# Patient Record
Sex: Female | Born: 1950
Health system: Southern US, Community
[De-identification: ages and names within clinical notes are randomized; demographics above are authoritative.]

## PROBLEM LIST (undated history)

## (undated) DIAGNOSIS — T8140XA Infection following a procedure, unspecified, initial encounter: Secondary | ICD-10-CM

## (undated) DIAGNOSIS — K6389 Other specified diseases of intestine: Secondary | ICD-10-CM

## (undated) DIAGNOSIS — I10 Essential (primary) hypertension: Secondary | ICD-10-CM

## (undated) DIAGNOSIS — M1990 Unspecified osteoarthritis, unspecified site: Secondary | ICD-10-CM

## (undated) DIAGNOSIS — IMO0002 Reserved for concepts with insufficient information to code with codable children: Secondary | ICD-10-CM

## (undated) DIAGNOSIS — E785 Hyperlipidemia, unspecified: Secondary | ICD-10-CM

## (undated) DIAGNOSIS — R791 Abnormal coagulation profile: Secondary | ICD-10-CM

## (undated) DIAGNOSIS — R1900 Intra-abdominal and pelvic swelling, mass and lump, unspecified site: Secondary | ICD-10-CM

## (undated) DIAGNOSIS — E1165 Type 2 diabetes mellitus with hyperglycemia: Secondary | ICD-10-CM

## (undated) DIAGNOSIS — I2510 Atherosclerotic heart disease of native coronary artery without angina pectoris: Secondary | ICD-10-CM

## (undated) DIAGNOSIS — Z95828 Presence of other vascular implants and grafts: Secondary | ICD-10-CM

## (undated) DIAGNOSIS — K219 Gastro-esophageal reflux disease without esophagitis: Secondary | ICD-10-CM

## (undated) HISTORY — DX: Atherosclerotic heart disease of native coronary artery without angina pectoris: I25.10

## (undated) HISTORY — DX: Infection following a procedure, unspecified, initial encounter: T81.40XA

## (undated) HISTORY — DX: Type 2 diabetes mellitus with hyperglycemia: E11.65

## (undated) HISTORY — DX: Intra-abdominal and pelvic swelling, mass and lump, unspecified site: R19.00

## (undated) HISTORY — DX: Unspecified osteoarthritis, unspecified site: M19.90

## (undated) HISTORY — DX: Essential (primary) hypertension: I10

## (undated) HISTORY — DX: Reserved for concepts with insufficient information to code with codable children: IMO0002

## (undated) HISTORY — DX: Other specified diseases of intestine: K63.89

## (undated) HISTORY — DX: Abnormal coagulation profile: R79.1

## (undated) HISTORY — DX: Hyperlipidemia, unspecified: E78.5

## (undated) HISTORY — DX: Presence of other vascular implants and grafts: Z95.828

## (undated) HISTORY — DX: Gastro-esophageal reflux disease without esophagitis: K21.9

---

## 1991-09-29 HISTORY — PX: TOTAL ABDOMINAL HYSTERECTOMY: SHX209

## 2004-08-26 ENCOUNTER — Ambulatory Visit (HOSPITAL_COMMUNITY): Admission: RE | Admit: 2004-08-26 | Discharge: 2004-08-26 | Payer: Self-pay | Admitting: Pulmonary Disease

## 2004-10-24 ENCOUNTER — Encounter: Admission: RE | Admit: 2004-10-24 | Discharge: 2004-10-24 | Payer: Self-pay | Admitting: Pulmonary Disease

## 2005-01-21 ENCOUNTER — Emergency Department (HOSPITAL_COMMUNITY): Admission: EM | Admit: 2005-01-21 | Discharge: 2005-01-21 | Payer: Self-pay | Admitting: Emergency Medicine

## 2005-07-22 ENCOUNTER — Ambulatory Visit (HOSPITAL_COMMUNITY): Admission: RE | Admit: 2005-07-22 | Discharge: 2005-07-22 | Payer: Self-pay | Admitting: Pulmonary Disease

## 2005-07-24 ENCOUNTER — Ambulatory Visit (HOSPITAL_COMMUNITY): Admission: RE | Admit: 2005-07-24 | Discharge: 2005-07-24 | Payer: Self-pay | Admitting: Pulmonary Disease

## 2006-11-25 ENCOUNTER — Encounter: Admission: RE | Admit: 2006-11-25 | Discharge: 2006-11-25 | Payer: Self-pay | Admitting: Pulmonary Disease

## 2006-11-29 ENCOUNTER — Encounter (INDEPENDENT_AMBULATORY_CARE_PROVIDER_SITE_OTHER): Payer: Self-pay | Admitting: Specialist

## 2006-11-29 ENCOUNTER — Ambulatory Visit (HOSPITAL_COMMUNITY): Admission: RE | Admit: 2006-11-29 | Discharge: 2006-11-29 | Payer: Self-pay | Admitting: *Deleted

## 2007-02-11 ENCOUNTER — Emergency Department (HOSPITAL_COMMUNITY): Admission: EM | Admit: 2007-02-11 | Discharge: 2007-02-11 | Payer: Self-pay | Admitting: Emergency Medicine

## 2007-04-08 ENCOUNTER — Ambulatory Visit (HOSPITAL_COMMUNITY): Admission: RE | Admit: 2007-04-08 | Discharge: 2007-04-08 | Payer: Self-pay | Admitting: *Deleted

## 2007-10-31 ENCOUNTER — Ambulatory Visit: Payer: Self-pay

## 2007-11-02 ENCOUNTER — Encounter: Payer: Self-pay | Admitting: Cardiology

## 2007-11-27 ENCOUNTER — Encounter: Payer: Self-pay | Admitting: Cardiology

## 2007-12-07 ENCOUNTER — Ambulatory Visit: Payer: Self-pay | Admitting: Internal Medicine

## 2007-12-07 DIAGNOSIS — E785 Hyperlipidemia, unspecified: Secondary | ICD-10-CM

## 2007-12-07 DIAGNOSIS — I10 Essential (primary) hypertension: Secondary | ICD-10-CM

## 2007-12-07 DIAGNOSIS — K219 Gastro-esophageal reflux disease without esophagitis: Secondary | ICD-10-CM

## 2007-12-07 DIAGNOSIS — E1169 Type 2 diabetes mellitus with other specified complication: Secondary | ICD-10-CM | POA: Insufficient documentation

## 2007-12-07 DIAGNOSIS — E1165 Type 2 diabetes mellitus with hyperglycemia: Secondary | ICD-10-CM

## 2007-12-07 DIAGNOSIS — I2510 Atherosclerotic heart disease of native coronary artery without angina pectoris: Secondary | ICD-10-CM

## 2007-12-07 DIAGNOSIS — I25729 Atherosclerosis of autologous artery coronary artery bypass graft(s) with unspecified angina pectoris: Secondary | ICD-10-CM | POA: Insufficient documentation

## 2007-12-07 DIAGNOSIS — E1159 Type 2 diabetes mellitus with other circulatory complications: Secondary | ICD-10-CM | POA: Insufficient documentation

## 2007-12-14 ENCOUNTER — Encounter: Payer: Self-pay | Admitting: Internal Medicine

## 2007-12-23 ENCOUNTER — Encounter: Payer: Self-pay | Admitting: Internal Medicine

## 2007-12-28 ENCOUNTER — Encounter: Payer: Self-pay | Admitting: Cardiology

## 2007-12-29 ENCOUNTER — Encounter: Payer: Self-pay | Admitting: Internal Medicine

## 2008-01-02 ENCOUNTER — Encounter: Payer: Self-pay | Admitting: Internal Medicine

## 2008-01-17 ENCOUNTER — Ambulatory Visit: Payer: Self-pay | Admitting: Internal Medicine

## 2008-01-17 LAB — CONVERTED CEMR LAB
ALT: 27 units/L (ref 0–35)
AST: 22 units/L (ref 0–37)
Albumin: 3.9 g/dL (ref 3.5–5.2)
BUN: 9 mg/dL (ref 6–23)
Basophils Absolute: 0 10*3/uL (ref 0.0–0.1)
Basophils Relative: 0 % (ref 0.0–1.0)
Calcium: 9.4 mg/dL (ref 8.4–10.5)
Cholesterol: 108 mg/dL (ref 0–200)
Creatinine, Ser: 0.7 mg/dL (ref 0.4–1.2)
Creatinine,U: 128.1 mg/dL
Eosinophils Absolute: 0.1 10*3/uL (ref 0.0–0.7)
Eosinophils Relative: 2.3 % (ref 0.0–5.0)
GFR calc non Af Amer: 91 mL/min
HCT: 44.3 % (ref 36.0–46.0)
Hemoglobin: 14.9 g/dL (ref 12.0–15.0)
Hgb A1c MFr Bld: 7.2 % — ABNORMAL HIGH (ref 4.6–6.0)
MCHC: 33.5 g/dL (ref 30.0–36.0)
MCV: 87.9 fL (ref 78.0–100.0)
Microalb, Ur: 3.6 mg/dL — ABNORMAL HIGH (ref 0.0–1.9)
Neutro Abs: 2.8 10*3/uL (ref 1.4–7.7)
RBC: 5.04 M/uL (ref 3.87–5.11)
TSH: 1.67 microintl units/mL (ref 0.35–5.50)
Total Bilirubin: 0.8 mg/dL (ref 0.3–1.2)
VLDL: 18 mg/dL (ref 0–40)
WBC: 4.6 10*3/uL (ref 4.5–10.5)

## 2008-01-26 ENCOUNTER — Ambulatory Visit: Payer: Self-pay | Admitting: Internal Medicine

## 2008-01-27 ENCOUNTER — Encounter: Payer: Self-pay | Admitting: Cardiology

## 2008-02-22 ENCOUNTER — Telehealth: Payer: Self-pay | Admitting: Internal Medicine

## 2008-02-28 ENCOUNTER — Encounter: Payer: Self-pay | Admitting: Internal Medicine

## 2008-06-05 ENCOUNTER — Ambulatory Visit: Payer: Self-pay | Admitting: Internal Medicine

## 2008-06-05 LAB — CONVERTED CEMR LAB
BUN: 11 mg/dL (ref 6–23)
CO2: 30 meq/L (ref 19–32)
Calcium: 9 mg/dL (ref 8.4–10.5)
Chloride: 108 meq/L (ref 96–112)
Creatinine, Ser: 0.7 mg/dL (ref 0.4–1.2)
Creatinine,U: 224 mg/dL
Hgb A1c MFr Bld: 6.6 % — ABNORMAL HIGH (ref 4.6–6.0)

## 2008-06-11 ENCOUNTER — Ambulatory Visit: Payer: Self-pay | Admitting: Internal Medicine

## 2008-06-11 DIAGNOSIS — R51 Headache: Secondary | ICD-10-CM

## 2008-06-11 DIAGNOSIS — R519 Headache, unspecified: Secondary | ICD-10-CM | POA: Insufficient documentation

## 2008-06-11 LAB — HM DIABETES FOOT EXAM

## 2008-07-06 ENCOUNTER — Encounter: Payer: Self-pay | Admitting: Internal Medicine

## 2008-07-19 ENCOUNTER — Ambulatory Visit: Payer: Self-pay | Admitting: Internal Medicine

## 2008-07-29 DIAGNOSIS — K6389 Other specified diseases of intestine: Secondary | ICD-10-CM

## 2008-07-29 HISTORY — PX: ABDOMINAL MASS RESECTION: SHX1110

## 2008-07-29 HISTORY — DX: Other specified diseases of intestine: K63.89

## 2008-08-12 ENCOUNTER — Encounter: Payer: Self-pay | Admitting: Internal Medicine

## 2008-08-16 ENCOUNTER — Ambulatory Visit: Payer: Self-pay | Admitting: Internal Medicine

## 2008-08-16 ENCOUNTER — Inpatient Hospital Stay (HOSPITAL_COMMUNITY): Admission: AD | Admit: 2008-08-16 | Discharge: 2008-09-13 | Payer: Self-pay | Admitting: Internal Medicine

## 2008-08-16 ENCOUNTER — Ambulatory Visit: Payer: Self-pay | Admitting: Cardiology

## 2008-08-16 DIAGNOSIS — R109 Unspecified abdominal pain: Secondary | ICD-10-CM | POA: Insufficient documentation

## 2008-08-22 ENCOUNTER — Encounter: Payer: Self-pay | Admitting: Internal Medicine

## 2008-08-22 ENCOUNTER — Encounter (INDEPENDENT_AMBULATORY_CARE_PROVIDER_SITE_OTHER): Payer: Self-pay | Admitting: General Surgery

## 2008-09-05 ENCOUNTER — Encounter: Payer: Self-pay | Admitting: Internal Medicine

## 2008-09-08 ENCOUNTER — Ambulatory Visit: Payer: Self-pay | Admitting: Internal Medicine

## 2008-09-10 ENCOUNTER — Encounter: Payer: Self-pay | Admitting: Internal Medicine

## 2008-09-11 ENCOUNTER — Telehealth: Payer: Self-pay | Admitting: Internal Medicine

## 2008-09-19 ENCOUNTER — Encounter: Payer: Self-pay | Admitting: Internal Medicine

## 2008-09-28 ENCOUNTER — Encounter: Payer: Self-pay | Admitting: Internal Medicine

## 2008-10-16 ENCOUNTER — Ambulatory Visit: Payer: Self-pay | Admitting: Internal Medicine

## 2008-10-16 DIAGNOSIS — T8140XA Infection following a procedure, unspecified, initial encounter: Secondary | ICD-10-CM

## 2008-10-16 DIAGNOSIS — R1900 Intra-abdominal and pelvic swelling, mass and lump, unspecified site: Secondary | ICD-10-CM

## 2008-10-16 LAB — CONVERTED CEMR LAB
BUN: 17 mg/dL (ref 6–23)
Basophils Relative: 0.9 % (ref 0.0–3.0)
Calcium: 8.7 mg/dL (ref 8.4–10.5)
Creatinine, Ser: 0.5 mg/dL (ref 0.4–1.2)
Eosinophils Absolute: 0.1 10*3/uL (ref 0.0–0.7)
Eosinophils Relative: 2 % (ref 0.0–5.0)
GFR calc Af Amer: 162 mL/min
GFR calc non Af Amer: 134 mL/min
Glucose, Bld: 120 mg/dL — ABNORMAL HIGH (ref 70–99)
HCT: 34 % — ABNORMAL LOW (ref 36.0–46.0)
Hemoglobin: 11.3 g/dL — ABNORMAL LOW (ref 12.0–15.0)
MCV: 89.9 fL (ref 78.0–100.0)
Monocytes Absolute: 0.4 10*3/uL (ref 0.1–1.0)
Monocytes Relative: 7.6 % (ref 3.0–12.0)
Neutro Abs: 3 10*3/uL (ref 1.4–7.7)
WBC: 4.8 10*3/uL (ref 4.5–10.5)

## 2008-10-17 ENCOUNTER — Encounter: Payer: Self-pay | Admitting: Internal Medicine

## 2008-10-17 ENCOUNTER — Telehealth: Payer: Self-pay | Admitting: Internal Medicine

## 2008-10-29 DIAGNOSIS — Z95828 Presence of other vascular implants and grafts: Secondary | ICD-10-CM

## 2008-10-29 HISTORY — DX: Presence of other vascular implants and grafts: Z95.828

## 2008-11-05 ENCOUNTER — Encounter: Payer: Self-pay | Admitting: Internal Medicine

## 2008-11-20 ENCOUNTER — Ambulatory Visit: Payer: Self-pay | Admitting: Internal Medicine

## 2008-11-20 DIAGNOSIS — R509 Fever, unspecified: Secondary | ICD-10-CM

## 2008-11-30 ENCOUNTER — Telehealth: Payer: Self-pay | Admitting: Internal Medicine

## 2008-12-10 ENCOUNTER — Encounter: Payer: Self-pay | Admitting: Internal Medicine

## 2008-12-17 ENCOUNTER — Ambulatory Visit: Payer: Self-pay | Admitting: Internal Medicine

## 2008-12-17 DIAGNOSIS — R112 Nausea with vomiting, unspecified: Secondary | ICD-10-CM | POA: Insufficient documentation

## 2008-12-17 DIAGNOSIS — R233 Spontaneous ecchymoses: Secondary | ICD-10-CM | POA: Insufficient documentation

## 2008-12-17 LAB — CONVERTED CEMR LAB
BUN: 8 mg/dL (ref 6–23)
Calcium: 9.5 mg/dL (ref 8.4–10.5)
Eosinophils Absolute: 0.2 10*3/uL (ref 0.0–0.7)
Eosinophils Relative: 3.7 % (ref 0.0–5.0)
GFR calc non Af Amer: 131.44 mL/min (ref >60)
Glucose, Bld: 103 mg/dL — ABNORMAL HIGH (ref 70–99)
HCT: 39.9 % (ref 36.0–46.0)
Lymphs Abs: 1.5 10*3/uL (ref 0.7–4.0)
MCHC: 33.4 g/dL (ref 30.0–36.0)
MCV: 85.9 fL (ref 78.0–100.0)
Monocytes Absolute: 0.3 10*3/uL (ref 0.1–1.0)
Platelets: 208 10*3/uL (ref 150.0–400.0)
Prothrombin Time: 10.3 s — ABNORMAL LOW (ref 10.9–13.3)
RDW: 13.3 % (ref 11.5–14.6)
Sodium: 143 meq/L (ref 135–145)
WBC: 4.3 10*3/uL — ABNORMAL LOW (ref 4.5–10.5)
aPTT: 34.3 s — ABNORMAL HIGH (ref 21.7–28.8)

## 2008-12-18 ENCOUNTER — Telehealth: Payer: Self-pay | Admitting: Internal Medicine

## 2008-12-18 DIAGNOSIS — R791 Abnormal coagulation profile: Secondary | ICD-10-CM | POA: Insufficient documentation

## 2008-12-19 ENCOUNTER — Encounter: Payer: Self-pay | Admitting: Internal Medicine

## 2008-12-20 ENCOUNTER — Ambulatory Visit: Payer: Self-pay | Admitting: Internal Medicine

## 2008-12-25 ENCOUNTER — Telehealth: Payer: Self-pay | Admitting: Internal Medicine

## 2009-01-04 ENCOUNTER — Encounter: Payer: Self-pay | Admitting: Internal Medicine

## 2009-01-07 ENCOUNTER — Ambulatory Visit: Payer: Self-pay | Admitting: Internal Medicine

## 2009-01-16 ENCOUNTER — Telehealth: Payer: Self-pay | Admitting: Internal Medicine

## 2009-04-11 ENCOUNTER — Telehealth: Payer: Self-pay | Admitting: Internal Medicine

## 2009-11-24 IMAGING — CT CT ABDOMEN W/ CM
2 of 6 series · 16 of 46 positions shown, 18 images · IV contrast (water/omni  & 80 ml omni 300)
Comparison: 08/16/2008.

CT ABDOMEN

CLINICAL DATA: Fever.  Abdominal pain.  Possible sepsis.
Obstruction.  History of surgery 08/22/2008.

CT ABDOMEN AND PELVIS WITH CONTRAST
TECHNIQUE: Multidetector CT imaging of the abdomen and pelvis was
performed using the standard protocol following bolus
administration of intravenous contrast.
Contrast: 80 ml [id number]-I44.  Oral contrast was administered via
nasogastric tube.

[Series 4: recon 3: routine abdomen · axial · 0.68mm/px · z∈[-413,-23]mm · 13 of 344 slices shown, 15 images]
[im 16/344  soft-tissue]
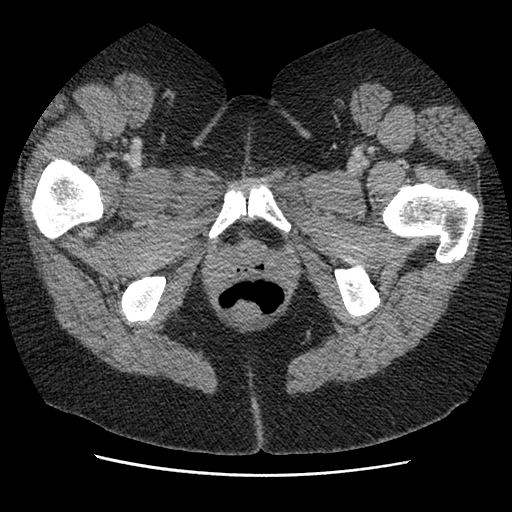
[im 16/344  bone]
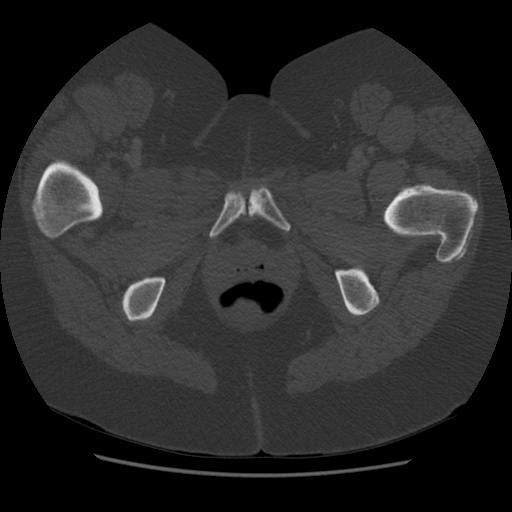
[im 47/344  soft-tissue]
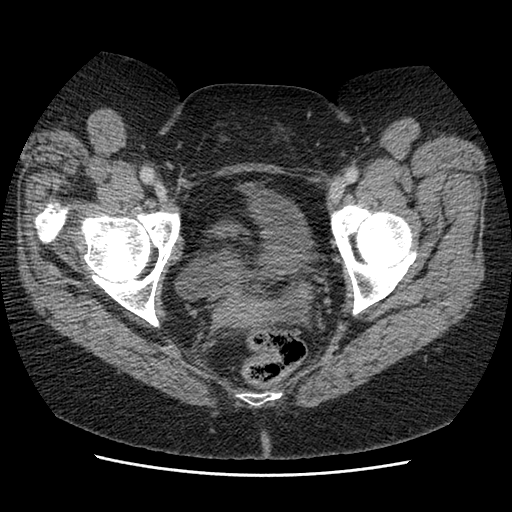
[im 78/344  soft-tissue]
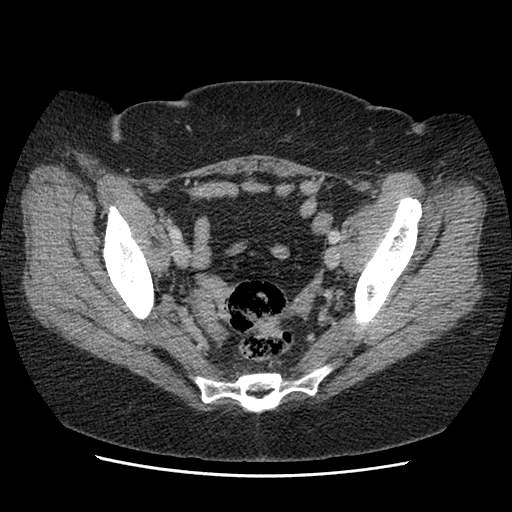
[im 94/344  soft-tissue]
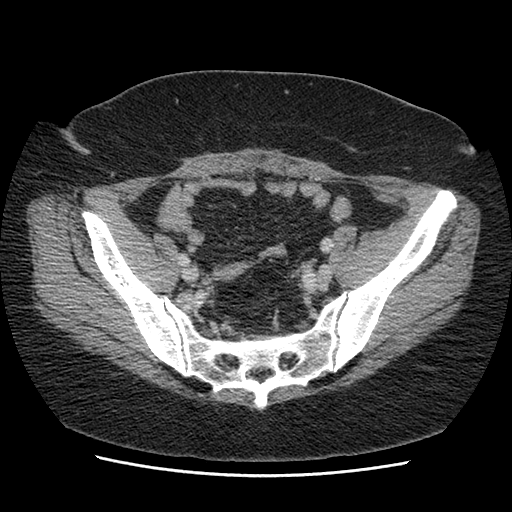
[im 125/344  soft-tissue]
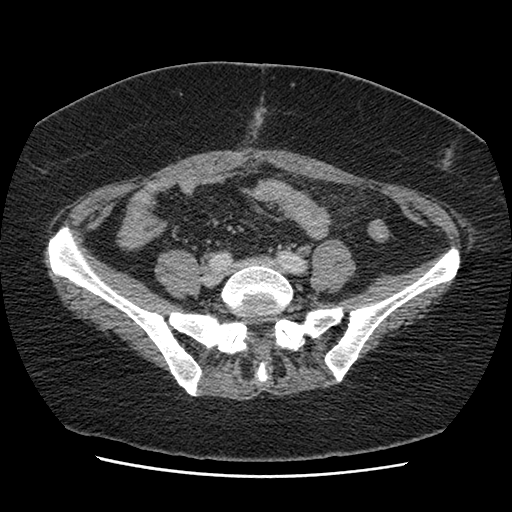
[im 141/344  soft-tissue]
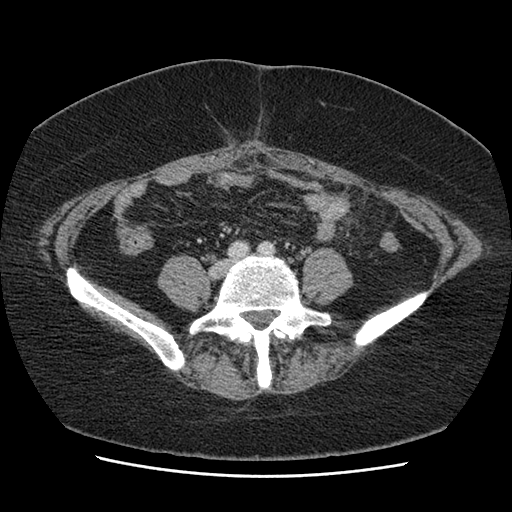
[im 172/344  soft-tissue]
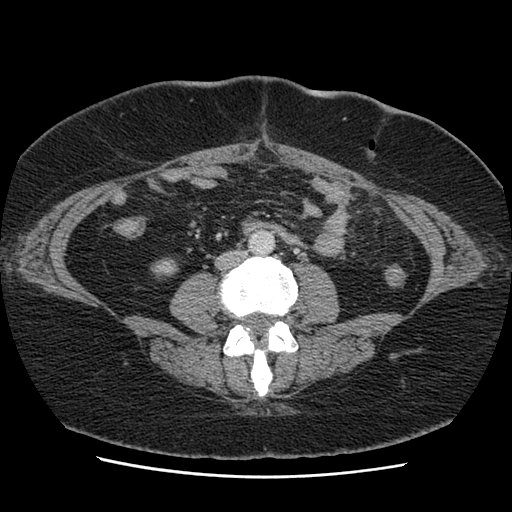
[im 203/344  soft-tissue]
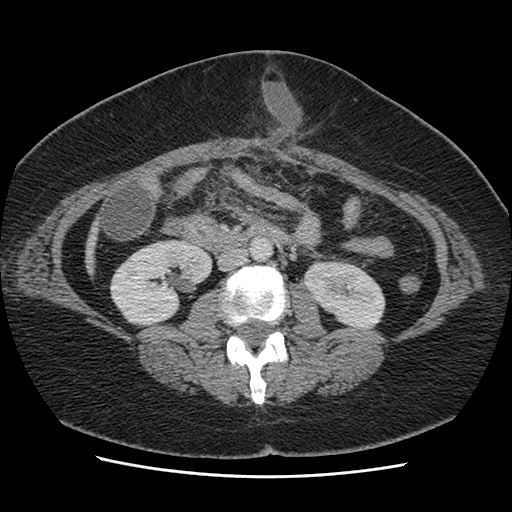
[im 219/344  soft-tissue]
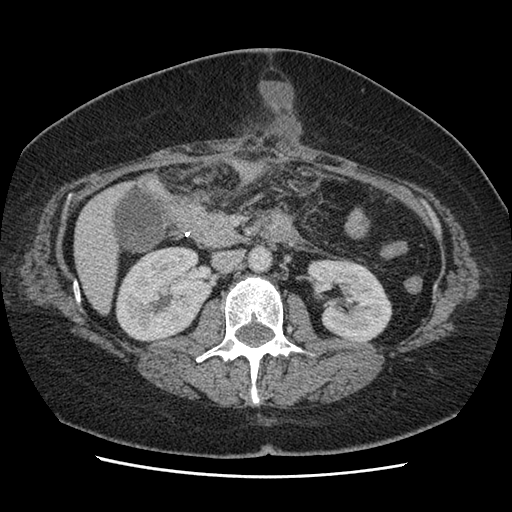
[im 219/344  bone]
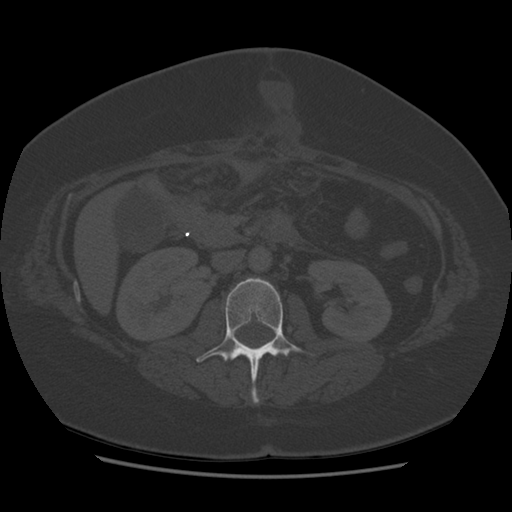
[im 250/344  soft-tissue]
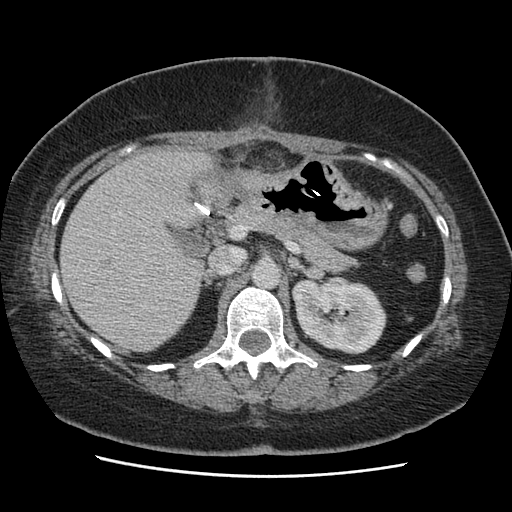
[im 266/344  soft-tissue]
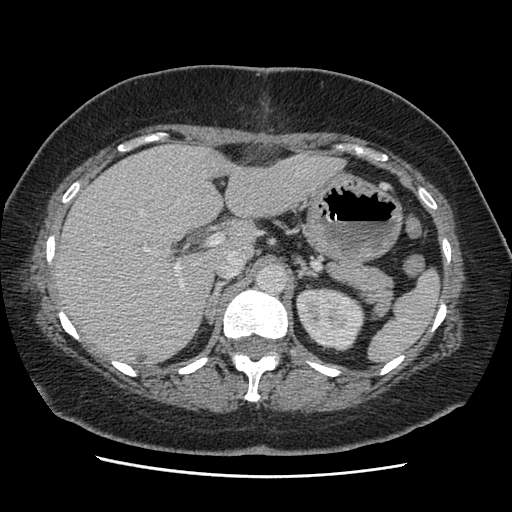
[im 297/344  soft-tissue]
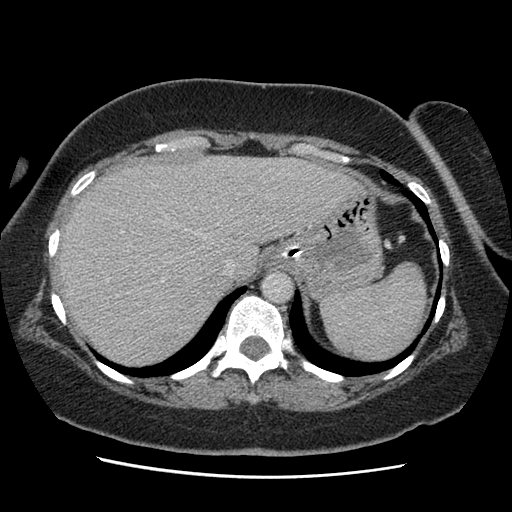
[im 328/344  soft-tissue]
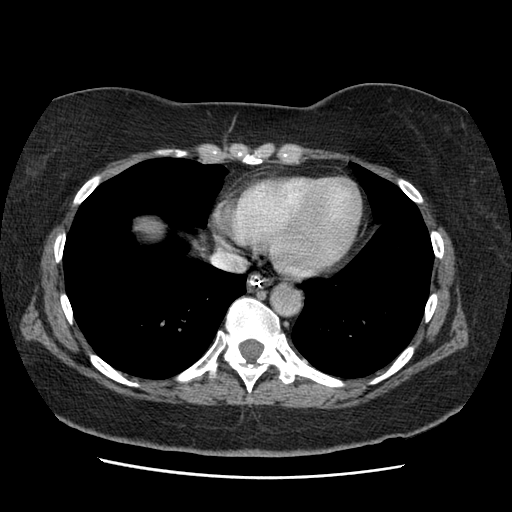

[Series 401: cor · coronal · 0.90mm/px · 3 of 93 slices shown]
[im 31/93  soft-tissue]
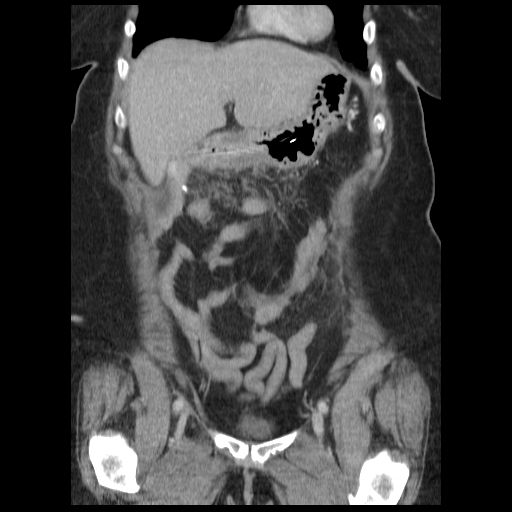
[im 41/93  soft-tissue]
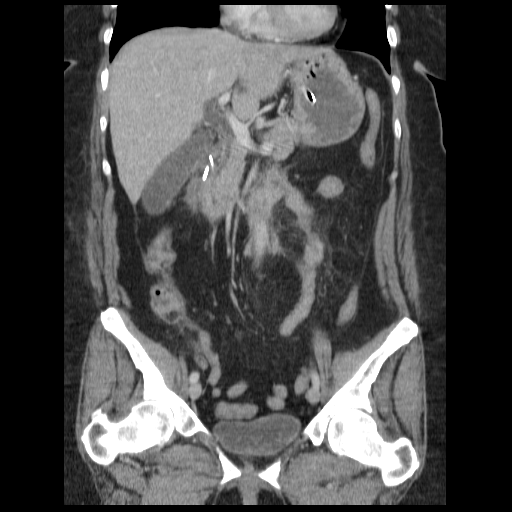
[im 52/93  soft-tissue]
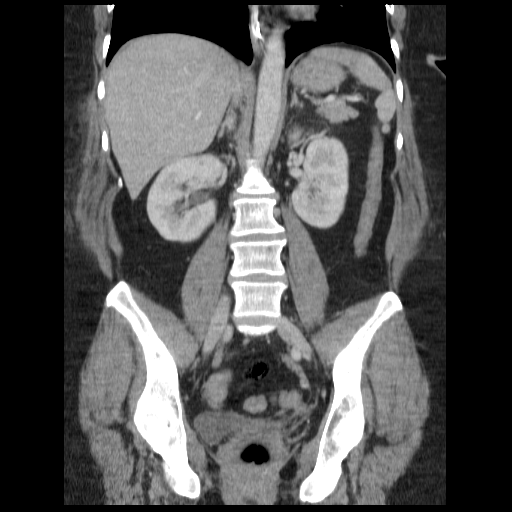

[16 of 46 positions shown; findings below may reference images not displayed]

FINDINGS: Lung bases demonstrate minimal subsegmental atelectasis
in the right lower lobe.  The liver appears within normal limits.
Nasogastric tube is present within the antrum of the stomach.
There appears to be anastomosis between the antrum of the stomach
and small bowel.  Surgical clips are associated with the second
part of the duodenum.

1.2 x 1.3 cm low density lesion is present in the posterior right
hepatic lobe on image number 22, slightly more prominent than on
preoperative exam likely due to technique.

Kidneys show normal enhancement and excretion.  The inflammatory
changes are present in the surgical bed and anterior abdominal fat.
Minim mild inflammatory stranding in the omentum.  No focal fluid
collection or abscess intra-abdominally.

5 cm AP by 2.5 cm fluid collection in the anterior subcutaneous fat
consistent with postoperative seroma.

There is been resection of the bowel mass seen on preoperative the
CT.  Stable small left adrenal nodule.  No free air is present in
the abdomen.
IMPRESSION: 1.  Postsurgical changes involving the distal stomach most
consistent with Billroth procedure or Roux en Y gastrojejunostomy.
2.  Small anterior abdominal wall subcutaneous fluid collection
likely represents seroma.
3.  Resection of small bowel mass seen on preoperative CT.
4.  No evidence of intra-abdominal abscess or focal fluid
collection.
5.  Stable left adrenal nodule.
6.  Slightly more conspicuous posterior right hepatic lobe low
density lesion.  The increased conspicuity is likely due to the
changes in technique.

CT PELVIS
FINDINGS: Moderate amount of free fluid present in the anatomic
pelvis.  Heterogeneous appearance of the ovaries bilaterally.
Hysterectomy.  Colon and pelvic small bowel appears within normal
limits.  Appendix not identified.  Bones appear within normal
limits.
IMPRESSION: 1.  Small amount of pelvic ascites.
2.  Hysterectomy.
3.  No acute pelvic abnormality.

## 2010-01-21 ENCOUNTER — Ambulatory Visit: Payer: Self-pay | Admitting: Internal Medicine

## 2010-01-21 LAB — CONVERTED CEMR LAB
ALT: 23 units/L (ref 0–35)
Albumin: 4 g/dL (ref 3.5–5.2)
BUN: 9 mg/dL (ref 6–23)
Basophils Relative: 0.5 % (ref 0.0–3.0)
Bilirubin Urine: NEGATIVE
CO2: 31 meq/L (ref 19–32)
Chloride: 106 meq/L (ref 96–112)
Cholesterol: 92 mg/dL (ref 0–200)
Creatinine, Ser: 0.7 mg/dL (ref 0.4–1.2)
Eosinophils Relative: 2.3 % (ref 0.0–5.0)
HCT: 40.5 % (ref 36.0–46.0)
Ketones, ur: NEGATIVE mg/dL
Lymphs Abs: 1.4 10*3/uL (ref 0.7–4.0)
MCV: 89.3 fL (ref 78.0–100.0)
Monocytes Absolute: 0.3 10*3/uL (ref 0.1–1.0)
Nitrite: NEGATIVE
RBC: 4.53 M/uL (ref 3.87–5.11)
Total Protein, Urine: NEGATIVE mg/dL
Total Protein: 6.7 g/dL (ref 6.0–8.3)
Triglycerides: 92 mg/dL (ref 0.0–149.0)
WBC: 5 10*3/uL (ref 4.5–10.5)
pH: 7.5 (ref 5.0–8.0)

## 2010-01-24 ENCOUNTER — Ambulatory Visit: Payer: Self-pay | Admitting: Internal Medicine

## 2010-09-05 ENCOUNTER — Encounter: Payer: Self-pay | Admitting: Internal Medicine

## 2010-10-19 ENCOUNTER — Encounter: Payer: Self-pay | Admitting: Internal Medicine

## 2010-10-28 NOTE — Miscellaneous (Signed)
Summary: Future orders--Labs  Clinical Lists Changes  Orders: Added new Test order of T-Basic Metabolic Panel [phone]) - Signed Added new Test order of T- Hemoglobin A1C ([id number]) - Signed

## 2010-10-28 NOTE — Assessment & Plan Note (Signed)
Summary: cpx   /  medication refills/hea   Vital Signs:  Patient profile:   60 year old female Height:      67 inches Weight:      216.50 pounds BMI:     34.03 O2 Sat:      100 % on Room air Temp:     98.2 degrees F oral Pulse rate:   76 / minute BP sitting:   118 / 78  (right arm) Cuff size:   large  Vitals Entered By: Lucious Groves (January 24, 2010 2:32 PM)  O2 Flow:  Room air CC: CPX and med refill./kb Is Patient Diabetic? Yes Did you bring your meter with you today? No Pain Assessment Patient in pain? no      Comments Patient states that all meds need 90 day supply/refills sent to [facility]./kb   Primary Care Provider:  Dondra Spry DO  CC:  CPX and med refill./kb.  History of Present Illness: 60 y/o AA female with hx of htn, borderline DM II,  messenteric mass, s/p resection and hyperlipidemia for routine cpx.   overall pt has been doing well no recurrent nausea or vomiting  htn - stable hyperlipidemia - stable  Current Medications (verified): 1)  Lipitor 80 Mg  Tabs (Atorvastatin Calcium) .... Take 1 Tablet By Mouth Once A Day At Bedtime 2)  Glucophage Xr 750 Mg  Tb24 (Metformin Hcl) .... One By Mouth Two Times A Day 3)  Protonix 40 Mg  Tbec (Pantoprazole Sodium) .... One By Mouth Qd 4)  Metoprolol Tartrate 25 Mg  Tabs (Metoprolol Tartrate) .... Take 1 Tablet By Mouth Two Times A Day (On Hold 10/16/08) 5)  Benazepril Hcl 20 Mg  Tabs (Benazepril Hcl) .... One By Mouth Once Daily 6)  Plavix 75 Mg  Tabs (Clopidogrel Bisulfate) .... Take 1 Tablet By Mouth Once A Day (Hold) 7)  L-Lysine 500 Mg  Tabs (Lysine) .... Take 1 Tablet By Mouth Once A Day 8)  Eql Aspirin Ec 325 Mg Tbec (Aspirin) .... One By Mouth Qd 9)  Amlodipine Besylate 5 Mg Tabs (Amlodipine Besylate) .... Take 1 Tablet By Mouth Once A Day 10)  Cyclobenzaprine Hcl 5 Mg Tabs (Cyclobenzaprine Hcl) .... One By Mouth Qhs 11)  Oxycodone-Acetaminophen 5-325 Mg Tabs (Oxycodone-Acetaminophen) .... One Tablet Every 6  Hours As Needed 12)  Zofran Odt 4 Mg Tbdp (Ondansetron) .... One Tablet By Mouth Every 6 Hours As Needed Nausea 13)  Promethazine Hcl 25 Mg Tabs (Promethazine Hcl) .Marland Kitchen.. 1 Tablet By Mouth Every 8 Hours As Needed 14)  Onetouch Ultra Test  Strp (Glucose Blood) .... 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    Line sepsis-right PICC line removed O2/2010  Past Surgical History: Hysterectomy 1993    Mesenteric mass resection 07/2008     Family History: Mother deceased age 69 - CAD, Htn Father deceased age 71 - brain tumor Sister and 2 brothers have diabetes         Social History: Occupation:  retired from [employer] Married - two grown children Never Smoked Alcohol  use-no          Physical Exam  General:  alert and overweight-appearing.   Neck:  supple, no masses, and no neck tenderness.  no carotid bruits.   Lungs:  normal respiratory effort and normal breath sounds.   Heart:  normal rate, regular rhythm, no murmur, and no gallop.   Abdomen:  soft, non-tender, normal bowel sounds, and no masses.   Extremities:  No lower extremity edema  Neurologic:  cranial nerves II-XII intact and gait normal.   Psych:  normally interactive, good eye contact, not anxious appearing, and not depressed appearing.     Impression & Recommendations:  Problem # 1:  HEALTH MAINTENANCE EXAM (ICD-V70.0) Reviewed  adult health maintenance protocols.   Colonoscopy: Done (10/11/2006) Flu Vax: Fluvax Non-MCR (07/19/2008)   Pneumovax: Pneumovax (07/19/2008) Chol: 92 (01/21/2010)   HDL: 33.20 (01/21/2010)   LDL: 40 (01/21/2010)   TG: 92.0 (01/21/2010) TSH: 2.51 (01/21/2010)   HgbA1C: 6.0 (10/16/2008)     Problem # 2:  DIABETES MELLITUS, TYPE II (ICD-250.00) monitor A1c.  Pt counseled on diet and exercise.  Her updated medication list for this problem includes:    Glucophage Xr 750 Mg Tb24 (Metformin hcl) ..... One by mouth two times a day    Benazepril Hcl 20 Mg Tabs (Benazepril hcl) ..... One by mouth once daily    Eql Aspirin Ec 325 Mg Tbec (Aspirin) ..... One by mouth qd  Labs Reviewed: Creat: 0.7 (01/21/2010)     Last Eye Exam: normal (12/19/2008) Reviewed HgBA1c results: 6.0 (10/16/2008)  6.6 (06/05/2008)  Problem # 3:  HYPERTENSION (ICD-401.9) well controlled.  Maintain current medication regimen.  The following medications were removed from the medication list:    Metoprolol Tartrate 25 Mg Tabs (Metoprolol tartrate) .Marland Kitchen... Take 1 tablet by mouth two times a day (on hold 10/16/08) Her updated medication list for this problem includes:    Benazepril Hcl 20 Mg Tabs (Benazepril hcl) ..... One by mouth once daily    Amlodipine Besylate 5 Mg Tabs (Amlodipine besylate) .Marland Kitchen... Take 1 tablet by mouth once a day  BP today: 118/78 Prior BP: 120/70 (01/07/2009)  Labs Reviewed: K+: 4.7 (01/21/2010) Creat: : 0.7 (01/21/2010)   Chol: 92 (01/21/2010)   HDL: 33.20 (01/21/2010)   LDL: 40 (01/21/2010)   TG: 92.0 (01/21/2010)  Complete Medication List: 1)  Lipitor 80 Mg Tabs (Atorvastatin calcium) .... Take 1 tablet by mouth once a day at bedtime 2)  Glucophage Xr 750 Mg Tb24 (Metformin hcl) .... One by mouth two times a day 3)  Protonix 40 Mg Tbec (Pantoprazole sodium) .... One by mouth qd 4)  Benazepril Hcl 20 Mg Tabs (Benazepril hcl) .... One by mouth once daily 5)  L-lysine 500 Mg Tabs (Lysine) ....  Take 1 tablet by mouth once a day 6)  Eql Aspirin Ec 325 Mg Tbec (Aspirin) .... One by mouth qd 7)  Amlodipine Besylate 5 Mg Tabs (Amlodipine besylate) .... Take 1 tablet by mouth once a day 8)  Ondansetron 4 Mg Tbdp (Ondansetron) .... One by mouth two times a day as needed nausea 9)  Onetouch Ultra Test Strp (Glucose blood) .... Test blood sugar tid  Patient Instructions: 1)  Please schedule a follow-up appointment in 6 months. 2)  BMP prior to visit, ICD-9: 401.9 3)  HbgA1C prior to visit, ICD-9: 250.00 4)  Please return for lab work one (1) week before your next appointment.  Prescriptions: ONETOUCH ULTRA TEST  STRP (GLUCOSE BLOOD) test blood sugar  tid  #300 x 3   Entered and Authorized by:   D. Thomos Lemons DO   Signed by:   D. Thomos Lemons DO on 01/24/2010   Method used:   Electronically to        [facility]* (mail-order)             ,          Ph: [phone]       Fax: [phone]   RxID:   [id number] AMLODIPINE BESYLATE 5 MG TABS (AMLODIPINE BESYLATE) Take 1 tablet by mouth once a day  #90 x 3   Entered and Authorized by:   D. Thomos Lemons DO   Signed by:   D. Thomos Lemons DO on 01/24/2010   Method used:   Electronically to        [facility]* (mail-order)             ,          Ph: [phone]       Fax: [phone]   RxID:   [id number] BENAZEPRIL HCL 20 MG  TABS (BENAZEPRIL HCL) one by mouth once daily  #90 x 3   Entered and Authorized by:   D. Thomos Lemons DO   Signed by:   D. Thomos Lemons DO on 01/24/2010   Method used:   Electronically to        [facility]* (mail-order)             ,          Ph: [phone]       Fax: [phone]   RxID:   [id number] PROTONIX 40 MG  TBEC (PANTOPRAZOLE SODIUM) one by mouth qd  #90 x 3   Entered and Authorized by:   D. Thomos Lemons DO   Signed by:   D. Thomos Lemons DO on 01/24/2010   Method used:   Electronically to        [facility]* (mail-order)             ,          Ph: [phone]       Fax:  [phone]   RxID:   [id number] GLUCOPHAGE XR 750 MG  TB24 (METFORMIN HCL) one by mouth two times a day  #180 x 3   Entered and Authorized by:   D. Thomos Lemons DO   Signed by:   D. Thomos Lemons DO on 01/24/2010   Method used:   Electronically to        [facility]* (mail-order)             ,          Ph: [phone]       Fax: [phone]   RxID:   [id number] LIPITOR 80 MG  TABS (ATORVASTATIN CALCIUM) Take 1 tablet by mouth once a day at bedtime  #90 x 3   Entered and Authorized by:   D. Thomos Lemons DO   Signed by:   D. Thomos Lemons DO on 01/24/2010   Method used:   Electronically to        [facility]* (mail-order)             ,          Ph: [phone]       Fax: [phone]   RxID:   [id number] ONDANSETRON 4 MG TBDP (ONDANSETRON) one by mouth two times a day as needed nausea  #60  x 2   Entered and Authorized by:   D. Thomos Lemons DO   Signed by:   D. Thomos Lemons DO on 01/24/2010   Method used:   Electronically to        [facility] (retail)       [address]       [place], [place]  [id number]       Ph: [phone]       Fax: [phone]   RxID:   [id number]

## 2010-10-30 NOTE — Letter (Signed)
Summary: [facility]  [facility]   Imported By: Lanelle Bal 09/17/2010 13:12:36  _____________________________________________________________________  External Attachment:    Type:   Image     Comment:   External Document

## 2010-12-31 ENCOUNTER — Other Ambulatory Visit: Payer: Self-pay | Admitting: Internal Medicine

## 2010-12-31 DIAGNOSIS — I10 Essential (primary) hypertension: Secondary | ICD-10-CM

## 2010-12-31 NOTE — Telephone Encounter (Signed)
rx refill for Amlodipine denied, patient is due for appointment with blood work

## 2011-01-19 ENCOUNTER — Other Ambulatory Visit: Payer: Self-pay | Admitting: *Deleted

## 2011-01-19 DIAGNOSIS — E785 Hyperlipidemia, unspecified: Secondary | ICD-10-CM

## 2011-01-19 DIAGNOSIS — I10 Essential (primary) hypertension: Secondary | ICD-10-CM

## 2011-01-19 NOTE — Telephone Encounter (Signed)
Patient called and left voice message requesting a refill on Amlodipine and Lipitor. Last Office visit was 01/24/2010. There are no future appointments on file and patient missed scheduled 6 month follow  appt.

## 2011-01-19 NOTE — Telephone Encounter (Signed)
Okay to refill times one Please inform patient - she needs office visit within one year for additional refills

## 2011-01-20 ENCOUNTER — Other Ambulatory Visit (INDEPENDENT_AMBULATORY_CARE_PROVIDER_SITE_OTHER): Payer: BC Managed Care – PPO

## 2011-01-20 DIAGNOSIS — Z0000 Encounter for general adult medical examination without abnormal findings: Secondary | ICD-10-CM

## 2011-01-20 LAB — CBC WITH DIFFERENTIAL/PLATELET
Basophils Relative: 0.9 % (ref 0.0–3.0)
Eosinophils Relative: 9.3 % — ABNORMAL HIGH (ref 0.0–5.0)
Hemoglobin: 13.9 g/dL (ref 12.0–15.0)
Lymphocytes Relative: 27.7 % (ref 12.0–46.0)
MCV: 90 fl (ref 78.0–100.0)
Neutrophils Relative %: 55.8 % (ref 43.0–77.0)
RBC: 4.61 Mil/uL (ref 3.87–5.11)
WBC: 5.3 10*3/uL (ref 4.5–10.5)

## 2011-01-20 LAB — BASIC METABOLIC PANEL
Calcium: 9.2 mg/dL (ref 8.4–10.5)
Chloride: 104 mEq/L (ref 96–112)
Creatinine, Ser: 0.7 mg/dL (ref 0.4–1.2)
Sodium: 142 mEq/L (ref 135–145)

## 2011-01-20 LAB — URINALYSIS
Ketones, ur: NEGATIVE
Specific Gravity, Urine: 1.015 (ref 1.000–1.030)
Total Protein, Urine: NEGATIVE
Urine Glucose: NEGATIVE
Urobilinogen, UA: 0.2 (ref 0.0–1.0)
pH: 6 (ref 5.0–8.0)

## 2011-01-20 LAB — LIPID PANEL
LDL Cholesterol: 44 mg/dL (ref 0–99)
Total CHOL/HDL Ratio: 2
Triglycerides: 65 mg/dL (ref 0.0–149.0)

## 2011-01-20 LAB — HEPATIC FUNCTION PANEL
ALT: 31 U/L (ref 0–35)
Alkaline Phosphatase: 110 U/L (ref 39–117)
Bilirubin, Direct: 0.1 mg/dL (ref 0.0–0.3)
Total Bilirubin: 0.7 mg/dL (ref 0.3–1.2)

## 2011-01-20 MED ORDER — AMLODIPINE BESYLATE 5 MG PO TABS: 5.0000 mg | ORAL_TABLET | Freq: Every day | ORAL | Status: DC

## 2011-01-20 MED ORDER — ATORVASTATIN CALCIUM 80 MG PO TABS: 80.0000 mg | ORAL_TABLET | Freq: Every day | ORAL | Status: DC

## 2011-01-20 NOTE — Telephone Encounter (Signed)
Call placed to patient at [phone], no answer. A detailed voice message was left informing patient per Dr Artist Pais instructiions. Message was left informing patient 30 day supply went to local pharmacy

## 2011-01-25 ENCOUNTER — Encounter: Payer: Self-pay | Admitting: Internal Medicine

## 2011-01-26 ENCOUNTER — Other Ambulatory Visit: Payer: Self-pay | Admitting: Internal Medicine

## 2011-01-26 DIAGNOSIS — E119 Type 2 diabetes mellitus without complications: Secondary | ICD-10-CM

## 2011-01-26 NOTE — Telephone Encounter (Signed)
Rx refill sent to pharmacy. 

## 2011-01-27 ENCOUNTER — Ambulatory Visit (INDEPENDENT_AMBULATORY_CARE_PROVIDER_SITE_OTHER): Payer: BC Managed Care – PPO | Admitting: Internal Medicine

## 2011-01-27 ENCOUNTER — Encounter: Payer: Self-pay | Admitting: Internal Medicine

## 2011-01-27 ENCOUNTER — Other Ambulatory Visit: Payer: Self-pay | Admitting: Internal Medicine

## 2011-01-27 DIAGNOSIS — I10 Essential (primary) hypertension: Secondary | ICD-10-CM

## 2011-01-27 DIAGNOSIS — E119 Type 2 diabetes mellitus without complications: Secondary | ICD-10-CM

## 2011-01-27 DIAGNOSIS — E785 Hyperlipidemia, unspecified: Secondary | ICD-10-CM

## 2011-01-27 DIAGNOSIS — R51 Headache: Secondary | ICD-10-CM

## 2011-01-27 DIAGNOSIS — Z0000 Encounter for general adult medical examination without abnormal findings: Secondary | ICD-10-CM

## 2011-01-27 LAB — HM MAMMOGRAPHY

## 2011-01-27 MED ORDER — AMLODIPINE BESYLATE 5 MG PO TABS: 5.0000 mg | ORAL_TABLET | Freq: Every day | ORAL | Status: DC

## 2011-01-27 MED ORDER — ATORVASTATIN CALCIUM 80 MG PO TABS: 80.0000 mg | ORAL_TABLET | Freq: Every day | ORAL | Status: DC

## 2011-01-27 NOTE — Progress Notes (Signed)
Subjective:    Patient ID: Terri [name], female    DOB: 11-17-1950, 60 y.o.   MRN: [id number]  Diabetes She presents for her follow-up diabetic visit. She has type 2 diabetes mellitus. Her disease course has been stable. There are no hypoglycemic associated symptoms. Pertinent negatives for diabetes include no blurred vision, no polydipsia, no polyphagia and no visual change. Symptoms are stable. There are no diabetic complications. Risk factors for coronary artery disease include diabetes mellitus, dyslipidemia and hypertension. Current diabetic treatment includes oral agent (monotherapy). She is compliant with treatment most of the time. Her weight is stable. She is following a generally healthy diet. An ACE inhibitor/angiotensin II receptor blocker is being taken.   60 y/o AA female to hs of DM II for follow up.  Int hx:  Had f/u stress test at [facility].  Stress test reported normal. Cardiologist - Dr. Dorrene German  Left sided headached 3 x per week.  Symptoms can be 8 to 9 out of 10.   Headache has caused sleep disturbance. Pt has been using excedrin.  occ taking aleve.  No nausea.  No photophobia.  Review of Systems  Eyes: Negative for blurred vision.  Hematological: Negative for polydipsia and polyphagia.       Past Medical History  Diagnosis Date  . Abnormal coagulation profile   . Spontaneous ecchymoses   . Fever, unspecified   . Routine general medical examination at a health care facility   . Other postoperative infection   . Abdominal mass   . Abdominal pain   . Headache   . Hypertension   . Hyperlipidemia   . GERD (gastroesophageal reflux disease)   . CAD (coronary artery disease)   . Diabetes mellitus type II, uncontrolled   . Mesenteric mass 07/2008    complicated by nausea & vomiting  . S/P PICC central line placement 10/2008    line sepsis-right PICC line removed     History   Social History  . Marital Status: Married    Spouse Name: N/A    Number  of Children: N/A  . Years of Education: N/A   Occupational History  . Not on file.   Social History Main Topics  . Smoking status: Never Smoker   . Smokeless tobacco: Not on file  . Alcohol Use: Not on file  . Drug Use: Not on file  . Sexually Active: Not on file   Other Topics Concern  . 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 Total abdominal hysterectomy 1993  . Abdominal mass resection 07/2008    Mesentertic Mass resection    Family History  Problem Relation Age of Onset  . Coronary artery disease Mother   . Hypertension Mother   . Brain cancer Father 19    deceased  . Diabetes Sister   . Diabetes Brother      X 2    No Known Allergies  Current Outpatient Prescriptions on File Prior to Visit  Medication Sig Dispense Refill  . amLODipine (NORVASC) 5 MG tablet Take 1 tablet (5 mg total) by mouth daily.  30 tablet  0  . aspirin 325 MG tablet Take 325 mg by mouth daily.        Marland Kitchen atorvastatin (LIPITOR)  80 MG tablet Take 1 tablet (80 mg total) by mouth daily.  30 tablet  0  . benazepril (LOTENSIN) 20 MG tablet Take 20 mg by mouth daily.        Marland Kitchen glucose blood (ONE TOUCH TEST STRIPS) test strip 1 each by Other route. Use as instructed to check blood sugar three times daily       . Lysine HCl 500 MG TABS Take 500 mg by mouth daily.        . metFORMIN (GLUCOPHAGE-XR) 750 MG 24 hr tablet TAKE 1 TABLET TWICE A DAY  180 tablet  0  . ondansetron (ZOFRAN) 4 MG tablet Take 4 mg by mouth 2 (two) times daily as needed. For nausea       . pantoprazole (PROTONIX) 40 MG tablet Take 40 mg by mouth daily.          BP 110/80  Pulse 69  Temp(Src) 98.1 F (36.7 C) (Oral)  Resp 18  Ht 5\' 7"  (1.702 m)  Wt 215 lb (97.523 kg)  BMI 33.67 kg/m2  SpO2 99%    Objective:   Physical Exam     Constitutional: Appears well-developed  and well-nourished. No distress.  Neck: Normal range of motion. Neck supple. No thyromegaly present. No carotid bruit Cardiovascular: Normal rate, regular rhythm and normal heart sounds.  Exam reveals no gallop and no friction rub.   No murmur heard. Pulmonary/Chest: Effort normal and breath sounds normal.  No wheezes. No rales.  Abdominal: Soft. Bowel sounds are normal. No mass. There is no tenderness.  Neurological: Alert. No cranial nerve deficit.  Skin: Skin is warm and dry.  Psychiatric: Normal mood and affect. Behavior is normal.   Assessment & Plan:

## 2011-01-27 NOTE — Telephone Encounter (Signed)
Refill was provided for pt at appt today.

## 2011-01-28 LAB — HEMOGLOBIN A1C
Hgb A1c MFr Bld: 6.4 % — ABNORMAL HIGH (ref <5.7)
Mean Plasma Glucose: 137 mg/dL — ABNORMAL HIGH (ref <117)

## 2011-02-06 ENCOUNTER — Encounter: Payer: BC Managed Care – PPO | Admitting: Family

## 2011-02-10 NOTE — Discharge Summary (Signed)
NAME:  Terri [name], Terri [name]               ACCOUNT NO.:  [id number]   MEDICAL RECORD NO.:  [id number]           PATIENT TYPE:   LOCATION:                                 FACILITY:   PHYSICIAN:  Valerie A. Felicity Coyer, MDDATE OF BIRTH:  [birth year]   DATE OF ADMISSION:  DATE OF DISCHARGE:                               DISCHARGE SUMMARY   DIAGNOSES AT THE TIME OF DISCHARGE:  1. Status post mesenteric mass resection, pathology consistent with      desmoid fibromatosis.  The patient with persistent postoperative      ileus, felt initially secondary to pyloric edema per endoscopy,      September 03, 2008, now without significant passage of barium into      the small bowel per upper gastrointestinal series, performed      September 10, 2008.  2. Hypertension.  3. Acute blood loss anemia secondary to mesenteric mass resection.  4. History of coronary artery disease.  5. Dyslipidemia.  6. Gastroesophageal reflux disease.  7. Debilitation secondary to prolonged illness.   HISTORY OF PRESENT ILLNESS:  Terri [name] is a 60 year old African  American female who was admitted on August 16, 2008, with chief  complaint of fever and abdominal pain.  She has a history of coronary  artery disease and diabetes type 2 who underwent evaluation at [facility] on August 12, 2008, for chest tightness and  fever.  She was evaluated and diagnosed with influenza, at that time was  given Tamiflu.  Her workup included blood cultures, 1 of the 2 blood  cultures came back positive for Gram-negative rods at that time.  Her  initial urinalysis was positive for leukocytes.  Her culture eventually  showed Klebsiella pneumoniae.  Over the last 24-48 hours prior to  admission, the patient reported headache, severe stomach pain, nausea,  and intermittent chills.  Her abdominal pain was localized in the mid  abdomen.  She was admitted for further evaluation and treatment.   PAST MEDICAL HISTORY:  1. Coronary  artery disease.  2. Diabetes, type 2.  3. GERD.  4. Hyperlipidemia.  5. Hypertension.   COURSE OF HOSPITALIZATION:  1. Mesenteric mass:  The patient was admitted.  The initial imaging      studies included CT of the abdomen and pelvis, which showed now      regular soft tissue mass in the left aspect of the mesentery which      resulted in a partial proximal small bowel obstruction.  There was      no evidence of intra-abdominal metastasis or abscess.  Surgical      consult was requested, and the patient was seen initially in      consultation by Dr. Violeta Gelinas for this abdominal mass.  He      recommended a Cardiology evaluation for preop clearance and that      Plavix be held for care of Cardiology.  She subsequently underwent      resection of the distal duodenum and proximal jejunum with a      retrocolic jejunoduodenostomy and oversew of  the duodenum stump.      This was performed by Dr. Jimmye Norman.  Postoperatively, she had      issues with persistent ileus and has been intolerant of NG tube      clamping.  She was also seen in consultation during this admission      by Dr. Sabino Gasser of Gastroenterology.  He performed an upper      endoscopy on September 03, 2008, which showed distal gastric swelling      without stricture.  The gastrojejunostomy was noted to be fairly      open, and he felt at that time that if should improve with      improving edema of the gastric wall.  The patient persisted with      clinical signs of ileus and inability to tolerate NG tube clamping.      She underwent upper GI series which was performed on September 10, 2008, and noted that the barium preferentially flows through the      pyloroplasty and what appears to be a blind loop of the descending      duodenum and only a small amount of barium entered the small bowel      loop.  These findings raised the question of need for surgical      revision.  Upon discussion with the family, their  request at this      time is for transfer to [facility].  Initial      contact has been made by Dr. Jimmye Norman.  We are awaiting further      word from their center and hope for transfer.   As noted, the patient had positive blood cultures at [facility] on previous admission.  Blood cultures which were  performed here were negative x3 with 1 bottle growing coag-negative  staph which was felt to be contamination.  She was treated  with  antibiotics and these were discontinued postoperatively.   We did ask [facility] to follow back up with the patient in  regards to her cardiac status for surgery.  They recommended continuing  on IV Lopressor 5 mg q.6 h. Perioperatively.  Of note, Plavix remains on  hold since prior to her surgery and as the patient improves hopefully  after followup revision, will need consideration for resuming this  medication at a later date.   PHYSICAL EXAMINATION:  VITAL SIGNS:  BP 120/77, heart rate 104,  respiratory rate 18, temperature 97.8, O2 sat 100% on room air.  GENERAL:  The patient is a pleasant 60 year old female who appears  somewhat depressed but in no acute distress.  HEENT:  ENT, she has an NG tube to intermittent low wall suction,  draining a yellow bilious drainage.  Head, normocephalic, atraumatic.  CARDIOVASCULAR:  S1 and S2.  Regular rate and rhythm is noted.  No  significant lower extremity edema is noted.  RESPIRATORY:  Breath sounds are clear to auscultation bilaterally  without wheezes, rales, or rhonchi.  ABDOMEN:  Mildly distended.  No bowel sounds are noted at this time.  She is nontender.  PSYCHIATRIC:  She has somewhat flat affect but is pleasant and calm.  She is alert and oriented x3.  NEUROLOGIC:  She is moving all extremities.  Speech is clear.   PERTINENT LABORATORIES:  At the time of discharge, hemoglobin 11.9,  hematocrit 36.6.  BUN 27, creatinine 0.87, sodium 131.  DISPOSITION:  Anticipate transfer to [facility],  pending their acceptance of this patient.   MEDICATIONS AT THE TIME OF DISCHARGE:  1. Lantus insulin 10 units subcu b.i.d.  2. NovoLog sliding scale q.4 h.  3. Protonix 40 mg IV q.12 h.  4. Lopressor 5 mg p.o. IV q.6 h.  Of note, this was held for several      days as the patient felt that this was contributing to her      weakness.  By Cardiology's recommendations, we will resume this      medication with close monitoring and parameters for holding based      on blood pressure.  5. Lovenox 40 mg subcu daily.  6. TNA to be dosed per pharmacy while the patient is n.p.o.  7. D5/half NS at 50 mL/hour.  8. SCDs for DVT prophylaxis.  9. Cepacol lozenges q.2 h. p.r.n.  10.Sodium chloride nasal spray q.1 h. p.r.n. soreness and dryness of      the nasal mucosa.  11.Benadryl 12.5-25 mg IV q.6 h. p.r.n.  12.Ambien 5 mg p.o. nightly p.r.n.  13.Zofran 4 mg IV q.4 h. p.r.n.  14.Valium 1-2 mg IM q.6 h. p.r.n.  15.Dilaudid 0.5-1 mg IV q.3 h. p.r.n.   DIET:  At the time of discharge, the patient was n.p.o., currently on  TNA.   ACTIVITY:  Out of bed with assist.   FOLLOWUP:  Upon discharge and as needed, she will need to follow up with  her primary care Terri [name] at [facility], Terri [name].      Sandford Craze, NP      Raenette Rover. Felicity Coyer, MD  Electronically Signed    MO/MEDQ  D:  09/12/2008  T:  09/12/2008  Job:  [id number]   cc:   Barbette Hair. Artist Pais, DO

## 2011-02-10 NOTE — Op Note (Signed)
NAMELAKEYIA, SURBER               ACCOUNT NO.:  [id number]   MEDICAL RECORD NO.:  [id number]          PATIENT TYPE:  INP   LOCATION:  5148                         FACILITY:  [facility]   PHYSICIAN:  Cherylynn Ridges, M.D.    DATE OF BIRTH:  12-Oct-1950   DATE OF PROCEDURE:  08/22/2008  DATE OF DISCHARGE:                               OPERATIVE REPORT   PREOPERATIVE DIAGNOSIS:  Obstructing proximal mesenteric small bowel  mass.   POSTOPERATIVE DIAGNOSIS:  Obstructing proximal mesenteric small bowel  mass.   PROCEDURE:  Resection of the distal duodenum and proximal jejunum with a  retrocolic jejunoduodenostomy and oversew of duodenum stump.   SURGEON:  Cherylynn Ridges, MD   ASSISTANT:  Ollen Gross. Vernell Morgans, MD   ANESTHESIA:  General endotracheal.   ESTIMATED BLOOD LOSS:  300 mL.   COMPLICATIONS:  None.   CONDITION:  Fair.   INDICATIONS FOR OPERATION:  The patient is a 60 year old diabetic who  came in with proximal bowel obstruction and abdominal pain with a CT  scan showing growth at the base of the mesentery of the proximal small  bowel.  There was obstructing loops of small bowel proximally, and she  came in to Surgery after decompression and evaluation.   FINDINGS:  The patient's mesenteric mass involved the and proximal 2  feet of jejunum and distal portion of the duodenum at the ligament of  Treitz.  It was growing  into the ligament of Treitz and the small bowel  bed area which had to be resected.  We did appear to get completely  around it.  There was no evidence of any hepatic, splenic, or other  bowels involvement.   OPERATION:  The patient was taken to the operating room and placed on  table in supine position.  After an adequate general endotracheal  anesthetic was administered, she was prepped and draped in the usual  sterile manner exposing the midline of the abdomen.   A midline incision was made using a #10 blade, taken down from the above  to below the umbilicus  approximately 20 cm long.  It was taken down  through the midline fascia.  We entered the peritoneal cavity carefully  with electrocautery and opening the incision fully at the fascial level.   With the Balfour retractor in place, we were able to palpate this  proximal small bowel mass.  There were several adhesions to this area.  However, upon mobilizing, we could see that it involves.  We isolated  the proximal and distal extended it and used GIA 55 staples to come  across the small bowel, then took the mesentery down to the base of the  tumor, which grossly involved, as I mentioned previously the ligament of  Treitz.  Then, we saw that growth going into the ligament of Treitz.  We  resected that part leaving the distal portion of the duodenum open and  draining of a small amount of bilious effluent.   We closed off this area using a 2-layered oversew closure with a running  mucosal stitch of 3-0 Vicryl and  then oversewed it with 2-0 silk serosal  stitches.  Once this was done, we irrigated with copious amounts of  saline and brought the bowel up through the retrocolic area near the  right side.  We performed a Kocher maneuver in order to mobilize the  second and third portion of the duodenum, then at the pylorus we made a  gastrotomy and duodenotomy using electrocautery and did a side-to-side  anastomosis of the jejunum, hence I am using 3-0 slk Lembert stitches of  running 3-0 Vicryl stitch with a canal stitch anteriorly and then  anterior Lembert stitch.  The anastomosis was completed.  In the  retrocolic mesentery, we tacked the bowel to the mesentery itself to  prevent motion and obstruction left it loose on the part above the left  colon.   Once the anastomosis was completed, there was some bleeding around the  second portion of the duodenum, which was controlled with a Hemoclip  suture ligature of 3-0 silk and also Surgicel.  We irrigated with  saline.  Bleeding was  controlled.  We placed a 19-mm Blake drain at the  area of the distal duodenum stump which we did decompress with the  gastrojejunostomy and the jejunoduodenostomy.  We brought the [facility]  drain out the left lower quadrant and secured it in place with 3-0  Vicryl.  All counts were correct.  We closed using looped #1 PDS and  skin with staples.  All counts were correct.      Cherylynn Ridges, M.D.  Electronically Signed     JOW/MEDQ  D:  08/22/2008  T:  08/23/2008  Job:  [id number]   cc:   Barbette Hair. Artist Pais, DO

## 2011-02-10 NOTE — Op Note (Signed)
Terri [name], LEHENBAUER NO.:  [id number]   MEDICAL RECORD NO.:  [id number]          PATIENT TYPE:  INP   LOCATION:  5148                         FACILITY:  [facility]   PHYSICIAN:  Georgiana Spinner, M.D.    DATE OF BIRTH:  [birth year]   DATE OF PROCEDURE:  DATE OF DISCHARGE:                               OPERATIVE REPORT   PROCEDURE:  Upper endoscopy.   INDICATIONS:  Gastric outlet obstruction.   ANESTHESIA:  Fentanyl 75 mcg, Versed 8.5 mg.   PROCEDURE:  With the patient mildly sedated in the left lateral  decubitus position, the Pentax videoscopic pediatric endoscope was  inserted in to the mouth, passed under direct vision through the  esophagus, which was normal, fundus and body appeared normal, and we  advanced distally.  This area was somewhat edematous and collapsed on  the NG tube, but we followed the NG tube and it looked as if we were  entering into the small intestine through the pylorus.  We followed it a  short way to verify that, and in fact it was in the pylorus, and I  pulled back and found a second lumen superior into the right of this,  which appeared patent.  There was some area of erythema and localized  inflammation at the junction with the stomach, but it appeared to be  fairly open.  I was easily able to get into it a number of times with  the pediatric scope.  I did not try an adult scope.  Once I had felt  fairly confident that there were in fact 2 lumens and I pulled the  endoscope out, did not perform a retroflex view because I did not want  to lose the NG tube, and we pushed on the NG tube to advance it as we  withdrew the endoscope.  The patient's vital signs and pulse oximetry  remained stable.  The patient tolerated the procedure well without  apparent complications.   FINDINGS:  Distal gastric swelling, fairly marked actually, but no  stricture and the gastrojejunostomy was fairly open.  I expect it should  improve just with improvement  in the edema of the gastric wall.           ______________________________  Georgiana Spinner, M.D.     GMO/MEDQ  D:  09/03/2008  T:  09/04/2008  Job:  [id number]   cc:   Cherylynn Ridges, M.D.

## 2011-02-10 NOTE — Consult Note (Signed)
NAME:  Terri [name], Terri [name]               ACCOUNT NO.:  [id number]   MEDICAL RECORD NO.:  [id number]          PATIENT TYPE:  INP   LOCATION:  2926                         FACILITY:  [facility]   PHYSICIAN:  Jesse Sans. Wall, MD, FACCDATE OF BIRTH:  Dec 29, 1950   DATE OF CONSULTATION:  08/17/2008  DATE OF DISCHARGE:                                 CONSULTATION   PRIMARY CARE PHYSICIAN:  Barbette Hair. Artist Pais, DO   REASON FOR CONSULTATION:  Preop clearance.   HISTORY OF PRESENT ILLNESS:  The patient is a 60 year old woman admitted  with mesenteric mass.  She presented 1 day ago with few weeks history of  abdominal pain, nausea, and vomiting.  Her CT scan of abdomen and pelvis  revealed a mesenteric mass for which she is undergoing an operation.  She has a history of coronary artery disease diagnosed in January 2009  in [place].  At that time, she had had an 80% LAD lesion with normal left  main, 10% ostial lesion at the D2, 10% proximal left circumflex lesion,  and normal ICA with ejection fraction of 55%.  She did not undergo any  intervention and medical treatment had been recommended.  She is  symptomatically doing fine.  There is a recent admission to [facility] with  chest discomfort about 5 days ago, at which time she was ruled out for  acute coronary syndrome and was treated for influenza.  She does not  have any other episodes of chest pain between the time of her diagnosis  of coronary artery disease and now.  She has good functional status with  no chest pain, shortness of breath, peripheral edema, and she is okay  with normal activities of daily living.   ALLERGIES:  She does not have any known allergies.   PAST MEDICAL HISTORY:  1. Coronary artery disease with catheterization in January 2009 with      results as mentioned above.  2. Hypertension.  3. Hyperlipidemia.  4. Diabetes mellitus.  5. Arthritis.  6. GERD.   CURRENT MEDICATIONS:  1. Flagyl 500 mg t.i.d.  2. Fortaz 1 g q.12.  3. Lotensin  20 mg daily, on hold.  4. Lopressor 25 b.i.d.  5. Norvasc 5 mg daily.  6. Plavix 75 mg once daily.  7. Lovenox for DVT prophylaxis.  8. Metformin 750 twice a day, on hold.  9. Normal saline at 150 per hour.  10.She is also on p.r.n. Zofran, Phenergan.  11.She is also on potassium supplements.   SOCIAL HISTORY:  She lives with her husband.  She does not have any  history of tobacco abuse, alcohol abuse, or drug abuse.   FAMILY HISTORY:  There is a history of coronary artery disease in her  mother who passed away at the age of 21 and her father passed away at 7  of brain tumor.   REVIEW OF SYSTEMS:  CONSTITUTIONAL:  Positive for fevers.  HEAD, EYES,  ENT:  Negative for headache or any visual or hearing problem.  SKIN:  Negative.  CARDIOPULMONARY:  Negative except for HPI.  GENITOURINARY:  Negative.  NEUROPSYCHIATRIC:  Negative.  MUSCULOSKELETAL:  Negative.  GASTROINTESTINAL:  Positive for nausea and vomiting and abdominal pain.  ENDOCRINE:  Negative.   PHYSICAL EXAMINATION:  VITAL SIGNS:  Temperature 100.2, pulse 93 with  ranges being 86-104, respiratory rate 20, blood pressure 101/60,  saturation 96% on room air.  GENERAL:  NAD.  HEAD, EYES, ENT:  Normocephalic, atraumatic.  PERRLA, EOMI.  Moist  mucous membranes.  Oropharynx without erythema or exudates.  NECK:  Supple without lymphadenopathy or JVD.  CARDIOVASCULAR:  Regular rate and rhythm with normal heart sounds  without murmurs, rubs, or gallops.  LUNGS:  Clear to auscultation without wheezes, rhonchi, or rales.  SKIN:  No rash.  ABDOMEN:  Soft but marked tenderness throughout with no rebound or  guarding and exaggerated bowel sounds.  EXTREMITIES:  No cyanosis, clubbing, or edema.  NEURO:  Alert and oriented x3.  Cranial nerves II through XII intact.   LABORATORY DATA:  1. Chest x-ray, no active cardiopulmonary disease.  2. Electrocardiogram, rate 102, rhythm sinus, left axis deviation with      poor progression  without any evidence of acute ischemic changes.  3. Hemoglobin 14.3, hematocrit 42, WBCs 9.2, platelet 215.  Sodium 39,      potassium 3.1, chloride 100, bicarbonate 27, BUN 8, creatinine 0.7.      Blood glucose 162.  Total bilirubin 1.2, alkaline phosphatase 150,      AST 53, ALT 85, total protein 7.4, albumin 4.2.  Cardiac enzymes      negative x3.  PT 13.1, INR 1.0.   ASSESSMENT AND PLAN:  This is a 60 year old African American woman  admitted with mesenteric mass scheduled for elective operation.  Her  risks factors for adverse cardiovascular outcome includes history of  ischemic heart disease and diabetes mellitus and she is undergoing an  intermediate risk procedure, i.e., intraperitoneal surgery.  Her risk of  having an adverse cardiovascular outcome ranges from 2.2-6.6%.  However,  the patient is symptomatically doing fine without any active complaints,  hence at this point, we do not recommend any new tests to investigate  her left ventricular function.  She should benefit from perioperative  beta-blocker, so we are going to increase her Lopressor to 37.5 mg twice  a day to target resting heart rate of 60-70 beats per minute.  Once she  is  placed n.p.o., she can be given Lopressor 5 mg IV q.4 h.  We recommend  holding her Norvasc for the time being and also to discontinue her  Plavix, as she is going to have an operation soon.  We also recommend  repleting her potassium per the primary team.      Zara Council, MD  Electronically Signed      Jesse Sans. Daleen Squibb, MD, [facility]  Electronically Signed    AS/MEDQ  D:  08/17/2008  T:  08/17/2008  Job:  [id number]

## 2011-02-10 NOTE — Consult Note (Signed)
[name], [name]               ACCOUNT NO.:  [id number]   MEDICAL RECORD NO.:  [id number]          PATIENT TYPE:  INP   LOCATION:  2926                         FACILITY:  [facility]   PHYSICIAN:  Terri Dare. Janee [name], M.D.DATE OF BIRTH:  [birth year]   DATE OF CONSULTATION:  DATE OF DISCHARGE:                                 CONSULTATION   CHIEF COMPLAINT:  Abdominal mass.   HISTORY OF PRESENT ILLNESS:  I was asked to see this pleasant 60-year-  old [place] American female for surgical consultation in regards to an  abdominal mass by Dr. Kizzie Bane.  She complains of some vague abdominal  pain, nausea, and vomiting as well as intermittent fever and chills for  the past week or so.  She was seen on the 15th of this month at [facility] and  diagnosed with influenza.  She was sent home on Tamiflu.  Her symptoms  have progressively worsened, and she was evaluated at [facility] and  referred for CT scan of the abdomen and pelvis today.  This demonstrates  a 6-cm left mesenteric mass within the small bowel mesentery.  This is  causing partial small bowel obstruction.  She has had 1 episode of  vomiting but that is better now that she has received antinausea  medication.  She continues to have some mild abdominal pain.   PAST MEDICAL HISTORY:  1. Coronary artery disease with myocardial infarction earlier this      year.  2. Type 2 diabetes.  3. GERD.  4. Hyperlipidemia.  5. Hypertension.   PAST SURGICAL HISTORY:  Hysterectomy.   FAMILY HISTORY:  Mother passed away at age 29.  She had coronary artery  disease and hypertension.  Father passed away at age 15 of a brain  tumor.  She has 3 siblings with diabetes mellitus.   SOCIAL HISTORY:  She is married.  She works in Engineering geologist.  She does not  smoke or drink alcohol.   CURRENT MEDICATIONS:  1. Lipitor 80 mg daily.  2. Glucophage XR 750 mg b.i.d.  3. Protonix 40 mg daily.  4. Metoprolol 25 mg b.i.d.  5. Benazepril HCl 20 mg daily.  6. Plavix 75 mg  daily.  7. Lysine supplementation.  8. Aspirin 81 mg daily.  9. Amlodipine 5 mg daily.  10.Cyclobenzaprine 5 mg daily.   ALLERGIES:  No known drug allergies.   REVIEW OF SYSTEMS:  CONSTITUTIONAL:  Significant for fever and chills.  GASTROINTESTINAL:  Please refer to the history of present illness.  CARDIOVASCULAR:  No current chest pain, but she does have the history of  myocardial infarction and coronary artery disease.  GENITOURINARY:  Negative.  PULMONARY:  Negative.  Remainder of the review of systems was  unremarkable.   PHYSICAL EXAMINATION:  VITAL SIGNS:  Temperature is 99.9, blood pressure  127/70, heart rate 85, respirations 16, and saturation is 95% on 2 L  nasal cannula.  GENERAL:  She is awake and alert.  HEENT:  Pupils are equal.  Sclerae are clear.  Face is symmetric.  NECK:  Supple with no tenderness.  Neck exam reveals no supraclavicular,  cervical, bilateral axillary, periumbilical, or inguinal  lymphadenopathy.  PULMONARY:  Lungs are clear with no wheezing.  Respiratory excursion is  good.  CARDIOVASCULAR:  Heart is regular.  No murmurs heard.  Impulse is  palpable on the left chest.  Distal pulses are 1-2 plus without  significant peripheral edema.  ABDOMEN:  Soft.  She has a fullness with a vague mass with tenderness in  the left midabdomen.  Bowel sounds are present.  There are no peritoneal  signs.  There is no generalized tenderness.  EXTREMITIES:  Warm without deformity noted.   LABORATORY STUDIES:  Lipase of 14 and troponin of 0.03.   IMPRESSION AND PLAN:  Small bowel mesenteric mass causing partial small  bowel obstruction:  In light of the patient's constitutional symptoms,  lymphoma is a possibility.  I would recommend the following:  1. Cardiology evaluation for clearance for possible surgery and the      need for holding Plavix and aspirin.  This patient had a myocardial      infarction earlier this year, treated at [facility]  2. Hold  aspirin and Plavix if okay with Cardiology as described above.  3. We will follow her for further plans in light of the above      evaluation regarding possible small bowel resection versus the      biopsy of this mesenteric mass.   Diagnosis and plan was discussed in detail with the patient, her  husband, and her daughter at length.  Questions were answered.      Terri [name], M.D.  Electronically Signed     BET/MEDQ  D:  08/16/2008  T:  08/17/2008  Job:  [id number]   cc:   Dr. Kizzie Bane

## 2011-02-13 NOTE — Op Note (Signed)
Terri [name], Terri [name]               ACCOUNT NO.:  [id number]   MEDICAL RECORD NO.:  [id number]          PATIENT TYPE:  AMB   LOCATION:  ENDO                         FACILITY:  [facility]   PHYSICIAN:  Georgiana Spinner, M.D.    DATE OF BIRTH:  Apr 18, 1951   DATE OF PROCEDURE:  11/29/2006  DATE OF DISCHARGE:                               OPERATIVE REPORT   PROCEDURE:  Upper endoscopy.   INDICATIONS:  GERD, hemoccult positivity.   ANESTHESIA:  Demerol 50, Versed 5 mg.   PROCEDURE:  With patient mildly sedated in the left lateral decubitus  position, the Pentax videoscopic endoscope was inserted in the mouth,  passed under direct vision through the esophagus, which appeared normal  until it reached the distal esophagus.  There appeared to be a small  ulcer at the distal esophagus, which was photographed and biopsies were  taken.  We then entered into the stomach.  The fundus and body appeared  normal.  The antrum showed linear erythema that was photographed and  biopsied.  Duodenal bulb, second portion of duodenum appeared normal.  From this point, the endoscope was then slowly withdrawn taking  circumferential views of the duodenal mucosa until the endoscope had  been pulled back into the stomach, placed in retroflexion and viewed the  stomach from below.  The endoscope was then straightened and withdrawn,  taking circumferential views of the remaining gastric and esophageal  mucosa.  The patient's vital signs and pulse oximeter remained stable.  The patient tolerated the procedure well without apparent complications.   FINDINGS:  Esophageal ulcer with gastric antral erythema.  Await biopsy  reports.  The patient will call me for results and follow up with me as  an outpatient.  Proceed to colonoscopy as planned.           ______________________________  Georgiana Spinner, M.D.     GMO/MEDQ  D:  11/29/2006  T:  11/29/2006  Job:  [id number]   cc:   Mina Marble, M.D.

## 2011-02-13 NOTE — Op Note (Signed)
Terri [name], Terri [name] NO.:  [id number]   MEDICAL RECORD NO.:  [id number]          PATIENT TYPE:  AMB   LOCATION:  ENDO                         FACILITY:  [facility]   PHYSICIAN:  Georgiana Spinner, M.D.    DATE OF BIRTH:  [birth year]   DATE OF PROCEDURE:  11/29/2006  DATE OF DISCHARGE:                               OPERATIVE REPORT   SURGEON:  Georgiana Spinner, M.D.   PROCEDURE:  Colonoscopy.   INDICATIONS:  Rectal bleeding, colon cancer screening.   ANESTHESIA:  Demerol 70 mg, Versed 2.5 mg.   DESCRIPTION OF PROCEDURE:  With the patient mildly sedated in the left  lateral decubitus position, a rectal examination was performed.  Subsequently, the Pentax videoscopic colonoscope was inserted into the  rectum and passed under direct vision to the cecum, identified by the  ileocecal valve and appendiceal orifice, both of which were  photographed.  From this point, the colonoscope was slowly withdrawn,  taking circumferential views of colonic mucosa, stopping only in the  rectum, which appeared normal on direct, and showed hemorrhoids on  retroflex view.  The endoscope was then straightened and withdrawn.  The  patient's vital signs and pulse oximetry remained stable.  The patient  tolerated the procedure well and without apparent complications.   FINDINGS:  Internal hemorrhoids, otherwise, an unremarkable examination.   PLAN:  See endoscopy note for further details.           ______________________________  Georgiana Spinner, M.D.     GMO/MEDQ  D:  11/29/2006  T:  11/29/2006  Job:  [id number]   cc:   Mina Marble, M.D.

## 2011-02-20 ENCOUNTER — Telehealth: Payer: Self-pay | Admitting: Internal Medicine

## 2011-02-20 DIAGNOSIS — E785 Hyperlipidemia, unspecified: Secondary | ICD-10-CM

## 2011-02-20 DIAGNOSIS — K219 Gastro-esophageal reflux disease without esophagitis: Secondary | ICD-10-CM

## 2011-02-20 DIAGNOSIS — E119 Type 2 diabetes mellitus without complications: Secondary | ICD-10-CM

## 2011-02-20 DIAGNOSIS — I10 Essential (primary) hypertension: Secondary | ICD-10-CM

## 2011-02-20 MED ORDER — BENAZEPRIL HCL 20 MG PO TABS: 20.0000 mg | ORAL_TABLET | Freq: Every day | ORAL | Status: DC

## 2011-02-20 MED ORDER — AMLODIPINE BESYLATE 5 MG PO TABS: 5.0000 mg | ORAL_TABLET | Freq: Every day | ORAL | Status: DC

## 2011-02-20 MED ORDER — PANTOPRAZOLE SODIUM 40 MG PO TBEC: 40.0000 mg | DELAYED_RELEASE_TABLET | Freq: Every day | ORAL | Status: DC

## 2011-02-20 MED ORDER — METFORMIN HCL ER 750 MG PO TB24: 750.0000 mg | ORAL_TABLET | Freq: Two times a day (BID) | ORAL | Status: DC

## 2011-02-20 MED ORDER — GLUCOSE BLOOD VI STRP: ORAL_STRIP | Status: DC

## 2011-02-20 MED ORDER — ATORVASTATIN CALCIUM 80 MG PO TABS: 80.0000 mg | ORAL_TABLET | Freq: Every day | ORAL | Status: DC

## 2011-02-20 NOTE — Telephone Encounter (Signed)
Rx refills sent to [facility] per patient request

## 2011-02-20 NOTE — Telephone Encounter (Signed)
Pt states that she was given instructions to call us when she was almost out of meds. She states she has about two weeks left. Pt would like refill sent to [facility] on all her meds. Pt states that if we need to call her, that it is okay to speak to her husband james.

## 2011-04-06 NOTE — Assessment & Plan Note (Signed)
BP at goal.  Continue current medication regimen.  BP: 110/80 mmHg  Lab Results  Component Value Date   CREATININE 0.7 01/20/2011

## 2011-04-06 NOTE — Assessment & Plan Note (Signed)
Monitor A1c Continue metformin and risk factor mgt

## 2011-04-06 NOTE — Assessment & Plan Note (Addendum)
Pt with chronic headaches.  Refer to Dr. Vela Prose for follow up.  Question tension migraine.

## 2011-06-30 LAB — GLUCOSE, CAPILLARY
Glucose-Capillary: 102 mg/dL — ABNORMAL HIGH (ref 70–99)
Glucose-Capillary: 108 mg/dL — ABNORMAL HIGH (ref 70–99)
Glucose-Capillary: 114 mg/dL — ABNORMAL HIGH (ref 70–99)
Glucose-Capillary: 115 mg/dL — ABNORMAL HIGH (ref 70–99)
Glucose-Capillary: 115 mg/dL — ABNORMAL HIGH (ref 70–99)
Glucose-Capillary: 118 mg/dL — ABNORMAL HIGH (ref 70–99)
Glucose-Capillary: 120 mg/dL — ABNORMAL HIGH (ref 70–99)
Glucose-Capillary: 121 mg/dL — ABNORMAL HIGH (ref 70–99)
Glucose-Capillary: 122 mg/dL — ABNORMAL HIGH (ref 70–99)
Glucose-Capillary: 124 mg/dL — ABNORMAL HIGH (ref 70–99)
Glucose-Capillary: 124 mg/dL — ABNORMAL HIGH (ref 70–99)
Glucose-Capillary: 127 mg/dL — ABNORMAL HIGH (ref 70–99)
Glucose-Capillary: 129 mg/dL — ABNORMAL HIGH (ref 70–99)
Glucose-Capillary: 131 mg/dL — ABNORMAL HIGH (ref 70–99)
Glucose-Capillary: 132 mg/dL — ABNORMAL HIGH (ref 70–99)
Glucose-Capillary: 139 mg/dL — ABNORMAL HIGH (ref 70–99)
Glucose-Capillary: 140 mg/dL — ABNORMAL HIGH (ref 70–99)
Glucose-Capillary: 140 mg/dL — ABNORMAL HIGH (ref 70–99)
Glucose-Capillary: 143 mg/dL — ABNORMAL HIGH (ref 70–99)
Glucose-Capillary: 144 mg/dL — ABNORMAL HIGH (ref 70–99)
Glucose-Capillary: 149 mg/dL — ABNORMAL HIGH (ref 70–99)
Glucose-Capillary: 151 mg/dL — ABNORMAL HIGH (ref 70–99)
Glucose-Capillary: 155 mg/dL — ABNORMAL HIGH (ref 70–99)
Glucose-Capillary: 168 mg/dL — ABNORMAL HIGH (ref 70–99)
Glucose-Capillary: 184 mg/dL — ABNORMAL HIGH (ref 70–99)
Glucose-Capillary: 85 mg/dL (ref 70–99)
Glucose-Capillary: 87 mg/dL (ref 70–99)
Glucose-Capillary: 97 mg/dL (ref 70–99)

## 2011-06-30 LAB — CBC
HCT: 29.2 % — ABNORMAL LOW (ref 36.0–46.0)
HCT: 36.9 % (ref 36.0–46.0)
HCT: 39.8 % (ref 36.0–46.0)
Hemoglobin: 12.1 g/dL (ref 12.0–15.0)
MCHC: 32.8 g/dL (ref 30.0–36.0)
MCHC: 33.4 g/dL (ref 30.0–36.0)
MCV: 90 fL (ref 78.0–100.0)
MCV: 90.3 fL (ref 78.0–100.0)
Platelets: 479 10*3/uL — ABNORMAL HIGH (ref 150–400)
Platelets: 545 10*3/uL — ABNORMAL HIGH (ref 150–400)
Platelets: 640 10*3/uL — ABNORMAL HIGH (ref 150–400)
RBC: 3.6 MIL/uL — ABNORMAL LOW (ref 3.87–5.11)
RBC: 4.42 MIL/uL (ref 3.87–5.11)
RDW: 13.8 % (ref 11.5–15.5)
WBC: 11.1 10*3/uL — ABNORMAL HIGH (ref 4.0–10.5)
WBC: 7.9 10*3/uL (ref 4.0–10.5)
WBC: 9.2 10*3/uL (ref 4.0–10.5)

## 2011-06-30 LAB — BASIC METABOLIC PANEL
BUN: 3 mg/dL — ABNORMAL LOW (ref 6–23)
BUN: 5 mg/dL — ABNORMAL LOW (ref 6–23)
BUN: 6 mg/dL (ref 6–23)
CO2: 29 mEq/L (ref 19–32)
CO2: 30 mEq/L (ref 19–32)
CO2: 31 mEq/L (ref 19–32)
Calcium: 8.1 mg/dL — ABNORMAL LOW (ref 8.4–10.5)
Calcium: 8.4 mg/dL (ref 8.4–10.5)
Chloride: 103 mEq/L (ref 96–112)
Chloride: 105 mEq/L (ref 96–112)
Creatinine, Ser: 0.8 mg/dL (ref 0.4–1.2)
Creatinine, Ser: 1 mg/dL (ref 0.4–1.2)
GFR calc Af Amer: >60 mL/min (ref >60)
GFR calc Af Amer: >60 mL/min (ref >60)
GFR calc non Af Amer: 40 mL/min — ABNORMAL LOW (ref >60)
GFR calc non Af Amer: 57 mL/min — ABNORMAL LOW (ref >60)
Glucose, Bld: 144 mg/dL — ABNORMAL HIGH (ref 70–99)
Glucose, Bld: 156 mg/dL — ABNORMAL HIGH (ref 70–99)
Glucose, Bld: 157 mg/dL — ABNORMAL HIGH (ref 70–99)
Potassium: 3.4 mEq/L — ABNORMAL LOW (ref 3.5–5.1)
Sodium: 139 mEq/L (ref 135–145)
Sodium: 141 mEq/L (ref 135–145)

## 2011-06-30 LAB — COMPREHENSIVE METABOLIC PANEL
AST: 29 U/L (ref 0–37)
Alkaline Phosphatase: 110 U/L (ref 39–117)
BUN: 7 mg/dL (ref 6–23)
CO2: 26 mEq/L (ref 19–32)
Chloride: 108 mEq/L (ref 96–112)
Creatinine, Ser: 0.63 mg/dL (ref 0.4–1.2)
GFR calc non Af Amer: >60 mL/min (ref >60)
Total Bilirubin: 1.3 mg/dL — ABNORMAL HIGH (ref 0.3–1.2)

## 2011-06-30 LAB — CULTURE, BLOOD (ROUTINE X 2): Culture: NO GROWTH

## 2011-06-30 LAB — CARDIAC PANEL(CRET KIN+CKTOT+MB+TROPI)
CK, MB: 0.6 ng/mL (ref 0.3–4.0)
CK, MB: 0.7 ng/mL (ref 0.3–4.0)
Relative Index: INVALID (ref 0.0–2.5)
Total CK: 39 U/L (ref 7–177)
Total CK: 39 U/L (ref 7–177)
Troponin I: 0.01 ng/mL (ref 0.00–0.06)
Troponin I: 0.03 ng/mL (ref 0.00–0.06)

## 2011-06-30 LAB — TYPE AND SCREEN: ABO/RH(D): O POS

## 2011-07-01 LAB — DIFFERENTIAL
Basophils Absolute: 0
Lymphocytes Relative: 7 — ABNORMAL LOW
Monocytes Absolute: 0.7
Neutro Abs: 7.9 — ABNORMAL HIGH

## 2011-07-01 LAB — HEPATIC FUNCTION PANEL
ALT: 85 — ABNORMAL HIGH
Alkaline Phosphatase: 143 — ABNORMAL HIGH
Indirect Bilirubin: 1.2 — ABNORMAL HIGH
Total Bilirubin: 1.2

## 2011-07-01 LAB — CULTURE, BLOOD (ROUTINE X 2): Culture: NO GROWTH

## 2011-07-01 LAB — BASIC METABOLIC PANEL
Calcium: 9.5
GFR calc Af Amer: >60
GFR calc non Af Amer: >60
Glucose, Bld: 162 — ABNORMAL HIGH
Sodium: 139

## 2011-07-01 LAB — CBC
Hemoglobin: 14.3
RBC: 4.86
RDW: 12.7

## 2011-07-01 LAB — LIPASE, BLOOD: Lipase: 49

## 2011-07-01 LAB — POCT CARDIAC MARKERS
CKMB, poc: <1 — ABNORMAL LOW
Troponin i, poc: <0.05

## 2011-07-01 LAB — APTT: aPTT: 31

## 2011-07-03 LAB — BASIC METABOLIC PANEL
BUN: 1 mg/dL — ABNORMAL LOW (ref 6–23)
BUN: 24 mg/dL — ABNORMAL HIGH (ref 6–23)
BUN: 26 mg/dL — ABNORMAL HIGH (ref 6–23)
BUN: <1 mg/dL — ABNORMAL LOW (ref 6–23)
CO2: 26 mEq/L (ref 19–32)
CO2: 29 mEq/L (ref 19–32)
CO2: 30 mEq/L (ref 19–32)
Calcium: 9.1 mg/dL (ref 8.4–10.5)
Calcium: 9.1 mg/dL (ref 8.4–10.5)
Calcium: 9.2 mg/dL (ref 8.4–10.5)
Chloride: 101 mEq/L (ref 96–112)
Chloride: 102 mEq/L (ref 96–112)
Chloride: 103 mEq/L (ref 96–112)
Creatinine, Ser: 0.84 mg/dL (ref 0.4–1.2)
Creatinine, Ser: 0.85 mg/dL (ref 0.4–1.2)
Creatinine, Ser: 0.85 mg/dL (ref 0.4–1.2)
GFR calc Af Amer: >60 mL/min (ref >60)
GFR calc Af Amer: >60 mL/min (ref >60)
GFR calc Af Amer: >60 mL/min (ref >60)
GFR calc non Af Amer: 60 mL/min — ABNORMAL LOW (ref >60)
GFR calc non Af Amer: >60 mL/min (ref >60)
GFR calc non Af Amer: >60 mL/min (ref >60)
GFR calc non Af Amer: >60 mL/min (ref >60)
Glucose, Bld: 101 mg/dL — ABNORMAL HIGH (ref 70–99)
Glucose, Bld: 105 mg/dL — ABNORMAL HIGH (ref 70–99)
Glucose, Bld: 114 mg/dL — ABNORMAL HIGH (ref 70–99)
Glucose, Bld: 133 mg/dL — ABNORMAL HIGH (ref 70–99)
Potassium: 3.2 mEq/L — ABNORMAL LOW (ref 3.5–5.1)
Potassium: 4.3 mEq/L (ref 3.5–5.1)
Potassium: 4.8 mEq/L (ref 3.5–5.1)
Sodium: 131 mEq/L — ABNORMAL LOW (ref 135–145)
Sodium: 133 mEq/L — ABNORMAL LOW (ref 135–145)
Sodium: 135 mEq/L (ref 135–145)
Sodium: 139 mEq/L (ref 135–145)

## 2011-07-03 LAB — GLUCOSE, CAPILLARY
Glucose-Capillary: 102 mg/dL — ABNORMAL HIGH (ref 70–99)
Glucose-Capillary: 106 mg/dL — ABNORMAL HIGH (ref 70–99)
Glucose-Capillary: 106 mg/dL — ABNORMAL HIGH (ref 70–99)
Glucose-Capillary: 108 mg/dL — ABNORMAL HIGH (ref 70–99)
Glucose-Capillary: 108 mg/dL — ABNORMAL HIGH (ref 70–99)
Glucose-Capillary: 109 mg/dL — ABNORMAL HIGH (ref 70–99)
Glucose-Capillary: 114 mg/dL — ABNORMAL HIGH (ref 70–99)
Glucose-Capillary: 118 mg/dL — ABNORMAL HIGH (ref 70–99)
Glucose-Capillary: 122 mg/dL — ABNORMAL HIGH (ref 70–99)
Glucose-Capillary: 130 mg/dL — ABNORMAL HIGH (ref 70–99)
Glucose-Capillary: 131 mg/dL — ABNORMAL HIGH (ref 70–99)
Glucose-Capillary: 134 mg/dL — ABNORMAL HIGH (ref 70–99)
Glucose-Capillary: 134 mg/dL — ABNORMAL HIGH (ref 70–99)
Glucose-Capillary: 134 mg/dL — ABNORMAL HIGH (ref 70–99)
Glucose-Capillary: 136 mg/dL — ABNORMAL HIGH (ref 70–99)
Glucose-Capillary: 136 mg/dL — ABNORMAL HIGH (ref 70–99)
Glucose-Capillary: 137 mg/dL — ABNORMAL HIGH (ref 70–99)
Glucose-Capillary: 137 mg/dL — ABNORMAL HIGH (ref 70–99)
Glucose-Capillary: 137 mg/dL — ABNORMAL HIGH (ref 70–99)
Glucose-Capillary: 139 mg/dL — ABNORMAL HIGH (ref 70–99)
Glucose-Capillary: 139 mg/dL — ABNORMAL HIGH (ref 70–99)
Glucose-Capillary: 141 mg/dL — ABNORMAL HIGH (ref 70–99)
Glucose-Capillary: 142 mg/dL — ABNORMAL HIGH (ref 70–99)
Glucose-Capillary: 142 mg/dL — ABNORMAL HIGH (ref 70–99)
Glucose-Capillary: 142 mg/dL — ABNORMAL HIGH (ref 70–99)
Glucose-Capillary: 143 mg/dL — ABNORMAL HIGH (ref 70–99)
Glucose-Capillary: 144 mg/dL — ABNORMAL HIGH (ref 70–99)
Glucose-Capillary: 144 mg/dL — ABNORMAL HIGH (ref 70–99)
Glucose-Capillary: 145 mg/dL — ABNORMAL HIGH (ref 70–99)
Glucose-Capillary: 145 mg/dL — ABNORMAL HIGH (ref 70–99)
Glucose-Capillary: 146 mg/dL — ABNORMAL HIGH (ref 70–99)
Glucose-Capillary: 148 mg/dL — ABNORMAL HIGH (ref 70–99)
Glucose-Capillary: 149 mg/dL — ABNORMAL HIGH (ref 70–99)
Glucose-Capillary: 149 mg/dL — ABNORMAL HIGH (ref 70–99)
Glucose-Capillary: 149 mg/dL — ABNORMAL HIGH (ref 70–99)
Glucose-Capillary: 150 mg/dL — ABNORMAL HIGH (ref 70–99)
Glucose-Capillary: 152 mg/dL — ABNORMAL HIGH (ref 70–99)
Glucose-Capillary: 154 mg/dL — ABNORMAL HIGH (ref 70–99)
Glucose-Capillary: 154 mg/dL — ABNORMAL HIGH (ref 70–99)
Glucose-Capillary: 157 mg/dL — ABNORMAL HIGH (ref 70–99)
Glucose-Capillary: 158 mg/dL — ABNORMAL HIGH (ref 70–99)
Glucose-Capillary: 159 mg/dL — ABNORMAL HIGH (ref 70–99)
Glucose-Capillary: 162 mg/dL — ABNORMAL HIGH (ref 70–99)
Glucose-Capillary: 168 mg/dL — ABNORMAL HIGH (ref 70–99)
Glucose-Capillary: 169 mg/dL — ABNORMAL HIGH (ref 70–99)
Glucose-Capillary: 170 mg/dL — ABNORMAL HIGH (ref 70–99)
Glucose-Capillary: 183 mg/dL — ABNORMAL HIGH (ref 70–99)
Glucose-Capillary: 195 mg/dL — ABNORMAL HIGH (ref 70–99)
Glucose-Capillary: 197 mg/dL — ABNORMAL HIGH (ref 70–99)
Glucose-Capillary: 202 mg/dL — ABNORMAL HIGH (ref 70–99)
Glucose-Capillary: 90 mg/dL (ref 70–99)
Glucose-Capillary: 92 mg/dL (ref 70–99)
Glucose-Capillary: 95 mg/dL (ref 70–99)
Glucose-Capillary: 97 mg/dL (ref 70–99)

## 2011-07-03 LAB — CBC
HCT: 28.3 % — ABNORMAL LOW (ref 36.0–46.0)
HCT: 36.6 % (ref 36.0–46.0)
HCT: 41.5 % (ref 36.0–46.0)
Hemoglobin: 10.6 g/dL — ABNORMAL LOW (ref 12.0–15.0)
Hemoglobin: 11.9 g/dL — ABNORMAL LOW (ref 12.0–15.0)
Hemoglobin: 12.2 g/dL (ref 12.0–15.0)
Hemoglobin: 12.2 g/dL (ref 12.0–15.0)
Hemoglobin: 12.6 g/dL (ref 12.0–15.0)
Hemoglobin: 13.4 g/dL (ref 12.0–15.0)
MCHC: 32.8 g/dL (ref 30.0–36.0)
MCHC: 33 g/dL (ref 30.0–36.0)
MCHC: 33.2 g/dL (ref 30.0–36.0)
MCHC: 33.4 g/dL (ref 30.0–36.0)
MCHC: 33.8 g/dL (ref 30.0–36.0)
MCHC: 34.6 g/dL (ref 30.0–36.0)
MCV: 87.7 fL (ref 78.0–100.0)
MCV: 88.3 fL (ref 78.0–100.0)
MCV: 88.5 fL (ref 78.0–100.0)
Platelets: 423 10*3/uL — ABNORMAL HIGH (ref 150–400)
Platelets: 489 10*3/uL — ABNORMAL HIGH (ref 150–400)
Platelets: 549 10*3/uL — ABNORMAL HIGH (ref 150–400)
Platelets: 636 10*3/uL — ABNORMAL HIGH (ref 150–400)
Platelets: 637 10*3/uL — ABNORMAL HIGH (ref 150–400)
RBC: 3.66 MIL/uL — ABNORMAL LOW (ref 3.87–5.11)
RBC: 4.19 MIL/uL (ref 3.87–5.11)
RBC: 4.21 MIL/uL (ref 3.87–5.11)
RBC: 4.39 MIL/uL (ref 3.87–5.11)
RBC: 4.69 MIL/uL (ref 3.87–5.11)
RDW: 13.1 % (ref 11.5–15.5)
RDW: 13.1 % (ref 11.5–15.5)
RDW: 13.1 % (ref 11.5–15.5)
RDW: 13.4 % (ref 11.5–15.5)
WBC: 10.6 10*3/uL — ABNORMAL HIGH (ref 4.0–10.5)
WBC: 9 10*3/uL (ref 4.0–10.5)
WBC: 9.6 10*3/uL (ref 4.0–10.5)
WBC: 9.6 10*3/uL (ref 4.0–10.5)

## 2011-07-03 LAB — DIFFERENTIAL
Eosinophils Absolute: 0.1 10*3/uL (ref 0.0–0.7)
Eosinophils Absolute: 0.2 10*3/uL (ref 0.0–0.7)
Eosinophils Relative: 1 % (ref 0–5)
Eosinophils Relative: 3 % (ref 0–5)
Lymphocytes Relative: 20 % (ref 12–46)
Lymphs Abs: 1.3 10*3/uL (ref 0.7–4.0)
Lymphs Abs: 1.7 10*3/uL (ref 0.7–4.0)
Lymphs Abs: 1.9 10*3/uL (ref 0.7–4.0)
Lymphs Abs: 1.9 10*3/uL (ref 0.7–4.0)
Monocytes Absolute: 1 10*3/uL (ref 0.1–1.0)
Monocytes Relative: 10 % (ref 3–12)
Monocytes Relative: 11 % (ref 3–12)
Neutro Abs: 5.4 10*3/uL (ref 1.7–7.7)
Neutro Abs: 5.9 10*3/uL (ref 1.7–7.7)
Neutrophils Relative %: 64 % (ref 43–77)
Neutrophils Relative %: 65 % (ref 43–77)

## 2011-07-03 LAB — LIPID PANEL
Cholesterol: 107 mg/dL (ref 0–200)
LDL Cholesterol: 63 mg/dL (ref 0–99)
Triglycerides: 76 mg/dL (ref <150)

## 2011-07-03 LAB — PHOSPHORUS
Phosphorus: 3.7 mg/dL (ref 2.3–4.6)
Phosphorus: 4.5 mg/dL (ref 2.3–4.6)

## 2011-07-03 LAB — COMPREHENSIVE METABOLIC PANEL
ALT: 217 U/L — ABNORMAL HIGH (ref 0–35)
ALT: 24 U/L (ref 0–35)
ALT: 25 U/L (ref 0–35)
AST: 113 U/L — ABNORMAL HIGH (ref 0–37)
AST: 19 U/L (ref 0–37)
AST: 23 U/L (ref 0–37)
AST: 74 U/L — ABNORMAL HIGH (ref 0–37)
Albumin: 2.7 g/dL — ABNORMAL LOW (ref 3.5–5.2)
Albumin: 2.9 g/dL — ABNORMAL LOW (ref 3.5–5.2)
Alkaline Phosphatase: 178 U/L — ABNORMAL HIGH (ref 39–117)
Alkaline Phosphatase: 195 U/L — ABNORMAL HIGH (ref 39–117)
Alkaline Phosphatase: 350 U/L — ABNORMAL HIGH (ref 39–117)
BUN: 23 mg/dL (ref 6–23)
BUN: 27 mg/dL — ABNORMAL HIGH (ref 6–23)
CO2: 21 mEq/L (ref 19–32)
CO2: 21 mEq/L (ref 19–32)
CO2: 27 mEq/L (ref 19–32)
CO2: 30 mEq/L (ref 19–32)
Calcium: 8.9 mg/dL (ref 8.4–10.5)
Calcium: 9 mg/dL (ref 8.4–10.5)
Calcium: 9.1 mg/dL (ref 8.4–10.5)
Calcium: 9.2 mg/dL (ref 8.4–10.5)
Calcium: 9.3 mg/dL (ref 8.4–10.5)
Calcium: 9.4 mg/dL (ref 8.4–10.5)
Chloride: 104 mEq/L (ref 96–112)
Creatinine, Ser: 0.88 mg/dL (ref 0.4–1.2)
Creatinine, Ser: 0.9 mg/dL (ref 0.4–1.2)
GFR calc Af Amer: >60 mL/min (ref >60)
GFR calc Af Amer: >60 mL/min (ref >60)
GFR calc Af Amer: >60 mL/min (ref >60)
GFR calc Af Amer: >60 mL/min (ref >60)
GFR calc Af Amer: >60 mL/min (ref >60)
GFR calc non Af Amer: >60 mL/min (ref >60)
GFR calc non Af Amer: >60 mL/min (ref >60)
GFR calc non Af Amer: >60 mL/min (ref >60)
Glucose, Bld: 112 mg/dL — ABNORMAL HIGH (ref 70–99)
Glucose, Bld: 134 mg/dL — ABNORMAL HIGH (ref 70–99)
Glucose, Bld: 167 mg/dL — ABNORMAL HIGH (ref 70–99)
Potassium: 3.4 mEq/L — ABNORMAL LOW (ref 3.5–5.1)
Potassium: 3.5 mEq/L (ref 3.5–5.1)
Potassium: 4.5 mEq/L (ref 3.5–5.1)
Potassium: 4.7 mEq/L (ref 3.5–5.1)
Sodium: 132 mEq/L — ABNORMAL LOW (ref 135–145)
Sodium: 136 mEq/L (ref 135–145)
Sodium: 140 mEq/L (ref 135–145)
Sodium: 142 mEq/L (ref 135–145)
Total Bilirubin: 0.8 mg/dL (ref 0.3–1.2)
Total Bilirubin: 0.9 mg/dL (ref 0.3–1.2)
Total Protein: 5.9 g/dL — ABNORMAL LOW (ref 6.0–8.3)
Total Protein: 6.2 g/dL (ref 6.0–8.3)
Total Protein: 6.7 g/dL (ref 6.0–8.3)

## 2011-07-03 LAB — TRIGLYCERIDES
Triglycerides: 137 mg/dL (ref <150)
Triglycerides: 155 mg/dL — ABNORMAL HIGH (ref <150)
Triglycerides: 183 mg/dL — ABNORMAL HIGH (ref <150)
Triglycerides: 90 mg/dL (ref <150)

## 2011-07-03 LAB — CHOLESTEROL, TOTAL
Cholesterol: 101 mg/dL (ref 0–200)
Cholesterol: 107 mg/dL (ref 0–200)
Cholesterol: 112 mg/dL (ref 0–200)

## 2011-07-03 LAB — MAGNESIUM
Magnesium: 2.3 mg/dL (ref 1.5–2.5)
Magnesium: 2.4 mg/dL (ref 1.5–2.5)

## 2011-07-03 LAB — TYPE AND SCREEN: Antibody Screen: NEGATIVE

## 2011-07-03 LAB — PREALBUMIN
Prealbumin: 15.9 mg/dL — ABNORMAL LOW (ref 18.0–45.0)
Prealbumin: 25.9 mg/dL (ref 18.0–45.0)

## 2011-08-03 ENCOUNTER — Ambulatory Visit: Payer: BC Managed Care – PPO | Admitting: Internal Medicine

## 2011-09-02 ENCOUNTER — Ambulatory Visit (INDEPENDENT_AMBULATORY_CARE_PROVIDER_SITE_OTHER): Payer: BC Managed Care – PPO | Admitting: Internal Medicine

## 2011-09-02 DIAGNOSIS — K219 Gastro-esophageal reflux disease without esophagitis: Secondary | ICD-10-CM

## 2011-09-02 DIAGNOSIS — I10 Essential (primary) hypertension: Secondary | ICD-10-CM

## 2011-09-02 DIAGNOSIS — E119 Type 2 diabetes mellitus without complications: Secondary | ICD-10-CM

## 2011-09-02 DIAGNOSIS — E785 Hyperlipidemia, unspecified: Secondary | ICD-10-CM

## 2011-09-02 DIAGNOSIS — T753XXA Motion sickness, initial encounter: Secondary | ICD-10-CM

## 2011-09-02 DIAGNOSIS — J069 Acute upper respiratory infection, unspecified: Secondary | ICD-10-CM

## 2011-09-02 MED ORDER — ONDANSETRON HCL 4 MG PO TABS: 4.0000 mg | ORAL_TABLET | Freq: Two times a day (BID) | ORAL | Status: DC | PRN

## 2011-09-02 MED ORDER — PANTOPRAZOLE SODIUM 40 MG PO TBEC: 40.0000 mg | DELAYED_RELEASE_TABLET | Freq: Every day | ORAL | Status: DC

## 2011-09-02 MED ORDER — BENAZEPRIL HCL 20 MG PO TABS: 20.0000 mg | ORAL_TABLET | Freq: Every day | ORAL | Status: DC

## 2011-09-02 MED ORDER — SCOPOLAMINE 1 MG/3DAYS TD PT72: 1.0000 | MEDICATED_PATCH | TRANSDERMAL | Status: DC

## 2011-09-02 MED ORDER — ATORVASTATIN CALCIUM 80 MG PO TABS: 80.0000 mg | ORAL_TABLET | Freq: Every day | ORAL | Status: DC

## 2011-09-02 MED ORDER — AMLODIPINE BESYLATE 5 MG PO TABS: 5.0000 mg | ORAL_TABLET | Freq: Every day | ORAL | Status: DC

## 2011-09-02 MED ORDER — METFORMIN HCL ER 750 MG PO TB24: 750.0000 mg | ORAL_TABLET | Freq: Two times a day (BID) | ORAL | Status: DC

## 2011-09-02 NOTE — Assessment & Plan Note (Signed)
Patient's symptoms improving. I suspect viral URI.  Continue symptomatic treatment.  Patient advised to call office if symptoms persist or worsen.

## 2011-09-02 NOTE — Progress Notes (Signed)
Subjective:    Patient ID: Terri [name], female    DOB: 1951-02-05, 60 y.o.   MRN: [id number]  URI  This is a new problem. The current episode started in the past 7 days. The problem has been gradually improving. There has been no fever. Associated symptoms include congestion, coughing and sinus pain. Pertinent negatives include no chest pain or ear pain. She has tried decongestant for the symptoms. The treatment provided moderate relief.  Diabetes She presents for her follow-up diabetic visit. She has type 2 diabetes mellitus. Her disease course has been stable. Pertinent negatives for diabetes include no blurred vision, no chest pain, no foot ulcerations and no visual change. There are no hypoglycemic complications. Symptoms are stable. Risk factors for coronary artery disease include diabetes mellitus and hypertension. Current diabetic treatment includes oral agent (monotherapy). She is compliant with treatment most of the time. Her weight is stable. She is following a generally healthy diet. An ACE inhibitor/angiotensin II receptor blocker is being taken.   Patient requests rx for nausea. She is planning to go on a cruise for 5 days.  Htn - stable   Review of Systems  HENT: Positive for congestion. Negative for ear pain.   Eyes: Negative for blurred vision.  Respiratory: Positive for cough.   Cardiovascular: Negative for chest pain.   Past Medical History  Diagnosis Date  . Abnormal coagulation profile   . Spontaneous ecchymoses   . Fever, unspecified   . Routine general medical examination at a health care facility   . Other postoperative infection   . Abdominal mass   . Abdominal pain   . Headache   . Hypertension   . Hyperlipidemia   . GERD (gastroesophageal reflux disease)   . CAD (coronary artery disease)   . Diabetes mellitus type II, uncontrolled   . Mesenteric mass 07/2008    complicated by nausea & vomiting  . S/P PICC central line placement 10/2008    line  sepsis-right PICC line removed     History   Social History  . Marital Status: Married    Spouse Name: N/A    Number of Children: N/A  . Years of Education: N/A   Occupational History  . Not on file.   Social History Main Topics  . Smoking status: Never Smoker   . Smokeless tobacco: Not on file  . Alcohol Use: Not on file  . Drug Use: Not on file  . Sexually Active: Not on file   Other Topics Concern  . 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 Total abdominal hysterectomy 1993  . Abdominal mass resection 07/2008    Mesentertic Mass resection    Family History  Problem Relation Age of Onset  . Coronary artery disease Mother   . Hypertension Mother   . Brain cancer Father 81    deceased  . Diabetes Sister   . Diabetes Brother      X 2    No Known Allergies  Current Outpatient Prescriptions on File Prior to Visit  Medication Sig Dispense Refill  . aspirin 325 MG tablet Take 325 mg by mouth daily.        Marland Kitchen glucose blood (ONE TOUCH TEST STRIPS) test strip Use as instructed to check  blood sugar three times daily  100 each  1  . Lysine HCl 500 MG TABS Take 500 mg by mouth daily.          BP 132/88  Pulse 68  Temp(Src) 97.8 F (36.6 C) (Oral)  Ht 5\' 7"  (1.702 m)  Wt 216 lb (97.977 kg)  BMI 33.83 kg/m2       Objective:   Physical Exam  Constitutional: She is oriented to person, place, and time. She appears well-developed and well-nourished.  HENT:  Head: Normocephalic and atraumatic.  Right Ear: External ear normal.  Left Ear: External ear normal.  Mouth/Throat: Oropharynx is clear and moist.  Eyes: Conjunctivae are normal. Pupils are equal, round, and reactive to light.  Neck: Neck supple.  Cardiovascular: Normal rate, regular rhythm and normal heart sounds.   No murmur heard. Pulmonary/Chest: Effort  normal and breath sounds normal. No respiratory distress. She has no wheezes. She has no rales.  Lymphadenopathy:    She has no cervical adenopathy.  Neurological: She is alert and oriented to person, place, and time.  Skin: Skin is warm and dry.  Psychiatric: She has a normal mood and affect. Her behavior is normal.          Assessment & Plan:

## 2011-09-02 NOTE — Assessment & Plan Note (Signed)
Well controlled.  Continue current medication regimen. BP: 132/88 mmHg  Lab Results  Component Value Date   CREATININE 0.7 01/20/2011

## 2011-09-02 NOTE — Patient Instructions (Addendum)
Please call our office if your symptoms do not improve or gets worse. Please complete the following lab tests before your next follow up appointment: BMET - 401.9 A1c - 250.00 Lipid panel, LFTs - 272.4

## 2011-09-02 NOTE — Assessment & Plan Note (Signed)
Stable.   Monitor A1c.  Continue metformin.  Lab Results  Component Value Date   HGBA1C 6.4* 01/27/2011

## 2011-09-02 NOTE — Assessment & Plan Note (Signed)
Patient planning to go on Cruise.  Rx for scopolamine patch provided. Common side effects reviewed.

## 2011-09-04 ENCOUNTER — Other Ambulatory Visit: Payer: Self-pay | Admitting: Internal Medicine

## 2012-01-14 ENCOUNTER — Telehealth: Payer: Self-pay | Admitting: Internal Medicine

## 2012-01-14 DIAGNOSIS — K219 Gastro-esophageal reflux disease without esophagitis: Secondary | ICD-10-CM

## 2012-01-14 NOTE — Telephone Encounter (Signed)
Pt called req to get refill for #30 pantoprazole (PROTONIX) 40 MG tablet to [facility] on [address]. Pt is completely out of meds.

## 2012-01-15 MED ORDER — PANTOPRAZOLE SODIUM 40 MG PO TBEC: 40.0000 mg | DELAYED_RELEASE_TABLET | Freq: Every day | ORAL | Status: DC

## 2012-01-15 NOTE — Telephone Encounter (Signed)
rx sent in electronically 

## 2012-02-24 ENCOUNTER — Other Ambulatory Visit (INDEPENDENT_AMBULATORY_CARE_PROVIDER_SITE_OTHER): Payer: BC Managed Care – PPO

## 2012-02-24 DIAGNOSIS — E119 Type 2 diabetes mellitus without complications: Secondary | ICD-10-CM

## 2012-02-24 DIAGNOSIS — E785 Hyperlipidemia, unspecified: Secondary | ICD-10-CM

## 2012-02-24 DIAGNOSIS — I10 Essential (primary) hypertension: Secondary | ICD-10-CM

## 2012-02-24 LAB — LIPID PANEL
Cholesterol: 126 mg/dL (ref 0–200)
LDL Cholesterol: 46 mg/dL (ref 0–99)
VLDL: 22.2 mg/dL (ref 0.0–40.0)

## 2012-02-24 LAB — BASIC METABOLIC PANEL
BUN: 10 mg/dL (ref 6–23)
CO2: 30 mEq/L (ref 19–32)
Chloride: 107 mEq/L (ref 96–112)
Creatinine, Ser: 0.7 mg/dL (ref 0.4–1.2)
Glucose, Bld: 115 mg/dL — ABNORMAL HIGH (ref 70–99)

## 2012-03-02 ENCOUNTER — Ambulatory Visit (INDEPENDENT_AMBULATORY_CARE_PROVIDER_SITE_OTHER): Payer: BC Managed Care – PPO | Admitting: Internal Medicine

## 2012-03-02 ENCOUNTER — Encounter: Payer: Self-pay | Admitting: Internal Medicine

## 2012-03-02 ENCOUNTER — Other Ambulatory Visit: Payer: Self-pay | Admitting: Internal Medicine

## 2012-03-02 VITALS — BP 124/84 | HR 72 | Temp 98.0°F | Wt 229.0 lb

## 2012-03-02 DIAGNOSIS — E785 Hyperlipidemia, unspecified: Secondary | ICD-10-CM

## 2012-03-02 DIAGNOSIS — E119 Type 2 diabetes mellitus without complications: Secondary | ICD-10-CM

## 2012-03-02 DIAGNOSIS — I10 Essential (primary) hypertension: Secondary | ICD-10-CM

## 2012-03-02 DIAGNOSIS — K219 Gastro-esophageal reflux disease without esophagitis: Secondary | ICD-10-CM

## 2012-03-02 DIAGNOSIS — R51 Headache: Secondary | ICD-10-CM

## 2012-03-02 MED ORDER — PANTOPRAZOLE SODIUM 40 MG PO TBEC: 40.0000 mg | DELAYED_RELEASE_TABLET | Freq: Every day | ORAL | Status: DC

## 2012-03-02 MED ORDER — BENAZEPRIL HCL 20 MG PO TABS: 20.0000 mg | ORAL_TABLET | Freq: Every day | ORAL | Status: DC

## 2012-03-02 MED ORDER — LYSINE HCL 500 MG PO TABS: 500.0000 mg | ORAL_TABLET | Freq: Every day | ORAL | Status: DC

## 2012-03-02 MED ORDER — CYCLOBENZAPRINE HCL 5 MG PO TABS: 5.0000 mg | ORAL_TABLET | Freq: Every day | ORAL | Status: DC

## 2012-03-02 MED ORDER — AMLODIPINE BESYLATE 5 MG PO TABS: 5.0000 mg | ORAL_TABLET | Freq: Every day | ORAL | Status: DC

## 2012-03-02 MED ORDER — CYCLOBENZAPRINE HCL 5 MG PO TABS: 5.0000 mg | ORAL_TABLET | Freq: Every day | ORAL | Status: AC

## 2012-03-02 MED ORDER — ATORVASTATIN CALCIUM 80 MG PO TABS: 80.0000 mg | ORAL_TABLET | Freq: Every day | ORAL | Status: DC

## 2012-03-02 MED ORDER — METFORMIN HCL ER 750 MG PO TB24: 750.0000 mg | ORAL_TABLET | Freq: Two times a day (BID) | ORAL | Status: DC

## 2012-03-02 NOTE — Assessment & Plan Note (Signed)
Patient never went to headache specialist for further evaluation. She may have chronic rebound analgesic headache. Patient to stop all over-the-counter painkillers. Use cyclobenzaprine 5 mg at bedtime.  If no change in headache frequency or severity, we discussed using other agents such as amitriptyline.

## 2012-03-02 NOTE — Assessment & Plan Note (Signed)
LDL at goal.  Continue current dose of lipitor Lab Results  Component Value Date   CHOL 126 02/24/2012   HDL 58.00 02/24/2012   LDLCALC 46 02/24/2012   TRIG 111.0 02/24/2012   CHOLHDL 2 02/24/2012

## 2012-03-02 NOTE — Patient Instructions (Signed)
Stop all over the counter pain medication Make sure you limit you carbohydrate intake to 25 -30 grams per meal Start daily exercise program

## 2012-03-02 NOTE — Assessment & Plan Note (Signed)
Well controlled.  No change in medication BP: 124/84 mmHg  Lab Results  Component Value Date   CREATININE 0.7 02/24/2012

## 2012-03-02 NOTE — Assessment & Plan Note (Signed)
Slight worsening of her A1c to 6.7. Continue current dose of metformin. Patient advised to limit her carbohydrate intake to 25-30 g per meal. Regular exercise also encouraged. Continue ACE inhibitor and statin.  Continue daily aspirin.

## 2012-03-02 NOTE — Progress Notes (Signed)
Subjective:    Patient ID: Terri [name], female    DOB: 1950/12/17, 61 y.o.   MRN: [id number]  HPI  61 year old [place] American female with history of type 2 diabetes and hypertension for routine followup. Overall patient has been doing very well. Her diet is fairly healthy and she is compliant with her metformin.  Unfortunately there was a  slight increase in her A1c to 6.7.  She complains of chronic headache. This has been ongoing for several years. She reports previous CT of head which was normal in the past. Her symptoms have not changed in character or severity. She has several headaches per week. Symptoms related to tense muscles in her neck. Patient denies any nausea or vomiting. She denies any photophobia. She has been taking over-the-counter analgesics as needed for years.  OTC sinus medications also seem to help.  Review of Systems Negative for chest pain or shortness of breath  Past Medical History  Diagnosis Date  . Abnormal coagulation profile   . Spontaneous ecchymoses   . Fever, unspecified   . Routine general medical examination at a health care facility   . Other postoperative infection   . Abdominal mass   . Abdominal pain   . Headache   . Hypertension   . Hyperlipidemia   . GERD (gastroesophageal reflux disease)   . CAD (coronary artery disease)   . Diabetes mellitus type II, uncontrolled   . Mesenteric mass 07/2008    complicated by nausea & vomiting  . S/P PICC central line placement 10/2008    line sepsis-right PICC line removed     History   Social History  . Marital Status: Married    Spouse Name: N/A    Number of Children: N/A  . Years of Education: N/A   Occupational History  . Not on file.   Social History Main Topics  . Smoking status: Never Smoker   . Smokeless tobacco: Not on file  . Alcohol Use: Not on file  . Drug Use: Not on file  . Sexually Active: Not on file   Other Topics Concern  . 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 Total abdominal hysterectomy 1993  . Abdominal mass resection 07/2008    Mesentertic Mass resection    Family History  Problem Relation Age of Onset  . Coronary artery disease Mother   . Hypertension Mother   . Brain cancer Father 60    deceased  . Diabetes Sister   . Diabetes Brother      X 2    No Known Allergies  Current Outpatient Prescriptions on File Prior to Visit  Medication Sig Dispense Refill  . amLODipine (NORVASC) 5 MG tablet Take 1 tablet (5 mg total) by mouth daily.  90 tablet  1  . aspirin 325 MG tablet Take 325 mg by mouth daily.        Marland Kitchen atorvastatin (LIPITOR) 80 MG tablet Take 1 tablet (80 mg total) by mouth daily.  90 tablet  1  . benazepril (LOTENSIN) 20 MG tablet TAKE 1 TABLET DAILY  90 tablet  0  . glucose blood (ONE TOUCH TEST STRIPS) test strip Use as instructed to check blood sugar three times daily  100 each  1  .  Lysine HCl 500 MG TABS Take 500 mg by mouth daily.        . metFORMIN (GLUCOPHAGE-XR) 750 MG 24 hr tablet Take 1 tablet (750 mg total) by mouth 2 (two) times daily.  180 tablet  1  . ondansetron (ZOFRAN) 4 MG tablet Take 1 tablet (4 mg total) by mouth 2 (two) times daily as needed. For nausea  30 tablet  0  . pantoprazole (PROTONIX) 40 MG tablet Take 1 tablet (40 mg total) by mouth daily.  90 tablet  1    BP 124/84  Pulse 72  Temp(Src) 98 F (36.7 C) (Oral)  Wt 229 lb (103.874 kg)       Objective:   Physical Exam  Constitutional: She is oriented to person, place, and time. She appears well-developed and well-nourished.  HENT:  Head: Normocephalic and atraumatic.  Right Ear: External ear normal.  Left Ear: External ear normal.  Eyes: EOM are normal. Pupils are equal, round, and reactive to light.  Cardiovascular: Normal rate, regular rhythm and normal heart sounds.    Pulmonary/Chest: Effort normal and breath sounds normal. She has no wheezes. She has no rales.  Abdominal: Soft. Bowel sounds are normal.  Neurological: She is alert and oriented to person, place, and time. No cranial nerve deficit.       Assessment & Plan:

## 2012-03-07 ENCOUNTER — Other Ambulatory Visit: Payer: Self-pay | Admitting: Internal Medicine

## 2012-05-02 ENCOUNTER — Ambulatory Visit: Payer: BC Managed Care – PPO | Admitting: Internal Medicine

## 2012-05-08 ENCOUNTER — Other Ambulatory Visit: Payer: Self-pay | Admitting: Internal Medicine

## 2012-06-27 ENCOUNTER — Other Ambulatory Visit: Payer: Self-pay | Admitting: Internal Medicine

## 2012-08-11 ENCOUNTER — Other Ambulatory Visit: Payer: Self-pay | Admitting: Internal Medicine

## 2012-11-04 ENCOUNTER — Other Ambulatory Visit: Payer: Self-pay | Admitting: Internal Medicine

## 2012-11-22 ENCOUNTER — Other Ambulatory Visit (INDEPENDENT_AMBULATORY_CARE_PROVIDER_SITE_OTHER): Payer: BC Managed Care – PPO

## 2012-11-22 DIAGNOSIS — Z0000 Encounter for general adult medical examination without abnormal findings: Secondary | ICD-10-CM

## 2012-11-22 DIAGNOSIS — I10 Essential (primary) hypertension: Secondary | ICD-10-CM

## 2012-11-22 LAB — MICROALBUMIN / CREATININE URINE RATIO
Creatinine,U: 174.7 mg/dL
Microalb Creat Ratio: 0.9 mg/g (ref 0.0–30.0)
Microalb, Ur: 1.6 mg/dL (ref 0.0–1.9)

## 2012-11-22 LAB — POCT URINALYSIS DIPSTICK
Bilirubin, UA: NEGATIVE
Glucose, UA: NEGATIVE
Nitrite, UA: NEGATIVE
Urobilinogen, UA: 0.2

## 2012-11-22 LAB — CBC WITH DIFFERENTIAL/PLATELET
Basophils Relative: 1.1 % (ref 0.0–3.0)
HCT: 44.1 % (ref 36.0–46.0)
Hemoglobin: 14.6 g/dL (ref 12.0–15.0)
Lymphocytes Relative: 30.5 % (ref 12.0–46.0)
Lymphs Abs: 1.4 10*3/uL (ref 0.7–4.0)
MCHC: 33.2 g/dL (ref 30.0–36.0)
Monocytes Relative: 6.2 % (ref 3.0–12.0)
Neutro Abs: 2.7 10*3/uL (ref 1.4–7.7)
RBC: 5 Mil/uL (ref 3.87–5.11)
RDW: 13.7 % (ref 11.5–14.6)

## 2012-11-22 LAB — TSH: TSH: 1.75 u[IU]/mL (ref 0.35–5.50)

## 2012-11-22 LAB — LIPID PANEL
LDL Cholesterol: 113 mg/dL — ABNORMAL HIGH (ref 0–99)
Total CHOL/HDL Ratio: 3

## 2012-11-22 LAB — BASIC METABOLIC PANEL
BUN: 8 mg/dL (ref 6–23)
CO2: 28 mEq/L (ref 19–32)
Calcium: 9 mg/dL (ref 8.4–10.5)
Chloride: 105 mEq/L (ref 96–112)
Creatinine, Ser: 0.7 mg/dL (ref 0.4–1.2)

## 2012-11-22 LAB — HEPATIC FUNCTION PANEL
ALT: 14 U/L (ref 0–35)
Total Bilirubin: 0.5 mg/dL (ref 0.3–1.2)

## 2012-11-22 LAB — HEMOGLOBIN A1C: Hgb A1c MFr Bld: 6.8 % — ABNORMAL HIGH (ref 4.6–6.5)

## 2012-11-29 ENCOUNTER — Ambulatory Visit (INDEPENDENT_AMBULATORY_CARE_PROVIDER_SITE_OTHER): Payer: BC Managed Care – PPO | Admitting: Internal Medicine

## 2012-11-29 ENCOUNTER — Encounter: Payer: Self-pay | Admitting: Internal Medicine

## 2012-11-29 VITALS — BP 150/100 | HR 88 | Temp 97.5°F | Ht 66.5 in | Wt 233.0 lb

## 2012-11-29 DIAGNOSIS — I10 Essential (primary) hypertension: Secondary | ICD-10-CM

## 2012-11-29 MED ORDER — METFORMIN HCL ER 750 MG PO TB24: 750.0000 mg | ORAL_TABLET | Freq: Two times a day (BID) | ORAL | Status: DC

## 2012-11-29 MED ORDER — ONDANSETRON HCL 4 MG PO TABS: 4.0000 mg | ORAL_TABLET | Freq: Two times a day (BID) | ORAL | Status: DC | PRN

## 2012-11-29 MED ORDER — ATORVASTATIN CALCIUM 80 MG PO TABS: 80.0000 mg | ORAL_TABLET | Freq: Every day | ORAL | Status: DC

## 2012-11-29 MED ORDER — PANTOPRAZOLE SODIUM 40 MG PO TBEC: 40.0000 mg | DELAYED_RELEASE_TABLET | Freq: Every day | ORAL | Status: DC

## 2012-11-29 MED ORDER — LISINOPRIL 20 MG PO TABS: 20.0000 mg | ORAL_TABLET | Freq: Every day | ORAL | Status: DC

## 2012-11-29 MED ORDER — AMLODIPINE BESYLATE 5 MG PO TABS: 5.0000 mg | ORAL_TABLET | Freq: Every day | ORAL | Status: DC

## 2012-11-29 NOTE — Assessment & Plan Note (Signed)
The patient's blood pressure is suboptimally controlled. Discontinue benazepril. Change to lisinopril 20 mg once daily. Patient to monitor blood pressure at home. If she has any dizziness or low blood pressure, she understands to decrease lisinopril dose to 10 mg. Reassess in 2 months. BP: 150/100 mmHg  Lab Results  Component Value Date   CREATININE 0.7 11/22/2012

## 2012-11-29 NOTE — Patient Instructions (Signed)
Please complete the following lab tests before your next follow up appointment: BMET - 401.9 

## 2012-11-29 NOTE — Progress Notes (Signed)
Subjective:    Patient ID: Terri [name], female    DOB: 1951-06-16, 62 y.o.   MRN: [id number]  HPI  62 year old African American female with type 2 diabetes, hypertension and hyperlipidemia for routine followup. Patient has had mild weight gain since previous visit. She gained 4 pounds. Patient's A1c is stable.  She denies any visual changes. She is due for her yearly diabetic eye exam. She denies any numbness and tingling in her extremities.  Hyperlipidemia-her compliance with Lipitor sporadic. She is concerned about potential side effects.  Hypertension - she's been compliant with taking benazepril 20 mg amlodipine 5 mg.  Review of Systems Mild weight gain, negative for chest pain  Past Medical History  Diagnosis Date  . Abnormal coagulation profile   . Spontaneous ecchymoses   . Fever, unspecified   . Routine general medical examination at a health care facility   . Other postoperative infection   . Abdominal mass   . Abdominal pain   . Headache   . Hypertension   . Hyperlipidemia   . GERD (gastroesophageal reflux disease)   . CAD (coronary artery disease)   . Diabetes mellitus type II, uncontrolled   . Mesenteric mass 07/2008    complicated by nausea & vomiting  . S/P PICC central line placement 10/2008    line sepsis-right PICC line removed     History   Social History  . Marital Status: Married    Spouse Name: N/A    Number of Children: N/A  . Years of Education: N/A   Occupational History  . Not on file.   Social History Main Topics  . Smoking status: Never Smoker   . Smokeless tobacco: Not on file  . Alcohol Use: Not on file  . Drug Use: Not on file  . Sexually Active: Not on file   Other Topics Concern  . 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 Total abdominal  hysterectomy  1993  . Abdominal mass resection  07/2008    Mesentertic Mass resection    Family History  Problem Relation Age of Onset  . Coronary artery disease Mother   . Hypertension Mother   . Brain cancer Father 53    deceased  . Diabetes Sister   . Diabetes Brother      X 2    No Known Allergies  Current Outpatient Prescriptions on File Prior to Visit  Medication Sig Dispense Refill  . aspirin 325 MG tablet Take 325 mg by mouth daily.        Marland Kitchen glucose blood (ONE TOUCH TEST STRIPS) test strip Use as instructed to check blood sugar three times daily  100 each  1  . Lysine HCl 500 MG TABS Take 1 tablet (500 mg total) by mouth daily.  90 tablet  1   No current facility-administered medications on file prior to visit.    BP 150/100  Pulse 88  Temp(Src) 97.5 F (36.4 C) (Oral)  Ht 5' 6.5" (1.689 m)  Wt 233 lb (105.688 kg)  BMI 37.05 kg/m2       Objective:   Physical Exam  Constitutional: She is oriented to person, place, and  time. She appears well-developed and well-nourished.  HENT:  Head: Normocephalic and atraumatic.  Right Ear: External ear normal.  Left Ear: External ear normal.  Mouth/Throat: No oropharyngeal exudate.  Eyes: EOM are normal. Pupils are equal, round, and reactive to light.  Neck: Neck supple.  No carotid bruit  Cardiovascular: Normal rate, regular rhythm and normal heart sounds.   No murmur heard. Pulmonary/Chest: Effort normal and breath sounds normal. She has no wheezes.  Neurological: She is alert and oriented to person, place, and time. No cranial nerve deficit.  Psychiatric: She has a normal mood and affect. Her behavior is normal.          Assessment & Plan:

## 2012-11-29 NOTE — Assessment & Plan Note (Signed)
A1c is stable. She's had mild weight gain. I encouraged regular exercise. Continue same dose of metformin 750 mg twice daily. Lab Results  Component Value Date   HGBA1C 6.8* 11/22/2012

## 2012-11-29 NOTE — Assessment & Plan Note (Signed)
Continue Lipitor.   Lab Results  Component Value Date   CHOL 184 11/22/2012   HDL 56.10 11/22/2012   LDLCALC 113* 11/22/2012   TRIG 74.0 11/22/2012   CHOLHDL 3 11/22/2012   Lab Results  Component Value Date   ALT 14 11/22/2012   AST 15 11/22/2012   ALKPHOS 86 11/22/2012   BILITOT 0.5 11/22/2012

## 2012-12-12 ENCOUNTER — Encounter (INDEPENDENT_AMBULATORY_CARE_PROVIDER_SITE_OTHER): Payer: BC Managed Care – PPO | Admitting: Ophthalmology

## 2012-12-12 DIAGNOSIS — H43819 Vitreous degeneration, unspecified eye: Secondary | ICD-10-CM

## 2012-12-12 DIAGNOSIS — I10 Essential (primary) hypertension: Secondary | ICD-10-CM

## 2012-12-12 DIAGNOSIS — E11319 Type 2 diabetes mellitus with unspecified diabetic retinopathy without macular edema: Secondary | ICD-10-CM

## 2012-12-12 DIAGNOSIS — H35039 Hypertensive retinopathy, unspecified eye: Secondary | ICD-10-CM

## 2012-12-12 DIAGNOSIS — E1139 Type 2 diabetes mellitus with other diabetic ophthalmic complication: Secondary | ICD-10-CM

## 2012-12-12 DIAGNOSIS — E1165 Type 2 diabetes mellitus with hyperglycemia: Secondary | ICD-10-CM

## 2013-01-23 ENCOUNTER — Other Ambulatory Visit: Payer: BC Managed Care – PPO

## 2013-01-30 ENCOUNTER — Ambulatory Visit: Payer: BC Managed Care – PPO | Admitting: Internal Medicine

## 2013-02-22 ENCOUNTER — Other Ambulatory Visit: Payer: Self-pay

## 2013-02-22 ENCOUNTER — Other Ambulatory Visit: Payer: Self-pay | Admitting: Internal Medicine

## 2013-02-22 DIAGNOSIS — Z1231 Encounter for screening mammogram for malignant neoplasm of breast: Secondary | ICD-10-CM

## 2013-02-23 ENCOUNTER — Ambulatory Visit (HOSPITAL_COMMUNITY)
Admission: RE | Admit: 2013-02-23 | Discharge: 2013-02-23 | Disposition: A | Discharge status: Home / Self Care | Payer: BC Managed Care – HMO | Source: Ambulatory Visit | Attending: Internal Medicine | Admitting: Internal Medicine

## 2013-02-23 DIAGNOSIS — Z1231 Encounter for screening mammogram for malignant neoplasm of breast: Secondary | ICD-10-CM | POA: Insufficient documentation

## 2013-03-16 ENCOUNTER — Other Ambulatory Visit: Payer: Self-pay | Admitting: Internal Medicine

## 2013-03-23 ENCOUNTER — Ambulatory Visit: Payer: BC Managed Care – PPO

## 2013-04-06 ENCOUNTER — Other Ambulatory Visit: Payer: Self-pay

## 2013-05-19 ENCOUNTER — Other Ambulatory Visit: Payer: Self-pay | Admitting: Internal Medicine

## 2013-06-14 ENCOUNTER — Other Ambulatory Visit: Payer: Self-pay | Admitting: Internal Medicine

## 2013-10-02 ENCOUNTER — Encounter: Payer: Self-pay | Admitting: Internal Medicine

## 2013-10-02 ENCOUNTER — Ambulatory Visit (INDEPENDENT_AMBULATORY_CARE_PROVIDER_SITE_OTHER): Payer: BC Managed Care – HMO | Admitting: Internal Medicine

## 2013-10-02 VITALS — BP 146/100 | HR 72 | Temp 97.9°F | Ht 66.5 in | Wt 234.0 lb

## 2013-10-02 DIAGNOSIS — K219 Gastro-esophageal reflux disease without esophagitis: Secondary | ICD-10-CM

## 2013-10-02 DIAGNOSIS — E785 Hyperlipidemia, unspecified: Secondary | ICD-10-CM

## 2013-10-02 DIAGNOSIS — T753XXA Motion sickness, initial encounter: Secondary | ICD-10-CM

## 2013-10-02 DIAGNOSIS — I10 Essential (primary) hypertension: Secondary | ICD-10-CM

## 2013-10-02 DIAGNOSIS — E119 Type 2 diabetes mellitus without complications: Secondary | ICD-10-CM

## 2013-10-02 MED ORDER — AMLODIPINE BESYLATE 10 MG PO TABS: 10.0000 mg | ORAL_TABLET | Freq: Every day | ORAL | Status: DC

## 2013-10-02 MED ORDER — ATORVASTATIN CALCIUM 80 MG PO TABS: 80.0000 mg | ORAL_TABLET | Freq: Every day | ORAL | Status: DC

## 2013-10-02 MED ORDER — METFORMIN HCL ER 750 MG PO TB24: 750.0000 mg | ORAL_TABLET | Freq: Two times a day (BID) | ORAL | Status: DC

## 2013-10-02 MED ORDER — PANTOPRAZOLE SODIUM 40 MG PO TBEC: 40.0000 mg | DELAYED_RELEASE_TABLET | Freq: Every day | ORAL | Status: DC

## 2013-10-02 MED ORDER — LISINOPRIL 20 MG PO TABS: 20.0000 mg | ORAL_TABLET | Freq: Every day | ORAL | Status: DC

## 2013-10-02 MED ORDER — SCOPOLAMINE 1 MG/3DAYS TD PT72: 1.0000 | MEDICATED_PATCH | TRANSDERMAL | Status: DC

## 2013-10-02 NOTE — Progress Notes (Signed)
Subjective:    Patient ID: Terri PascalLillie L [name], female    DOB: 09-Oct-1950, 63 y.o.   MRN: [id number]  HPI  63 year old [place] female with history of hypertension, hyperlipidemia and type 2 diabetes for routine followup. Patient denies any significant interval medical history. Her weight is relatively stable. She reports good compliance with her antihypertensives.  She checks her blood sugars intermittently at home. Her readings are usually in the low 120s. Patient follows a low carb diet but is unable to avoid sweets.  Hyperlipidemia-tolerating atorvastatin without difficulty.  Patient planning to go on a cruise and requests prescription for scopolamine patch.  Review of Systems Negative for chest pain or shortness of breath.  Negative for paresthesias    Past Medical History  Diagnosis Date  . Abnormal coagulation profile   . Spontaneous ecchymoses   . Fever, unspecified   . Routine general medical examination at a health care facility   . Other postoperative infection   . Abdominal mass   . Abdominal pain   . Headache(784.0)   . Hypertension   . Hyperlipidemia   . GERD (gastroesophageal reflux disease)   . CAD (coronary artery disease)   . Diabetes mellitus type II, uncontrolled   . Mesenteric mass 07/2008    complicated by nausea & vomiting  . S/P PICC central line placement 10/2008    line sepsis-right PICC line removed     History   Social History  . Marital Status: Married    Spouse Name: N/A    Number of Children: N/A  . Years of Education: N/A   Occupational History  . Not on file.   Social History Main Topics  . Smoking status: Never Smoker   . Smokeless tobacco: Not on file  . Alcohol Use: Not on file  . Drug Use: Not on file  . Sexual Activity: Not on file   Other Topics Concern  . 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 Total abdominal hysterectomy  1993  . Abdominal mass resection  07/2008    Mesentertic Mass resection    Family History  Problem Relation Age of Onset  . Coronary artery disease Mother   . Hypertension Mother   . Brain cancer Father 3674    deceased  . Diabetes Sister   . Diabetes Brother      X 2    No Known Allergies  Current Outpatient Prescriptions on File Prior to Visit  Medication Sig Dispense Refill  . aspirin 325 MG tablet Take 325 mg by mouth daily.        Marland Kitchen. glucose blood (ONE TOUCH TEST STRIPS) test strip Use as instructed to check blood sugar three times daily  100 each  1  . Lysine HCl 500 MG TABS Take 1 tablet (500 mg total) by mouth daily.  90 tablet  1  . ondansetron (ZOFRAN) 4 MG tablet take 1 tablet by mouth twice a day if needed for nausea  30 tablet  0   No current facility-administered medications on file prior to visit.    BP 146/100  Pulse 72  Temp(Src) 97.9 F (36.6 C) (Oral)  Ht  5' 6.5" (1.689 m)  Wt 234 lb (106.142 kg)  BMI 37.21 kg/m2    Objective:   Physical Exam  Constitutional: She is oriented to person, place, and time. She appears well-developed and well-nourished. No distress.  HENT:  Head: Normocephalic and atraumatic.  Neck: Neck supple.  No carotid bruit  Cardiovascular: Normal rate, regular rhythm, normal heart sounds and intact distal pulses.   No murmur heard. Pulmonary/Chest: Effort normal and breath sounds normal. She has no wheezes.  Musculoskeletal: She exhibits no edema.  Neurological: She is alert and oriented to person, place, and time. No cranial nerve deficit.  Skin: Skin is warm.  See diabetic foot exam  Psychiatric: She has a normal mood and affect. Her behavior is normal.          Assessment & Plan:

## 2013-10-02 NOTE — Assessment & Plan Note (Signed)
Patient having difficulty avoiding sweets. Continue same dose of metformin 750 mg twice daily. If A1c greater than 7 we discussed restarting Victoza in addition to metformin. Weight loss (low carb diet and regular exercise) strongly encouraged.

## 2013-10-02 NOTE — Assessment & Plan Note (Signed)
Stable.  Monitor FLP and LFTs

## 2013-10-02 NOTE — Assessment & Plan Note (Signed)
Patient planning to go on cruise.  Use scopolamine patches as directed. We discussed common side effects.

## 2013-10-02 NOTE — Assessment & Plan Note (Signed)
Patient's blood pressure still suboptimally controlled. Increase amlodipine to 10 mg. Continue same dose of lisinopril 20 mg. Monitor electrolytes and kidney function. BP: 146/100 mmHg

## 2013-10-02 NOTE — Progress Notes (Signed)
Pre visit review using our clinic review tool, if applicable. No additional management support is needed unless otherwise documented below in the visit note. 

## 2013-10-03 LAB — BASIC METABOLIC PANEL
BUN: 9 mg/dL (ref 6–23)
CALCIUM: 9.8 mg/dL (ref 8.4–10.5)
CO2: 30 meq/L (ref 19–32)
Chloride: 102 mEq/L (ref 96–112)
Creatinine, Ser: 0.8 mg/dL (ref 0.4–1.2)
GFR: 88.97 mL/min (ref >60.00)
GLUCOSE: 103 mg/dL — AB (ref 70–99)
Potassium: 4.5 mEq/L (ref 3.5–5.1)
SODIUM: 139 meq/L (ref 135–145)

## 2013-10-03 LAB — MICROALBUMIN / CREATININE URINE RATIO
CREATININE, U: 62.5 mg/dL
Microalb Creat Ratio: 0.6 mg/g (ref 0.0–30.0)
Microalb, Ur: 0.4 mg/dL (ref 0.0–1.9)

## 2013-10-03 LAB — HEPATIC FUNCTION PANEL
ALT: 20 U/L (ref 0–35)
AST: 17 U/L (ref 0–37)
Albumin: 4.3 g/dL (ref 3.5–5.2)
Alkaline Phosphatase: 88 U/L (ref 39–117)
BILIRUBIN TOTAL: 0.5 mg/dL (ref 0.3–1.2)
Bilirubin, Direct: 0 mg/dL (ref 0.0–0.3)
TOTAL PROTEIN: 7.4 g/dL (ref 6.0–8.3)

## 2013-10-03 LAB — LDL CHOLESTEROL, DIRECT: Direct LDL: 150.9 mg/dL

## 2013-10-03 LAB — LIPID PANEL
Cholesterol: 227 mg/dL — ABNORMAL HIGH (ref 0–200)
HDL: 56.4 mg/dL (ref >39.00)
TRIGLYCERIDES: 186 mg/dL — AB (ref 0.0–149.0)
Total CHOL/HDL Ratio: 4
VLDL: 37.2 mg/dL (ref 0.0–40.0)

## 2013-10-03 LAB — HEMOGLOBIN A1C: Hgb A1c MFr Bld: 7.2 % — ABNORMAL HIGH (ref 4.6–6.5)

## 2013-11-22 ENCOUNTER — Other Ambulatory Visit (INDEPENDENT_AMBULATORY_CARE_PROVIDER_SITE_OTHER): Payer: BC Managed Care – HMO

## 2013-11-22 DIAGNOSIS — E119 Type 2 diabetes mellitus without complications: Secondary | ICD-10-CM

## 2013-11-22 DIAGNOSIS — E785 Hyperlipidemia, unspecified: Secondary | ICD-10-CM

## 2013-11-22 LAB — BASIC METABOLIC PANEL
BUN: 11 mg/dL (ref 6–23)
CHLORIDE: 105 meq/L (ref 96–112)
CO2: 32 mEq/L (ref 19–32)
CREATININE: 0.8 mg/dL (ref 0.4–1.2)
Calcium: 9.8 mg/dL (ref 8.4–10.5)
GFR: 99.97 mL/min (ref >60.00)
Glucose, Bld: 114 mg/dL — ABNORMAL HIGH (ref 70–99)
POTASSIUM: 4.5 meq/L (ref 3.5–5.1)
Sodium: 141 mEq/L (ref 135–145)

## 2013-11-22 LAB — MICROALBUMIN / CREATININE URINE RATIO
Creatinine,U: 69.1 mg/dL
Microalb Creat Ratio: 0.3 mg/g (ref 0.0–30.0)
Microalb, Ur: 0.2 mg/dL (ref 0.0–1.9)

## 2013-11-22 LAB — LIPID PANEL
CHOL/HDL RATIO: 2
Cholesterol: 124 mg/dL (ref 0–200)
HDL: 49.7 mg/dL (ref >39.00)
LDL CALC: 57 mg/dL (ref 0–99)
TRIGLYCERIDES: 85 mg/dL (ref 0.0–149.0)
VLDL: 17 mg/dL (ref 0.0–40.0)

## 2013-11-22 LAB — HEPATIC FUNCTION PANEL
ALT: 17 U/L (ref 0–35)
AST: 17 U/L (ref 0–37)
Albumin: 4 g/dL (ref 3.5–5.2)
Alkaline Phosphatase: 81 U/L (ref 39–117)
Bilirubin, Direct: 0.1 mg/dL (ref 0.0–0.3)
Total Bilirubin: 0.7 mg/dL (ref 0.3–1.2)
Total Protein: 7.4 g/dL (ref 6.0–8.3)

## 2013-11-22 LAB — LDL CHOLESTEROL, DIRECT: Direct LDL: 63.3 mg/dL

## 2013-11-22 LAB — HEMOGLOBIN A1C: HEMOGLOBIN A1C: 7.4 % — AB (ref 4.6–6.5)

## 2013-11-29 ENCOUNTER — Ambulatory Visit: Payer: BC Managed Care – HMO | Admitting: Internal Medicine

## 2013-12-08 ENCOUNTER — Encounter: Payer: Self-pay | Admitting: Internal Medicine

## 2013-12-08 ENCOUNTER — Ambulatory Visit (INDEPENDENT_AMBULATORY_CARE_PROVIDER_SITE_OTHER): Payer: BC Managed Care – HMO | Admitting: Internal Medicine

## 2013-12-08 VITALS — BP 132/82 | HR 76 | Temp 97.8°F | Ht 66.5 in | Wt 230.0 lb

## 2013-12-08 DIAGNOSIS — E785 Hyperlipidemia, unspecified: Secondary | ICD-10-CM

## 2013-12-08 DIAGNOSIS — E1165 Type 2 diabetes mellitus with hyperglycemia: Secondary | ICD-10-CM

## 2013-12-08 DIAGNOSIS — E119 Type 2 diabetes mellitus without complications: Secondary | ICD-10-CM

## 2013-12-08 DIAGNOSIS — I10 Essential (primary) hypertension: Secondary | ICD-10-CM

## 2013-12-08 DIAGNOSIS — IMO0001 Reserved for inherently not codable concepts without codable children: Secondary | ICD-10-CM

## 2013-12-08 MED ORDER — METFORMIN HCL ER (OSM) 1000 MG PO TB24: 1000.0000 mg | ORAL_TABLET | Freq: Two times a day (BID) | ORAL | Status: DC

## 2013-12-08 NOTE — Assessment & Plan Note (Addendum)
Decrease atorvastatin to 40 mg. It is unclear whether high dose statin exacerbating type II diabetes.

## 2013-12-08 NOTE — Progress Notes (Signed)
Subjective:    Patient ID: Terri [name], female    DOB: 12-26-1950, 63 y.o.   MRN: [id number]  HPI  63 year old [place] female with history of hypertension, type 2 diabetes and coronary artery disease for followup. Patient's blood pressure improved since previous visit.  Patient feels that she is following a healthier diet but A1c slightly worse.  Good compliance with metformin.  Review of Systems Negative for chest pain.    Past Medical History  Diagnosis Date  . Abnormal coagulation profile   . Spontaneous ecchymoses   . Fever, unspecified   . Routine general medical examination at a health care facility   . Other postoperative infection   . Abdominal mass   . Abdominal pain   . Headache(784.0)   . Hypertension   . Hyperlipidemia   . GERD (gastroesophageal reflux disease)   . CAD (coronary artery disease)   . Diabetes mellitus type II, uncontrolled   . Mesenteric mass 07/2008    complicated by nausea & vomiting  . S/P PICC central line placement 10/2008    line sepsis-right PICC line removed     History   Social History  . Marital Status: Married    Spouse Name: N/A    Number of Children: N/A  . Years of Education: N/A   Occupational History  . Not on file.   Social History Main Topics  . Smoking status: Never Smoker   . Smokeless tobacco: Not on file  . Alcohol Use: Not on file  . Drug Use: Not on file  . Sexual Activity: Not on file   Other Topics Concern  . 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 Total abdominal hysterectomy  1993  . Abdominal mass resection  07/2008    Mesentertic Mass resection    Family History  Problem Relation Age of Onset  . Coronary artery disease Mother   . Hypertension Mother   . Brain cancer Father [id number]    deceased  .  Diabetes Sister   . Diabetes Brother      X 2    No Known Allergies  Current Outpatient Prescriptions on File Prior to Visit  Medication Sig Dispense Refill  . amLODipine (NORVASC) 10 MG tablet Take 1 tablet (10 mg total) by mouth daily.  90 tablet  1  . glucose blood (ONE TOUCH TEST STRIPS) test strip Use as instructed to check blood sugar three times daily  100 each  1  . lisinopril (PRINIVIL,ZESTRIL) 20 MG tablet Take 1 tablet (20 mg total) by mouth daily.  90 tablet  1  . Lysine HCl 500 MG TABS Take 1 tablet (500 mg total) by mouth daily.  90 tablet  1  . ondansetron (ZOFRAN) 4 MG tablet take 1 tablet by mouth twice a day if needed for nausea  30 tablet  0  . pantoprazole (PROTONIX) 40 MG tablet Take 1 tablet (40 mg total) by mouth daily.  90 tablet  1  . scopolamine (TRANSDERM-SCOP) 1.5 MG Place 1 patch (1.5 mg total) onto the skin every 3 (three) days.  10 patch  0   No current facility-administered medications on file  prior to visit.    BP 132/82  Pulse 76  Temp(Src) 97.8 F (36.6 C) (Oral)  Ht 5' 6.5" (1.689 m)  Wt 230 lb (104.327 kg)  BMI 36.57 kg/m2    Objective:   Physical Exam  Constitutional: She is oriented to person, place, and time. She appears well-developed and well-nourished. No distress.  Neck: Neck supple.  No carotid bruit  Cardiovascular: Normal rate, regular rhythm and normal heart sounds.   No murmur heard. Pulmonary/Chest: Effort normal and breath sounds normal. She has no wheezes.  Musculoskeletal: She exhibits no edema.  Lymphadenopathy:    She has no cervical adenopathy.  Neurological: She is alert and oriented to person, place, and time. No cranial nerve deficit.  Psychiatric: She has a normal mood and affect. Her behavior is normal.      Assessment & Plan:

## 2013-12-08 NOTE — Assessment & Plan Note (Signed)
Improved with higher dose of amlodipine.  No change in medication regimen. BP: 132/82 mmHg

## 2013-12-08 NOTE — Patient Instructions (Signed)
Please complete the following lab tests before your next follow up appointment:  BMET, A1c - 250.02 

## 2013-12-08 NOTE — Assessment & Plan Note (Signed)
Increase metformin to 1000 mg bid.  Defer adding Victoza for now. Lab Results  Component Value Date   HGBA1C 7.4* 11/22/2013

## 2013-12-08 NOTE — Progress Notes (Signed)
Pre visit review using our clinic review tool, if applicable. No additional management support is needed unless otherwise documented below in the visit note. 

## 2013-12-12 ENCOUNTER — Ambulatory Visit (INDEPENDENT_AMBULATORY_CARE_PROVIDER_SITE_OTHER): Payer: Self-pay | Admitting: Ophthalmology

## 2014-01-19 ENCOUNTER — Encounter: Payer: Self-pay | Admitting: Internal Medicine

## 2014-01-19 ENCOUNTER — Ambulatory Visit (INDEPENDENT_AMBULATORY_CARE_PROVIDER_SITE_OTHER): Payer: BC Managed Care – HMO | Admitting: Internal Medicine

## 2014-01-19 VITALS — BP 132/94 | Temp 98.1°F | Ht 66.5 in | Wt 225.0 lb

## 2014-01-19 DIAGNOSIS — R110 Nausea: Secondary | ICD-10-CM | POA: Insufficient documentation

## 2014-01-19 DIAGNOSIS — E119 Type 2 diabetes mellitus without complications: Secondary | ICD-10-CM

## 2014-01-19 MED ORDER — PROMETHAZINE HCL 12.5 MG PO TABS: 12.5000 mg | ORAL_TABLET | Freq: Two times a day (BID) | ORAL | Status: DC | PRN

## 2014-01-19 NOTE — Assessment & Plan Note (Addendum)
63 year old African American female complains of increased nausea and belching/gas for 1 month.  Patient likely has side effects from higher dose of metformin. Patient advised to hold the medication for the next 2 or 3 days to see if her symptoms resolve. If her symptoms do not resolve we discussed initiating workup including obtaining blood work-liver function tests, serum lipase and CBCD.  Also obtain abdominal ultrasound with focus on right upper quadrant to rule out gallstones.  Use phenergen 12.5 mg twice daily as needed.

## 2014-01-19 NOTE — Progress Notes (Signed)
Pre visit review using our clinic review tool, if applicable. No additional management support is needed unless otherwise documented below in the visit note. 

## 2014-01-19 NOTE — Patient Instructions (Signed)
Please contact our office if your symptoms do not improve or gets worse.  

## 2014-01-19 NOTE — Progress Notes (Signed)
Subjective:    Patient ID: Terri [name], female    DOB: 1950/10/07, 63 y.o.   MRN: [id number]  HPI  63 year old [place] female with history of coronary artery disease, hypertension and type 2 diabetes for followup. At previous visit her metformin dose was increased to metformin SR 1000 mg twice daily. Patient reports she has been experiencing increase in gas and nausea for one month. Patient's symptoms worse during the first 2 weeks. Over the last 2 weeks her symptoms have been intermittent. She is only had one episode of mild vomiting. She has intermittent cramping type abdominal pain.  She reports her sister had similar symptoms with gallbladder attack.  Her symptoms are related to fatty meals.  Review of Systems Negative for fever chills, negative for diarrhea    Past Medical History  Diagnosis Date  . Abnormal coagulation profile   . Other postoperative infection   . Abdominal mass   . Hypertension   . Hyperlipidemia   . GERD (gastroesophageal reflux disease)   . CAD (coronary artery disease)   . Diabetes mellitus type II, uncontrolled   . Mesenteric mass 07/2008    complicated by nausea & vomiting  . S/P PICC central line placement 10/2008    line sepsis-right PICC line removed     History   Social History  . Marital Status: Married    Spouse Name: N/A    Number of Children: N/A  . Years of Education: N/A   Occupational History  . Not on file.   Social History Main Topics  . Smoking status: Never Smoker   . Smokeless tobacco: Not on file  . Alcohol Use: Not on file  . Drug Use: Not on file  . Sexual Activity: Not on file   Other Topics Concern  . 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 Total abdominal hysterectomy  1993  . Abdominal mass resection  07/2008   Mesentertic Mass resection    Family History  Problem Relation Age of Onset  . Coronary artery disease Mother   . Hypertension Mother   . Brain cancer Father [phone]    deceased  . Diabetes Sister   . Diabetes Brother      X 2    No Known Allergies  Current Outpatient Prescriptions on File Prior to Visit  Medication Sig Dispense Refill  . amLODipine (NORVASC) 10 MG tablet Take 1 tablet (10 mg total) by mouth daily.  90 tablet  1  . aspirin 81 MG tablet Take 81 mg by mouth daily.      Marland Kitchen. atorvastatin (LIPITOR) 80 MG tablet Take 0.5 tablets (40 mg total) by mouth daily.  90 tablet  1  . glucose blood (ONE TOUCH TEST STRIPS) test strip Use as instructed to check blood sugar three times daily  100 each  1  . lisinopril (PRINIVIL,ZESTRIL) 20 MG tablet Take 1 tablet (20 mg total) by mouth daily.  90 tablet  1  . Lysine HCl 500 MG TABS Take 1 tablet (500 mg total) by mouth daily.  90 tablet  1  . ondansetron (ZOFRAN) 4 MG tablet take 1 tablet by mouth twice a  day if needed for nausea  30 tablet  0  . pantoprazole (PROTONIX) 40 MG tablet Take 1 tablet (40 mg total) by mouth daily.  90 tablet  1   No current facility-administered medications on file prior to visit.    BP 132/94  Temp(Src) 98.1 F (36.7 C) (Oral)  Ht 5' 6.5" (1.689 m)  Wt 225 lb (102.059 kg)  BMI 35.78 kg/m2    Objective:   Physical Exam  Constitutional: She is oriented to person, place, and time. She appears well-developed and well-nourished. No distress.  HENT:  Head: Normocephalic and atraumatic.  Cardiovascular: Normal rate, regular rhythm and normal heart sounds.   No murmur heard. Pulmonary/Chest: Effort normal and breath sounds normal. She has no wheezes.  Abdominal: Soft. Bowel sounds are normal. She exhibits no mass. There is no rebound and no guarding.  Abdomen is slightly distended,  Mild diffuse nonfocal abdominal tenderness  Neurological: She is alert and oriented to person, place, and time. No cranial  nerve deficit.  Skin: Skin is warm and dry.  Psychiatric: She has a normal mood and affect. Her behavior is normal.          Assessment & Plan:

## 2014-03-06 ENCOUNTER — Other Ambulatory Visit (INDEPENDENT_AMBULATORY_CARE_PROVIDER_SITE_OTHER): Payer: BC Managed Care – HMO

## 2014-03-06 DIAGNOSIS — E1165 Type 2 diabetes mellitus with hyperglycemia: Principal | ICD-10-CM

## 2014-03-06 DIAGNOSIS — IMO0001 Reserved for inherently not codable concepts without codable children: Secondary | ICD-10-CM

## 2014-03-06 LAB — BASIC METABOLIC PANEL
BUN: 7 mg/dL (ref 6–23)
CHLORIDE: 104 meq/L (ref 96–112)
CO2: 30 mEq/L (ref 19–32)
CREATININE: 0.6 mg/dL (ref 0.4–1.2)
Calcium: 9.3 mg/dL (ref 8.4–10.5)
GFR: 122.14 mL/min (ref >60.00)
Glucose, Bld: 107 mg/dL — ABNORMAL HIGH (ref 70–99)
POTASSIUM: 4.1 meq/L (ref 3.5–5.1)
Sodium: 142 mEq/L (ref 135–145)

## 2014-03-06 LAB — HEMOGLOBIN A1C: HEMOGLOBIN A1C: 7.1 % — AB (ref 4.6–6.5)

## 2014-03-09 ENCOUNTER — Other Ambulatory Visit: Payer: BC Managed Care – HMO

## 2014-03-16 ENCOUNTER — Encounter: Payer: Self-pay | Admitting: Internal Medicine

## 2014-03-16 ENCOUNTER — Ambulatory Visit (INDEPENDENT_AMBULATORY_CARE_PROVIDER_SITE_OTHER): Payer: BC Managed Care – HMO | Admitting: Internal Medicine

## 2014-03-16 VITALS — BP 134/82 | HR 72 | Temp 98.0°F | Ht 66.5 in | Wt 228.0 lb

## 2014-03-16 DIAGNOSIS — K219 Gastro-esophageal reflux disease without esophagitis: Secondary | ICD-10-CM

## 2014-03-16 DIAGNOSIS — IMO0001 Reserved for inherently not codable concepts without codable children: Secondary | ICD-10-CM

## 2014-03-16 DIAGNOSIS — E785 Hyperlipidemia, unspecified: Secondary | ICD-10-CM

## 2014-03-16 DIAGNOSIS — E1165 Type 2 diabetes mellitus with hyperglycemia: Secondary | ICD-10-CM

## 2014-03-16 MED ORDER — LISINOPRIL 20 MG PO TABS: 20.0000 mg | ORAL_TABLET | Freq: Every day | ORAL | Status: DC

## 2014-03-16 MED ORDER — AMLODIPINE BESYLATE 10 MG PO TABS: 10.0000 mg | ORAL_TABLET | Freq: Every day | ORAL | Status: DC

## 2014-03-16 MED ORDER — PANTOPRAZOLE SODIUM 40 MG PO TBEC: 40.0000 mg | DELAYED_RELEASE_TABLET | Freq: Every day | ORAL | Status: DC

## 2014-03-16 NOTE — Progress Notes (Signed)
Pre visit review using our clinic review tool, if applicable. No additional management support is needed unless otherwise documented below in the visit note. 

## 2014-03-16 NOTE — Progress Notes (Signed)
Subjective:    Patient ID: Terri PascalLillie L [name], female    DOB: 1951-01-27, 63 y.o.   MRN: [id number]  HPI  63 year old [place] female with history of coronary artery disease, hypertension and type 2 diabetes for followup. At previous visit patient was experiencing gastrointestinal side effects from higher dose of metformin. She complained of increasing gas and nausea for one month. Since resuming her previous dose of metformin xr 750 mg twice daily her gastrointestinal symptoms significantly improved.  Her A1c is stable. Dietary compliance is fair. She does not exercise on a regular basis.  Review of Systems Negative for weight gain    Past Medical History  Diagnosis Date  . Abnormal coagulation profile   . Other postoperative infection   . Abdominal mass   . Hypertension   . Hyperlipidemia   . GERD (gastroesophageal reflux disease)   . CAD (coronary artery disease)   . Diabetes mellitus type II, uncontrolled   . Mesenteric mass 07/2008    complicated by nausea & vomiting  . S/P PICC central line placement 10/2008    line sepsis-right PICC line removed     History   Social History  . Marital Status: Married    Spouse Name: N/A    Number of Children: N/A  . Years of Education: N/A   Occupational History  . Not on file.   Social History Main Topics  . Smoking status: Never Smoker   . Smokeless tobacco: Not on file  . Alcohol Use: Not on file  . Drug Use: Not on file  . Sexual Activity: Not on file   Other Topics Concern  . 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 Total abdominal hysterectomy  1993  . Abdominal mass resection  07/2008    Mesentertic Mass resection    Family History  Problem Relation Age of Onset  . Coronary artery disease Mother   . Hypertension Mother     . Brain cancer Father [id number]    deceased  . Diabetes Sister   . Diabetes Brother      X 2    No Known Allergies  Current Outpatient Prescriptions on File Prior to Visit  Medication Sig Dispense Refill  . aspirin 81 MG tablet Take 81 mg by mouth daily.      Marland Kitchen. glucose blood (ONE TOUCH TEST STRIPS) test strip Use as instructed to check blood sugar three times daily  100 each  1  . Lysine HCl 500 MG TABS Take 1 tablet (500 mg total) by mouth daily.  90 tablet  1  . metformin (GLUCOPHAGE-XR) 750 MG 24 hr tablet Take 1 tablet (750 mg total) by mouth 2 (two) times daily with a meal.  180 tablet  1   No current facility-administered medications on file prior to visit.    BP 134/82  Pulse 72  Temp(Src) 98 F (36.7 C) (Oral)  Ht 5' 6.5" (1.689 m)  Wt 228 lb (103.42 kg)  BMI 36.25 kg/m2    Objective:   Physical Exam  Constitutional: She is oriented to person, place, and time. She appears well-developed and well-nourished. No distress.  Cardiovascular: Normal rate, regular  rhythm and normal heart sounds.   No murmur heard. Pulmonary/Chest: Effort normal and breath sounds normal. She has no wheezes.  Musculoskeletal:  Trace lower extremity edema  Neurological: She is alert and oriented to person, place, and time. No cranial nerve deficit.  Psychiatric: She has a normal mood and affect.        Assessment & Plan:

## 2014-03-16 NOTE — Patient Instructions (Signed)
Please complete the following lab tests before your next follow up appointment:  BMET, A1c - 250.02 

## 2014-03-16 NOTE — Assessment & Plan Note (Signed)
Reduce dose of atorvastatin 20 mg as it may be exacerbating diabetes control.

## 2014-03-16 NOTE — Assessment & Plan Note (Signed)
Patient could not tolerate higher dose of metformin due to gastrointestinal side effects. Continue metformin next are 750 mg twice daily. We discussed trying additional lifestyle measures before adding additional oral agent for diabetes.  Lab Results  Component Value Date   HGBA1C 7.1* 03/06/2014   Lab Results  Component Value Date   CREATININE 0.6 03/06/2014

## 2014-03-18 LAB — HM DIABETES EYE EXAM

## 2014-08-14 ENCOUNTER — Other Ambulatory Visit (INDEPENDENT_AMBULATORY_CARE_PROVIDER_SITE_OTHER): Payer: Medicare Other

## 2014-08-14 DIAGNOSIS — E785 Hyperlipidemia, unspecified: Secondary | ICD-10-CM | POA: Diagnosis not present

## 2014-08-14 DIAGNOSIS — Z0000 Encounter for general adult medical examination without abnormal findings: Secondary | ICD-10-CM

## 2014-08-14 LAB — CBC WITH DIFFERENTIAL/PLATELET
Basophils Absolute: 0 10*3/uL (ref 0.0–0.1)
Basophils Relative: 0.5 % (ref 0.0–3.0)
EOS ABS: 0.2 10*3/uL (ref 0.0–0.7)
Eosinophils Relative: 3.7 % (ref 0.0–5.0)
HEMATOCRIT: 43.4 % (ref 36.0–46.0)
HEMOGLOBIN: 14.1 g/dL (ref 12.0–15.0)
Lymphocytes Relative: 30.4 % (ref 12.0–46.0)
Lymphs Abs: 1.6 10*3/uL (ref 0.7–4.0)
MCHC: 32.4 g/dL (ref 30.0–36.0)
MCV: 89.7 fl (ref 78.0–100.0)
MONO ABS: 0.3 10*3/uL (ref 0.1–1.0)
Monocytes Relative: 6.5 % (ref 3.0–12.0)
NEUTROS ABS: 3.1 10*3/uL (ref 1.4–7.7)
Neutrophils Relative %: 58.9 % (ref 43.0–77.0)
Platelets: 296 10*3/uL (ref 150.0–400.0)
RBC: 4.84 Mil/uL (ref 3.87–5.11)
RDW: 14.4 % (ref 11.5–15.5)
WBC: 5.2 10*3/uL (ref 4.0–10.5)

## 2014-08-14 LAB — BASIC METABOLIC PANEL
BUN: 9 mg/dL (ref 6–23)
CO2: 23 meq/L (ref 19–32)
Calcium: 9.2 mg/dL (ref 8.4–10.5)
Chloride: 109 mEq/L (ref 96–112)
Creatinine, Ser: 0.8 mg/dL (ref 0.4–1.2)
GFR: 95.33 mL/min (ref >60.00)
GLUCOSE: 102 mg/dL — AB (ref 70–99)
POTASSIUM: 4.5 meq/L (ref 3.5–5.1)
Sodium: 142 mEq/L (ref 135–145)

## 2014-08-14 LAB — POCT URINALYSIS DIPSTICK
Bilirubin, UA: NEGATIVE
GLUCOSE UA: NEGATIVE
Ketones, UA: NEGATIVE
NITRITE UA: NEGATIVE
Protein, UA: NEGATIVE
Spec Grav, UA: 1.025
UROBILINOGEN UA: 0.2
pH, UA: 5.5

## 2014-08-14 LAB — LIPID PANEL
Cholesterol: 143 mg/dL (ref 0–200)
HDL: 53.5 mg/dL (ref >39.00)
LDL Cholesterol: 69 mg/dL (ref 0–99)
NONHDL: 89.5
Total CHOL/HDL Ratio: 3
Triglycerides: 102 mg/dL (ref 0.0–149.0)
VLDL: 20.4 mg/dL (ref 0.0–40.0)

## 2014-08-14 LAB — HEPATIC FUNCTION PANEL
ALBUMIN: 4.1 g/dL (ref 3.5–5.2)
ALT: 18 U/L (ref 0–35)
AST: 19 U/L (ref 0–37)
Alkaline Phosphatase: 93 U/L (ref 39–117)
Bilirubin, Direct: 0 mg/dL (ref 0.0–0.3)
Total Bilirubin: 0.7 mg/dL (ref 0.2–1.2)
Total Protein: 7.1 g/dL (ref 6.0–8.3)

## 2014-08-14 LAB — MICROALBUMIN / CREATININE URINE RATIO
CREATININE, U: 163.5 mg/dL
Microalb Creat Ratio: 0.8 mg/g (ref 0.0–30.0)
Microalb, Ur: 1.3 mg/dL (ref 0.0–1.9)

## 2014-08-14 LAB — TSH: TSH: 1.79 u[IU]/mL (ref 0.35–4.50)

## 2014-08-14 LAB — HEMOGLOBIN A1C: Hgb A1c MFr Bld: 7.3 % — ABNORMAL HIGH (ref 4.6–6.5)

## 2014-08-17 ENCOUNTER — Other Ambulatory Visit: Payer: BC Managed Care – HMO

## 2014-08-22 ENCOUNTER — Encounter: Payer: BC Managed Care – HMO | Admitting: Internal Medicine

## 2014-08-27 ENCOUNTER — Encounter: Payer: Self-pay | Admitting: Internal Medicine

## 2014-08-27 ENCOUNTER — Ambulatory Visit (INDEPENDENT_AMBULATORY_CARE_PROVIDER_SITE_OTHER): Payer: Medicare Other | Admitting: Internal Medicine

## 2014-08-27 VITALS — BP 130/82 | HR 79 | Temp 98.2°F | Ht 66.0 in | Wt 234.0 lb

## 2014-08-27 DIAGNOSIS — E785 Hyperlipidemia, unspecified: Secondary | ICD-10-CM

## 2014-08-27 DIAGNOSIS — L84 Corns and callosities: Secondary | ICD-10-CM

## 2014-08-27 DIAGNOSIS — I10 Essential (primary) hypertension: Secondary | ICD-10-CM

## 2014-08-27 DIAGNOSIS — E1165 Type 2 diabetes mellitus with hyperglycemia: Secondary | ICD-10-CM | POA: Diagnosis not present

## 2014-08-27 DIAGNOSIS — K219 Gastro-esophageal reflux disease without esophagitis: Secondary | ICD-10-CM | POA: Diagnosis not present

## 2014-08-27 DIAGNOSIS — IMO0002 Reserved for concepts with insufficient information to code with codable children: Secondary | ICD-10-CM

## 2014-08-27 MED ORDER — ATORVASTATIN CALCIUM 40 MG PO TABS: 20.0000 mg | ORAL_TABLET | Freq: Every day | ORAL | Status: DC

## 2014-08-27 MED ORDER — PANTOPRAZOLE SODIUM 40 MG PO TBEC: 40.0000 mg | DELAYED_RELEASE_TABLET | Freq: Every day | ORAL | Status: DC

## 2014-08-27 MED ORDER — LISINOPRIL 20 MG PO TABS: 20.0000 mg | ORAL_TABLET | Freq: Every day | ORAL | Status: DC

## 2014-08-27 MED ORDER — METFORMIN HCL ER 750 MG PO TB24: 750.0000 mg | ORAL_TABLET | Freq: Two times a day (BID) | ORAL | Status: DC

## 2014-08-27 MED ORDER — AMLODIPINE BESYLATE 10 MG PO TABS: 10.0000 mg | ORAL_TABLET | Freq: Every day | ORAL | Status: DC

## 2014-08-27 NOTE — Assessment & Plan Note (Addendum)
Diabetes control fairly stable.  Continue metformin and lifestyle changes.  Refer for diabetic eye exam.  Patient declines influenza vaccine.  Diabetic foot exam shows callus of right foot.  Also she has firm nodules on the bottom of right foot.  Refer to podiatry for further evaluation.  Rx for diabetic shoes provided. Lab Results  Component Value Date   HGBA1C 7.3* 08/14/2014   HGBA1C 7.1* 03/06/2014   HGBA1C 7.4* 11/22/2013   Lab Results  Component Value Date   MICROALBUR 1.3 08/14/2014   LDLCALC 69 08/14/2014   CREATININE 0.8 08/14/2014

## 2014-08-27 NOTE — Patient Instructions (Addendum)
Please complete the following lab tests before your next follow up appointment: BMET, A1c - 250.02 Schedule next office visit as Medicare Wellness Exam

## 2014-08-27 NOTE — Assessment & Plan Note (Signed)
Continue same dose of atorvastatin.  LDL at goal. Lab Results  Component Value Date   CHOL 143 08/14/2014   HDL 53.50 08/14/2014   LDLCALC 69 08/14/2014   LDLDIRECT 63.3 11/22/2013   TRIG 102.0 08/14/2014   CHOLHDL 3 08/14/2014

## 2014-08-27 NOTE — Assessment & Plan Note (Signed)
BP at goal.  No change in medication regimen. BP: 130/82 mmHg  Lab Results  Component Value Date   NA 142 08/14/2014   K 4.5 08/14/2014   CL 109 08/14/2014   CO2 23 08/14/2014   Lab Results  Component Value Date   CREATININE 0.8 08/14/2014

## 2014-08-27 NOTE — Progress Notes (Signed)
Subjective:    Patient ID: Terri [name], female    DOB: 1951-01-01, 63 y.o.   MRN: [id number]  HPI  63 year old African American female with type 2 diabetes uncontrolled, hypertension, hyperlipidemia and nonobstructive coronary artery disease for routine follow-up. Patient denies significant interval medical history. She reports her blood sugars are fairly stable. No significant weight change. She is tolerating her medications without difficulty.  Hypertension-stable  Hyperlipidemia - Good compliance with atorvastatin. No side effects reported   Review of Systems Negative for chest pain, negative for shortness of breath She denies changes in vision.     Past Medical History  Diagnosis Date  . Abnormal coagulation profile   . Other postoperative infection   . Abdominal mass   . Hypertension   . Hyperlipidemia   . GERD (gastroesophageal reflux disease)   . CAD (coronary artery disease)   . Diabetes mellitus type II, uncontrolled   . Mesenteric mass 07/2008    complicated by nausea & vomiting  . S/P PICC central line placement 10/2008    line sepsis-right PICC line removed     History   Social History  . Marital Status: Married    Spouse Name: N/A    Number of Children: N/A  . Years of Education: N/A   Occupational History  . Not on file.   Social History Main Topics  . Smoking status: Never Smoker   . Smokeless tobacco: Not on file  . Alcohol Use: Not on file  . Drug Use: Not on file  . Sexual Activity: Not on file   Other Topics Concern  . 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 Total abdominal hysterectomy  1993  . Abdominal mass resection  07/2008    Mesentertic Mass resection    Family History  Problem Relation Age of Onset  . Coronary artery disease Mother   .  Hypertension Mother   . Brain cancer Father [id number]    deceased  . Diabetes Sister   . Diabetes Brother      X 2    No Known Allergies  Current Outpatient Prescriptions on File Prior to Visit  Medication Sig Dispense Refill  . aspirin 81 MG tablet Take 81 mg by mouth daily.    Marland Kitchen. glucose blood (ONE TOUCH TEST STRIPS) test strip Use as instructed to check blood sugar three times daily 100 each 1  . Lysine HCl 500 MG TABS Take 1 tablet (500 mg total) by mouth daily. 90 tablet 1   No current facility-administered medications on file prior to visit.    BP 130/82 mmHg  Pulse 79  Temp(Src) 98.2 F (36.8 C) (Oral)  Ht 5\' 6"  (1.676 m)  Wt 234 lb (106.142 kg)  BMI 37.79 kg/m2      Objective:   Physical Exam  Constitutional: She is oriented to person, place, and time. She appears well-developed and well-nourished. No distress.  HENT:  Head: Normocephalic and atraumatic.  Right Ear: External ear normal.  Left Ear: External ear normal.  Eyes: Pupils are equal, round, and reactive to light.  Neck: Neck supple.  Cardiovascular: Normal rate, regular rhythm and normal heart sounds.  Pulmonary/Chest: Effort normal and breath sounds normal.  Normal breast exam bilaterally  Abdominal: Soft. Bowel sounds are normal. There is no tenderness.  Musculoskeletal: She exhibits no edema.  Lymphadenopathy:    She has no cervical adenopathy.  Neurological: She is alert and oriented to person, place, and time. No cranial nerve deficit.  Skin: Skin is warm and dry.  Psychiatric: She has a normal mood and affect. Her behavior is normal.        Assessment & Plan:

## 2014-08-27 NOTE — Progress Notes (Signed)
Pre visit review using our clinic review tool, if applicable. No additional management support is needed unless otherwise documented below in the visit note. 

## 2014-09-19 DIAGNOSIS — E119 Type 2 diabetes mellitus without complications: Secondary | ICD-10-CM | POA: Diagnosis not present

## 2014-09-19 DIAGNOSIS — H2513 Age-related nuclear cataract, bilateral: Secondary | ICD-10-CM | POA: Diagnosis not present

## 2014-09-25 ENCOUNTER — Ambulatory Visit (INDEPENDENT_AMBULATORY_CARE_PROVIDER_SITE_OTHER): Payer: Medicare Other

## 2014-09-25 DIAGNOSIS — M71579 Other bursitis, not elsewhere classified, unspecified ankle and foot: Secondary | ICD-10-CM

## 2014-09-25 DIAGNOSIS — R52 Pain, unspecified: Secondary | ICD-10-CM | POA: Diagnosis not present

## 2014-09-25 DIAGNOSIS — M2040 Other hammer toe(s) (acquired), unspecified foot: Secondary | ICD-10-CM | POA: Diagnosis not present

## 2014-09-25 DIAGNOSIS — M7750 Other enthesopathy of unspecified foot: Secondary | ICD-10-CM

## 2014-09-25 DIAGNOSIS — M722 Plantar fascial fibromatosis: Secondary | ICD-10-CM | POA: Diagnosis not present

## 2014-09-25 DIAGNOSIS — E1140 Type 2 diabetes mellitus with diabetic neuropathy, unspecified: Secondary | ICD-10-CM | POA: Diagnosis not present

## 2014-09-25 NOTE — Progress Notes (Signed)
   Subjective:    Patient ID: Terri [name], female    DOB: Feb 23, 1951, 63 y.o.   MRN: [id number]  HPI  PT STATED B/L UNDER FEET HAVE A KNOT AND GET A LITTLE SORE FOR 7 YEARS. FEET ARE GETTING WORSE AND GETTING LARGER. THE FEET GET AGGRAVATED BY STRETCHING THE FOOT. TRIED TO WEAR GOOD SUPPORT SHOES.  Review of Systems  All other systems reviewed and are negative.      Objective:   Physical Exam 63 year old F connecting female well-developed well-nourished oriented 3 presents at this time for couple reasons #1 has multiple nodules medial band plantar fascia bilateral 3 nodules on the right to nodules on the left medial fascia band also has likely bursitis fifth MTP area bilateral tailor bunion deformity and bursitis of the fifth metatarsal areas. Patient does have mild digital contractures diabetes with neuropathy abnormal sensations noted. The intact pedal pulses DP +2 PT plus one over 4 Refill time 3 seconds all digits epicritic and proprioceptive sensations intact although diminished on Semmes Weinstein to the foot and arch there is normal plantar response DTRs not listed x-rays reveal normal fracture normal osseous structure and no soft tissue abnormalities noted clinically soft tissue plantar fibromas of the medial band plantar fascia bilateral tender with certain activities and certain shoes. Is treatment other than self-care. Patient is also requesting diabetic shoes has authorization and a prescription for Dr. Artist PaisYoo for diabetic accident shoes and insoles. We'll get authorization and follow up with patient for diabetic shoe measurement and fitting when ready.      Assessment & Plan:  Assessment diabetes with complications we'll follow-up with diabetic shoes and accommodation in the future as needed this time patient also has plantar fibromatosis soft tissue tumors cysts benign lesions bilateral plantar fascial medial bands. At this time discussed options for treatment including  conservative massage therapy exercises topicals Aspercreme etc. also steroid injections and possibly surgical intervention patient Gayleen OremCates is not painful at all times wish to avoid any invasive options at this time we'll consider topical creams or ligaments reappointed on an as-needed basis for future follow-up  Alvan Dameichard Victoire Deans DPM

## 2014-09-25 NOTE — Patient Instructions (Addendum)
Diabetes and Foot Care Diabetes may cause you to have problems because of poor blood supply (circulation) to your feet and legs. This may cause the skin on your feet to become thinner, break easier, and heal more slowly. Your skin may become dry, and the skin may peel and crack. You may also have nerve damage in your legs and feet causing decreased feeling in them. You may not notice minor injuries to your feet that could lead to infections or more serious problems. Taking care of your feet is one of the most important things you can do for yourself.  HOME CARE INSTRUCTIONS  Wear shoes at all times, even in the house. Do not go barefoot. Bare feet are easily injured.  Check your feet daily for blisters, cuts, and redness. If you cannot see the bottom of your feet, use a mirror or ask someone for help.  Wash your feet with warm water (do not use hot water) and mild soap. Then pat your feet and the areas between your toes until they are completely dry. Do not soak your feet as this can dry your skin.  Apply a moisturizing lotion or petroleum jelly (that does not contain alcohol and is unscented) to the skin on your feet and to dry, brittle toenails. Do not apply lotion between your toes.  Trim your toenails straight across. Do not dig under them or around the cuticle. File the edges of your nails with an emery board or nail file.  Do not cut corns or calluses or try to remove them with medicine.  Wear clean socks or stockings every day. Make sure they are not too tight. Do not wear knee-high stockings since they may decrease blood flow to your legs.  Wear shoes that fit properly and have enough cushioning. To break in new shoes, wear them for just a few hours a day. This prevents you from injuring your feet. Always look in your shoes before you put them on to be sure there are no objects inside.  Do not cross your legs. This may decrease the blood flow to your feet.  If you find a minor scrape,  cut, or break in the skin on your feet, keep it and the skin around it clean and dry. These areas may be cleansed with mild soap and water. Do not cleanse the area with peroxide, alcohol, or iodine.  When you remove an adhesive bandage, be sure not to damage the skin around it.  If you have a wound, look at it several times a day to make sure it is healing.  Do not use heating pads or hot water bottles. They may burn your skin. If you have lost feeling in your feet or legs, you may not know it is happening until it is too late.  Make sure your health care provider performs a complete foot exam at least annually or more often if you have foot problems. Report any cuts, sores, or bruises to your health care provider immediately. SEEK MEDICAL CARE IF:   You have an injury that is not healing.  You have cuts or breaks in the skin.  You have an ingrown nail.  You notice redness on your legs or feet.  You feel burning or tingling in your legs or feet.  You have pain or cramps in your legs and feet.  Your legs or feet are numb.  Your feet always feel cold. SEEK IMMEDIATE MEDICAL CARE IF:   There is increasing redness,   swelling, or pain in or around a wound.  There is a red line that goes up your leg.  Pus is coming from a wound.  You develop a fever or as directed by your health care provider.  You notice a bad smell coming from an ulcer or wound. Document Released: 09/11/2000 Document Revised: 05/17/2013 Document Reviewed: 02/21/2013 [facility] Patient Information 2015 [place], [place]. This information is not intended to replace advice given to you by your health care provider. Make sure you discuss any questions you have with your health care provider.   Plantar fibroma is noted on both feet are basically scar tissue benign lesions. Might benefit from topical anti-inflammatory medicines such as Aspercreme. Also would benefit from shoes and diabetic insoles in the future.

## 2014-11-19 ENCOUNTER — Other Ambulatory Visit (INDEPENDENT_AMBULATORY_CARE_PROVIDER_SITE_OTHER): Payer: Medicare Other

## 2014-11-19 DIAGNOSIS — I10 Essential (primary) hypertension: Secondary | ICD-10-CM | POA: Diagnosis not present

## 2014-11-19 DIAGNOSIS — E1165 Type 2 diabetes mellitus with hyperglycemia: Secondary | ICD-10-CM | POA: Diagnosis not present

## 2014-11-19 DIAGNOSIS — IMO0002 Reserved for concepts with insufficient information to code with codable children: Secondary | ICD-10-CM

## 2014-11-19 LAB — BASIC METABOLIC PANEL
BUN: 11 mg/dL (ref 6–23)
CO2: 31 mEq/L (ref 19–32)
CREATININE: 0.75 mg/dL (ref 0.40–1.20)
Calcium: 9.4 mg/dL (ref 8.4–10.5)
Chloride: 104 mEq/L (ref 96–112)
GFR: 99.66 mL/min (ref >60.00)
Glucose, Bld: 131 mg/dL — ABNORMAL HIGH (ref 70–99)
POTASSIUM: 4.3 meq/L (ref 3.5–5.1)
Sodium: 141 mEq/L (ref 135–145)

## 2014-11-19 LAB — HEMOGLOBIN A1C: Hgb A1c MFr Bld: 8 % — ABNORMAL HIGH (ref 4.6–6.5)

## 2014-11-26 ENCOUNTER — Encounter: Payer: Self-pay | Admitting: Internal Medicine

## 2014-11-26 ENCOUNTER — Ambulatory Visit (INDEPENDENT_AMBULATORY_CARE_PROVIDER_SITE_OTHER): Payer: Medicare Other | Admitting: Internal Medicine

## 2014-11-26 VITALS — BP 130/82 | HR 80 | Temp 98.1°F | Ht 65.25 in | Wt 239.0 lb

## 2014-11-26 DIAGNOSIS — IMO0002 Reserved for concepts with insufficient information to code with codable children: Secondary | ICD-10-CM

## 2014-11-26 DIAGNOSIS — R51 Headache: Secondary | ICD-10-CM | POA: Diagnosis not present

## 2014-11-26 DIAGNOSIS — E1165 Type 2 diabetes mellitus with hyperglycemia: Secondary | ICD-10-CM

## 2014-11-26 DIAGNOSIS — Z0000 Encounter for general adult medical examination without abnormal findings: Secondary | ICD-10-CM | POA: Diagnosis not present

## 2014-11-26 DIAGNOSIS — Z23 Encounter for immunization: Secondary | ICD-10-CM

## 2014-11-26 DIAGNOSIS — R519 Headache, unspecified: Secondary | ICD-10-CM

## 2014-11-26 LAB — C-REACTIVE PROTEIN: CRP: 0.5 mg/dL (ref 0.5–20.0)

## 2014-11-26 LAB — SEDIMENTATION RATE: Sed Rate: 10 mm/hr (ref 0–22)

## 2014-11-26 MED ORDER — GLUCOSE BLOOD VI STRP: 1.0000 | ORAL_STRIP | Freq: Three times a day (TID) | Status: DC | PRN

## 2014-11-26 NOTE — Patient Instructions (Signed)
Please complete the following lab tests before your next follow up appointment:  BMET, A1c - 250.02 

## 2014-11-26 NOTE — Progress Notes (Signed)
Pre visit review using our clinic review tool, if applicable. No additional management support is needed unless otherwise documented below in the visit note. 

## 2014-11-26 NOTE — Progress Notes (Signed)
Subjective:    Patient ID: Terri [name], female    DOB: [birth year], 64 y.o.   MRN: [id number]  HPI  64 year old [place] American female with history of hypertension, hyperlipidemia coronary artery disease and type 2 diabetes uncontrolled presents for routine Medicare wellness exam.  Medicare Wellness questionnaire reviewed with patient. See attached sheet for details.  Type II diabetes - dietary compliance is fair.  A1c trending higher.  She also complains of chronic left-sided headache. Headache not associated with food or phobia or nausea. She describes a soreness left side of her head. No scalp tenderness. No changes in vision. She was referred to headache specialist in the past but did not proceed with consultation due to excessive cost.   Review of Systems  Constitutional: Negative for activity change, appetite change and unexpected weight change.  Eyes: Negative for visual disturbance.  Respiratory: Negative for cough, chest tightness and shortness of breath.   Cardiovascular: Negative for chest pain.  Genitourinary: Negative for difficulty urinating.  Neurological: Positive for headaches.  Gastrointestinal: Negative for abdominal pain, heartburn melena or hematochezia Psych: Negative for depression or anxiety Endo:  No polyuria or polydypsia        Past Medical History  Diagnosis Date  . Abnormal coagulation profile   . Other postoperative infection   . Abdominal mass   . Hypertension   . Hyperlipidemia   . GERD (gastroesophageal reflux disease)   . CAD (coronary artery disease)   . Diabetes mellitus type II, uncontrolled   . Mesenteric mass 07/2008    complicated by nausea & vomiting  . S/P PICC central line placement 10/2008    line sepsis-right PICC line removed     History   Social History  . Marital Status: Married    Spouse Name: N/A  . Number of Children: N/A  . Years of Education: N/A   Occupational History  . Not on file.   Social History  Main Topics  . Smoking status: Never Smoker   . Smokeless tobacco: Not on file  . Alcohol Use: Not on file  . Drug Use: Not on file  . Sexual Activity: Not on file   Other Topics Concern  . 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 Total abdominal hysterectomy  1993  . Abdominal mass resection  07/2008    Mesentertic Mass resection    Family History  Problem Relation Age of Onset  . Coronary artery disease Mother   . Hypertension Mother   . Brain cancer Father 8    deceased  . Diabetes Sister   . Diabetes Brother      X 2    Allergies  Allergen Reactions  . Metoclopramide     Other reaction(s): Unknown  . Morphine     Other reaction(s): Unknown    Current Outpatient Prescriptions on File Prior to Visit  Medication Sig Dispense Refill  . amLODipine (NORVASC) 10 MG tablet Take 1 tablet (10 mg total) by mouth daily. 90 tablet 1  . aspirin 81 MG tablet Take 81 mg by mouth daily.    Marland Kitchen atorvastatin (LIPITOR) 40 MG tablet Take 0.5 tablets (20 mg total) by  mouth daily. 90 tablet 1  . lisinopril (PRINIVIL,ZESTRIL) 20 MG tablet Take 1 tablet (20 mg total) by mouth daily. 90 tablet 1  . Lysine HCl 500 MG TABS Take 1 tablet (500 mg total) by mouth daily. 90 tablet 1  . metFORMIN (GLUCOPHAGE-XR) 750 MG 24 hr tablet Take 1 tablet (750 mg total) by mouth 2 (two) times daily with a meal. 180 tablet 1  . pantoprazole (PROTONIX) 40 MG tablet Take 1 tablet (40 mg total) by mouth daily. 90 tablet 1   No current facility-administered medications on file prior to visit.    BP 130/82 mmHg  Pulse 80  Temp(Src) 98.1 F (36.7 C) (Oral)  Ht 5' 5.25" (1.657 m)  Wt 239 lb (108.41 kg)  BMI 39.48 kg/m2    Objective:   Physical Exam  Constitutional: She is oriented to person, place, and time. She appears  well-developed and well-nourished. No distress.  HENT:  Head: Normocephalic and atraumatic.  Right Ear: External ear normal.  Left Ear: External ear normal.  Mouth/Throat: Oropharynx is clear and moist.  Hearing is grossly normal bilaterally  Eyes: Conjunctivae and EOM are normal. Pupils are equal, round, and reactive to light.  No defects in peripheral vision  Neck: Neck supple.  No carotid bruit  Cardiovascular: Normal rate, regular rhythm and normal heart sounds.   No murmur heard. Pulmonary/Chest: Effort normal and breath sounds normal. She has no wheezes.  Abdominal: Soft. Bowel sounds are normal. There is no tenderness.  Musculoskeletal: She exhibits no edema.  Lymphadenopathy:    She has no cervical adenopathy.  Neurological: She is alert and oriented to person, place, and time. No cranial nerve deficit.  Skin: Skin is warm and dry.  Psychiatric: She has a normal mood and affect. Her behavior is normal.          Assessment & Plan:

## 2014-11-26 NOTE — Assessment & Plan Note (Signed)
Reviewed adult health maintenance protocols.  Medicare wellness questionnaire reviewed in detail. She has not had any hospitalizations or visits to the emergency room within the past year. Her last eye exam completed in 09/11/2014 by Dr. Dione BoozeGroat. She does not have glaucoma. She wears corrective lenses. Her diabetic eye exam negative for diabetic retinopathy. She has not had any surgeries within the last year. Screening for substance abuse negative. She exercises on a regular basis.  She denies any difficulty with activities of daily living. She is not afraid of falling energy is stable. Screening for hearing loss, depression and memory loss negative. Patient updated with Prevnar and tetanus.

## 2014-11-26 NOTE — Assessment & Plan Note (Addendum)
64 year old [place] female complains of chronic left-sided headache. She describes fullness sensation. Her symptoms not suggestive of migraines. Check CT of head to rule out chronic sinusitis. Check sedimentation rate and CRP.  We discussed [facility].

## 2014-11-26 NOTE — Assessment & Plan Note (Signed)
A1c trending higher.  Patient declines additional oral agents.  Patient agrees to make additional dietary / lifestyle changes.  Lab Results  Component Value Date   HGBA1C 8.0* 11/19/2014   HGBA1C 7.3* 08/14/2014   HGBA1C 7.1* 03/06/2014   Lab Results  Component Value Date   MICROALBUR 1.3 08/14/2014   LDLCALC 69 08/14/2014   CREATININE 0.75 11/19/2014

## 2014-11-27 ENCOUNTER — Telehealth: Payer: Self-pay | Admitting: Internal Medicine

## 2014-11-27 ENCOUNTER — Inpatient Hospital Stay: Admission: RE | Admit: 2014-11-27 | Payer: Medicare Other | Source: Ambulatory Visit

## 2014-11-27 ENCOUNTER — Ambulatory Visit (INDEPENDENT_AMBULATORY_CARE_PROVIDER_SITE_OTHER): Payer: Medicare Other

## 2014-11-27 DIAGNOSIS — E1140 Type 2 diabetes mellitus with diabetic neuropathy, unspecified: Secondary | ICD-10-CM

## 2014-11-27 DIAGNOSIS — M71579 Other bursitis, not elsewhere classified, unspecified ankle and foot: Secondary | ICD-10-CM

## 2014-11-27 DIAGNOSIS — R52 Pain, unspecified: Secondary | ICD-10-CM

## 2014-11-27 DIAGNOSIS — M2040 Other hammer toe(s) (acquired), unspecified foot: Secondary | ICD-10-CM

## 2014-11-27 DIAGNOSIS — M7750 Other enthesopathy of unspecified foot: Secondary | ICD-10-CM

## 2014-11-27 DIAGNOSIS — M722 Plantar fascial fibromatosis: Secondary | ICD-10-CM

## 2014-11-27 MED ORDER — GLUCOSE BLOOD VI STRP: 1.0000 | ORAL_STRIP | Freq: Three times a day (TID) | Status: DC | PRN

## 2014-11-27 NOTE — Telephone Encounter (Signed)
Needs an order for test strips sent to [facility] please for a 90 ds

## 2014-11-27 NOTE — Progress Notes (Signed)
   Subjective:    Patient ID: Terri [name], female    DOB: [birth year], 64 y.o.   MRN: [id number]  HPI  diabetic shoe measurement.  Review of Systems no new changes or findings noted     Objective:   Physical Exam Neurovascular status diminished pedal pulses palpable DP +2 PT 1 over 4 decreased epicritic sensation and proprioceptive sensations on Semmes Weinstein the foot and arch. There is normal no signs of fracture or other osseous abnormalities are noted semirigid digital contractures noted patient does have plantar fibromatosis medial band plantar fascia bilateral with prominence and pain tenderness in the fascia fibromatous. Fit from appropriate diabetic shoes and custom molded insoles to accommodate the deformities was provide stability the foot prevent diabetes foot complications in the future.       Assessment & Plan:  Assessment diabetes with complications peripheral neuropathy and angiopathy and will deformity no formal pharmacy fibromatosis. As well as digital contractures hammertoe deformities [place] for shoes is carried out at this time we'll follow-up she is ready for fitting and dispensing in the near future  [employer] [place] Emoni Yang DPM

## 2014-11-27 NOTE — Telephone Encounter (Signed)
rx sent in electronically 

## 2014-11-27 NOTE — Patient Instructions (Signed)

## 2014-11-28 ENCOUNTER — Ambulatory Visit (INDEPENDENT_AMBULATORY_CARE_PROVIDER_SITE_OTHER)
Admission: RE | Admit: 2014-11-28 | Discharge: 2014-11-28 | Disposition: A | Discharge status: Home / Self Care | Payer: Medicare Other | Source: Ambulatory Visit | Attending: Internal Medicine | Admitting: Internal Medicine

## 2014-11-28 DIAGNOSIS — S0990XA Unspecified injury of head, initial encounter: Secondary | ICD-10-CM | POA: Diagnosis not present

## 2014-11-28 DIAGNOSIS — R51 Headache: Secondary | ICD-10-CM

## 2014-11-30 ENCOUNTER — Other Ambulatory Visit: Payer: Self-pay | Admitting: Internal Medicine

## 2014-11-30 DIAGNOSIS — R519 Headache, unspecified: Secondary | ICD-10-CM

## 2014-11-30 DIAGNOSIS — R51 Headache: Principal | ICD-10-CM

## 2014-12-14 ENCOUNTER — Ambulatory Visit (INDEPENDENT_AMBULATORY_CARE_PROVIDER_SITE_OTHER): Payer: Medicare Other | Admitting: Neurology

## 2014-12-14 ENCOUNTER — Encounter: Payer: Self-pay | Admitting: Neurology

## 2014-12-14 VITALS — BP 110/60 | HR 92 | Ht 67.0 in | Wt 239.0 lb

## 2014-12-14 DIAGNOSIS — G44019 Episodic cluster headache, not intractable: Secondary | ICD-10-CM | POA: Diagnosis not present

## 2014-12-14 MED ORDER — NORTRIPTYLINE HCL 10 MG PO CAPS: ORAL_CAPSULE | ORAL | Status: DC

## 2014-12-14 NOTE — Patient Instructions (Signed)
1. Start nortriptyline 10mg : Take 1 capsule at bedtime 2. Follow-up in 3 months, call our office for any problems

## 2014-12-14 NOTE — Progress Notes (Signed)
NEUROLOGY CONSULTATION NOTE  Terri [name] MRN: [id number] DOB: 03/20/1951  Referring provider: Dr. Ethelene Hal Primary care provider: Dr. Ethelene Hal  Reason for consult:  headaches  Dear Dr Artist Pais:  Thank you for your kind referral of Terri [name] for consultation of the above symptoms. Although her history is well known to you, please allow me to reiterate it for the purpose of our medical record. She is accompanied by her husband who helps supplement the history. Records and images were personally reviewed where available.  HISTORY OF PRESENT ILLNESS: This is a pleasant 64 year old right-handed woman with a history of hypertension, hyperlipidemia, diabetes, presenting for evaluation of recurrent headaches. She reports that headaches started in her 30s, occurring intermittently always affecting the left side of her head from the left retroorbital region, radiating down the back of her neck. She would have pressure-like pain lasting several days for 2 weeks straight. The left side of her head feels full. She reports that both eyes get teary, no conjunctival injection, no other autonomic signs. She has had two episodes this year so far, last episode was the end of February. No clear triggers identified. There is no associated nausea, vomiting, photo/phonophobia. She has had an electrical sound in both ears, unrelated to the headaches. Excedrin usually helps. No vision changes or visual obscurations. She denies any focal numbness/tingling/weakness, no back pain, bowel/bladder dysfunction. Her father had headaches. She denies any history of head injury. She reports sleep is "horrible," she only gets 4 hours of sleep, less around the time of the headaches. She occasionally takes Benadryl.  I personally reviewed head CT without contrast done 11/28/14 which was normal.  PAST MEDICAL HISTORY: Past Medical History  Diagnosis Date  . Abnormal coagulation profile   . Other postoperative  infection   . Abdominal mass   . Hypertension   . Hyperlipidemia   . GERD (gastroesophageal reflux disease)   . CAD (coronary artery disease)   . Diabetes mellitus type II, uncontrolled   . Mesenteric mass 07/2008    complicated by nausea & vomiting  . S/P PICC central line placement 10/2008    line sepsis-right PICC line removed     PAST SURGICAL HISTORY: Past Surgical History  Procedure Laterality Date  . Total abdominal hysterectomy  1993  . Abdominal mass resection  07/2008    Mesentertic Mass resection    MEDICATIONS: Current Outpatient Prescriptions on File Prior to Visit  Medication Sig Dispense Refill  . amLODipine (NORVASC) 10 MG tablet Take 1 tablet (10 mg total) by mouth daily. 90 tablet 1  . aspirin 81 MG tablet Take 81 mg by mouth daily.    Marland Kitchen atorvastatin (LIPITOR) 40 MG tablet Take 0.5 tablets (20 mg total) by mouth daily. 90 tablet 1  . lisinopril (PRINIVIL,ZESTRIL) 20 MG tablet Take 1 tablet (20 mg total) by mouth daily. 90 tablet 1  . Lysine HCl 500 MG TABS Take 1 tablet (500 mg total) by mouth daily. 90 tablet 1  . metFORMIN (GLUCOPHAGE-XR) 750 MG 24 hr tablet Take 1 tablet (750 mg total) by mouth 2 (two) times daily with a meal. 180 tablet 1  . pantoprazole (PROTONIX) 40 MG tablet Take 1 tablet (40 mg total) by mouth daily. 90 tablet 1  . glucose blood test strip 1 each by Other route 3 (three) times daily as needed for other. Use as instructed (Patient not taking: Reported on 12/14/2014) 100 each 3   No current facility-administered medications on  file prior to visit.    ALLERGIES: Allergies  Allergen Reactions  . Metoclopramide     Other reaction(s): Unknown  . Morphine     Other reaction(s): Unknown    FAMILY HISTORY: Family History  Problem Relation Age of Onset  . Coronary artery disease Mother   . Hypertension Mother   . Brain cancer Father 68    deceased  . Diabetes Sister   . Diabetes Brother      X 2    SOCIAL HISTORY: History    Social History  . Marital Status: Married    Spouse Name: N/A  . Number of Children: N/A  . Years of Education: N/A   Occupational History  . Not on file.   Social History Main Topics  . Smoking status: Never Smoker   . Smokeless tobacco: Not on file  . Alcohol Use: No  . Drug Use: No  . Sexual Activity: Not on file   Other Topics Concern  . 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      Never Smoked      Alcohol use-no                REVIEW OF SYSTEMS: Constitutional: No fevers, chills, or sweats, no generalized fatigue, change in appetite Eyes: No visual changes, double vision, eye pain Ear, nose and throat: No hearing loss, ear pain, nasal congestion, sore throat Cardiovascular: No chest pain, palpitations Respiratory:  No shortness of breath at rest or with exertion, wheezes GastrointestinaI: No nausea, vomiting, diarrhea, abdominal pain, fecal incontinence Genitourinary:  No dysuria, urinary retention or frequency Musculoskeletal:  No neck pain, back pain Integumentary: No rash, pruritus, skin lesions Neurological: as above Psychiatric: No depression, insomnia, anxiety Endocrine: No palpitations, fatigue, diaphoresis, mood swings, change in appetite, change in weight, increased thirst Hematologic/Lymphatic:  No anemia, purpura, petechiae. Allergic/Immunologic: no itchy/runny eyes, nasal congestion, recent allergic reactions, rashes  PHYSICAL EXAM: Filed Vitals:   12/14/14 1250  BP: 110/60  Pulse: 92   General: No acute distress Head:  Normocephalic/atraumatic Eyes: Fundoscopic exam shows bilateral sharp discs, no vessel changes, exudates, or hemorrhages Neck: supple, no paraspinal tenderness, full range of motion Back: No paraspinal tenderness Heart: regular rate and rhythm Lungs: Clear to auscultation bilaterally. Vascular: No carotid bruits. Skin/Extremities: No rash, no  edema Neurological Exam: Mental status: alert and oriented to person, place, and time, no dysarthria or aphasia, Fund of knowledge is appropriate.  Recent and remote memory are intact.  Attention and concentration are normal.    Able to name objects and repeat phrases. Cranial nerves: CN I: not tested CN II: pupils equal, round and reactive to light, visual fields intact, fundi unremarkable. CN III, IV, VI:  full range of motion, no nystagmus, no ptosis CN V: facial sensation intact CN VII: upper and lower face symmetric CN VIII: hearing intact to finger rub CN IX, X: gag intact, uvula midline CN XI: sternocleidomastoid and trapezius muscles intact CN XII: tongue midline Bulk & Tone: normal, no fasciculations. Motor: 5/5 throughout with no pronator drift. Sensation: intact to light touch, cold, pin, vibration and joint position sense.  No extinction to double simultaneous stimulation.  Romberg test negative Deep Tendon Reflexes: +2 throughout, no ankle clonus Plantar responses: downgoing bilaterally Cerebellar: no incoordination on finger to nose, heel to shin. No dysdiadochokinesia Gait: narrow-based and steady, able to tandem walk  adequately. Tremor: none  IMPRESSION: This is a pleasant 64 year old right-handed woman with a history of hypertension, hyperlipidemia, diabetes, presenting with recurrent headaches always localized over the left side, lasting for up to 2 weeks at a time, suggestive of cluster headaches. Her neurological exam and head CT are normal. We discussed starting headache preventative medication to help reduce the frequency and intensity of these clusters, options were discussed, she is already on 2 BP medications, however Verapamil may be considered in the future if there is no response to nortriptyline. This may help with sleep as well. She will start low dose, we may uptitrate depending on response. Side effects were discussed. She will keep a diary of her headaches and  follow-up in 3 months.   Thank you for allowing me to participate in the care of this patient. Please do not hesitate to call for any questions or concerns.   Patrcia DollyKaren Aquino, M.D.  CC: Dr. Artist PaisYoo

## 2014-12-19 ENCOUNTER — Encounter: Payer: Self-pay | Admitting: Neurology

## 2014-12-19 DIAGNOSIS — G44019 Episodic cluster headache, not intractable: Secondary | ICD-10-CM | POA: Insufficient documentation

## 2015-02-11 ENCOUNTER — Other Ambulatory Visit (INDEPENDENT_AMBULATORY_CARE_PROVIDER_SITE_OTHER): Payer: Medicare Other

## 2015-02-11 DIAGNOSIS — E1165 Type 2 diabetes mellitus with hyperglycemia: Secondary | ICD-10-CM | POA: Diagnosis not present

## 2015-02-11 DIAGNOSIS — I10 Essential (primary) hypertension: Secondary | ICD-10-CM

## 2015-02-11 DIAGNOSIS — E119 Type 2 diabetes mellitus without complications: Secondary | ICD-10-CM

## 2015-02-11 LAB — BASIC METABOLIC PANEL
BUN: 12 mg/dL (ref 6–23)
CHLORIDE: 104 meq/L (ref 96–112)
CO2: 31 mEq/L (ref 19–32)
Calcium: 9.3 mg/dL (ref 8.4–10.5)
Creatinine, Ser: 0.73 mg/dL (ref 0.40–1.20)
GFR: 102.74 mL/min (ref >60.00)
Glucose, Bld: 142 mg/dL — ABNORMAL HIGH (ref 70–99)
Potassium: 3.8 mEq/L (ref 3.5–5.1)
Sodium: 140 mEq/L (ref 135–145)

## 2015-02-11 LAB — HEMOGLOBIN A1C: HEMOGLOBIN A1C: 7.6 % — AB (ref 4.6–6.5)

## 2015-02-13 ENCOUNTER — Ambulatory Visit (INDEPENDENT_AMBULATORY_CARE_PROVIDER_SITE_OTHER): Payer: Medicare Other | Admitting: Podiatry

## 2015-02-13 DIAGNOSIS — E1140 Type 2 diabetes mellitus with diabetic neuropathy, unspecified: Secondary | ICD-10-CM

## 2015-02-13 NOTE — Patient Instructions (Signed)

## 2015-02-22 ENCOUNTER — Ambulatory Visit: Payer: Medicare Other | Admitting: Internal Medicine

## 2015-03-18 ENCOUNTER — Ambulatory Visit: Payer: Medicare Other | Admitting: Neurology

## 2015-04-12 ENCOUNTER — Encounter: Payer: Self-pay | Admitting: Internal Medicine

## 2015-04-12 ENCOUNTER — Ambulatory Visit (INDEPENDENT_AMBULATORY_CARE_PROVIDER_SITE_OTHER): Payer: Medicare Other | Admitting: Internal Medicine

## 2015-04-12 VITALS — BP 110/80 | HR 88 | Temp 98.2°F | Wt 238.0 lb

## 2015-04-12 DIAGNOSIS — I10 Essential (primary) hypertension: Secondary | ICD-10-CM

## 2015-04-12 DIAGNOSIS — K219 Gastro-esophageal reflux disease without esophagitis: Secondary | ICD-10-CM

## 2015-04-12 DIAGNOSIS — E1165 Type 2 diabetes mellitus with hyperglycemia: Secondary | ICD-10-CM | POA: Diagnosis not present

## 2015-04-12 DIAGNOSIS — E785 Hyperlipidemia, unspecified: Secondary | ICD-10-CM | POA: Diagnosis not present

## 2015-04-12 DIAGNOSIS — IMO0002 Reserved for concepts with insufficient information to code with codable children: Secondary | ICD-10-CM

## 2015-04-12 DIAGNOSIS — G44019 Episodic cluster headache, not intractable: Secondary | ICD-10-CM | POA: Diagnosis not present

## 2015-04-12 DIAGNOSIS — M25551 Pain in right hip: Secondary | ICD-10-CM

## 2015-04-12 MED ORDER — METFORMIN HCL ER 750 MG PO TB24: 750.0000 mg | ORAL_TABLET | Freq: Two times a day (BID) | ORAL | Status: DC

## 2015-04-12 MED ORDER — LISINOPRIL 20 MG PO TABS: 20.0000 mg | ORAL_TABLET | Freq: Every day | ORAL | Status: DC

## 2015-04-12 MED ORDER — PANTOPRAZOLE SODIUM 40 MG PO TBEC: 40.0000 mg | DELAYED_RELEASE_TABLET | Freq: Every day | ORAL | Status: DC

## 2015-04-12 MED ORDER — AMLODIPINE BESYLATE 10 MG PO TABS: 10.0000 mg | ORAL_TABLET | Freq: Every day | ORAL | Status: DC

## 2015-04-12 MED ORDER — ATORVASTATIN CALCIUM 40 MG PO TABS: 20.0000 mg | ORAL_TABLET | Freq: Every day | ORAL | Status: DC

## 2015-04-12 NOTE — Progress Notes (Signed)
Pre visit review using our clinic review tool, if applicable. No additional management support is needed unless otherwise documented below in the visit note. 

## 2015-04-12 NOTE — Patient Instructions (Signed)
Please complete the following lab tests before your next follow up appointment: BMET, A1c, microalbumin / Cr ratio - 250.02 FLP, LFTs, TSH - 272.4

## 2015-04-12 NOTE — Assessment & Plan Note (Signed)
Well controlled.  No change in medication. BP: 110/80 mmHg

## 2015-04-12 NOTE — Assessment & Plan Note (Signed)
Patient advised to start nortriptyline if she experiences more than 2 moderate - severe headaches per month.  We discussed common side effects of TCAs.

## 2015-04-12 NOTE — Progress Notes (Signed)
Subjective:    Patient ID: Terri [name], female    DOB: [birth year], 64 y.o.   MRN: [id number]  HPI  64 year old African-American female for follow-up regarding chronic headaches, type 2 diabetes and hypertension. Interval medical history-patient seen by neurology for chronic headaches.  Patient was prescribed nortriptyline in mid March 2016. Patient reports never starting medication due to improvement in headache frequency and severity as well as concern for side effects. Patient has not had severe headache for 3-4 months. She has mild headache 1-2 times per month that she self treats with Excedrin Migraine.  Type 2 diabetes-patient reports her home blood pressure readings are in the low 100s.  Hypertension-stable  Patient complains of intermittent right hip pain. Symptoms worse with laying on her right side.   Review of Systems Negative for chest pain or shortness of breath    Past Medical History  Diagnosis Date  . Abnormal coagulation profile   . Other postoperative infection   . Abdominal mass   . Hypertension   . Hyperlipidemia   . GERD (gastroesophageal reflux disease)   . CAD (coronary artery disease)   . Diabetes mellitus type II, uncontrolled   . Mesenteric mass 07/2008    complicated by nausea & vomiting  . S/P PICC central line placement 10/2008    line sepsis-right PICC line removed     History   Social History  . Marital Status: Married    Spouse Name: N/A  . Number of Children: N/A  . Years of Education: N/A   Occupational History  . Not on file.   Social History Main Topics  . Smoking status: Never Smoker   . Smokeless tobacco: Not on file  . Alcohol Use: No  . Drug Use: No  . Sexual Activity: Not on file   Other Topics Concern  . 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      Never Smoked      Alcohol use-no                Past Surgical History  Procedure  Laterality Date  . Total abdominal hysterectomy  1993  . Abdominal mass resection  07/2008    Mesentertic Mass resection    Family History  Problem Relation Age of Onset  . Coronary artery disease Mother   . Hypertension Mother   . Brain cancer Father 66    deceased  . Diabetes Sister   . Diabetes Brother      X 2    Allergies  Allergen Reactions  . Metoclopramide     Other reaction(s): Unknown  . Morphine     Other reaction(s): Unknown    Current Outpatient Prescriptions on File Prior to Visit  Medication Sig Dispense Refill  . amLODipine (NORVASC) 10 MG tablet Take 1 tablet (10 mg total) by mouth daily. 90 tablet 1  . aspirin 81 MG tablet Take 81 mg by mouth daily.    Marland Kitchen atorvastatin (LIPITOR) 40 MG tablet Take 0.5 tablets (20 mg total) by mouth daily. 90 tablet 1  . glucose blood test strip 1 each by Other route 3 (three) times daily as needed for other. Use as instructed 100 each 3  . lisinopril (PRINIVIL,ZESTRIL) 20 MG tablet Take 1 tablet (20 mg total) by mouth daily. 90 tablet 1  . Lysine HCl 500 MG TABS Take 1 tablet (500 mg  total) by mouth daily. 90 tablet 1  . metFORMIN (GLUCOPHAGE-XR) 750 MG 24 hr tablet Take 1 tablet (750 mg total) by mouth 2 (two) times daily with a meal. 180 tablet 1  . pantoprazole (PROTONIX) 40 MG tablet Take 1 tablet (40 mg total) by mouth daily. 90 tablet 1   No current facility-administered medications on file prior to visit.    BP 110/80 mmHg  Pulse 88  Temp(Src) 98.2 F (36.8 C) (Oral)  Wt 238 lb (107.956 kg)  Lab Results  Component Value Date   HGBA1C 7.6* 02/11/2015   HGBA1C 8.0* 11/19/2014   HGBA1C 7.3* 08/14/2014   Lab Results  Component Value Date   MICROALBUR 1.3 08/14/2014   LDLCALC 69 08/14/2014   CREATININE 0.73 02/11/2015   Wt Readings from Last 3 Encounters:  04/12/15 238 lb (107.956 kg)  12/14/14 239 lb (108.41 kg)  11/26/14 239 lb (108.41 kg)     Objective:   Physical Exam  Constitutional: She is  oriented to person, place, and time. She appears well-developed and well-nourished. No distress.  HENT:  Head: Normocephalic and atraumatic.  Cardiovascular: Normal rate, regular rhythm and normal heart sounds.   No murmur heard. Pulmonary/Chest: Effort normal and breath sounds normal. She has no wheezes.  Musculoskeletal: She exhibits no edema.  No pain with internal or external rotation of right hip. Mild tenderness of right trochanteric bursa  Neurological: She is alert and oriented to person, place, and time. No cranial nerve deficit.  Skin: Skin is warm and dry.  Psychiatric: She has a normal mood and affect. Her behavior is normal.          Assessment & Plan:

## 2015-04-12 NOTE — Assessment & Plan Note (Signed)
Her A1c is trending lower. Continue same dose of metformin. She would like to defer additional oral agents for diabetes. Continue healthy diet and regular exercise. Repeat A1c in 3 months.

## 2015-04-12 NOTE — Assessment & Plan Note (Signed)
Patient complains of intermittent right hip pain for the last several weeks. Her symptoms consistent with possible hip bursitis. She declines cortisone injection for now. Patient advised to use cool compress and over-the-counter ibuprofen as directed.

## 2015-07-04 ENCOUNTER — Other Ambulatory Visit: Payer: Medicare Other

## 2015-07-09 ENCOUNTER — Other Ambulatory Visit: Payer: Medicare Other

## 2015-07-09 ENCOUNTER — Telehealth: Payer: Self-pay | Admitting: Internal Medicine

## 2015-07-09 NOTE — Telephone Encounter (Signed)
Good morning Dr Ermalene Searing. My name is Olegario Messier and I work at [employer]. Our provider, Dr Artist Pais has been ourt on medical leave for some time now. We are not sure at this point when he will return. This pt, would like to know if you would mind accepting her as a pt? She lives in [place] and it is a short drive to your office.  She has been with Dr Artist Pais over 10 yrs, and would not consider this change if we were sure he will be back.  Pls advise if ok

## 2015-07-09 NOTE — Telephone Encounter (Signed)
I would be happy to.  Terri [name] will you set her up a 30 min new appt in next few weeks with fasting lab labs prior ( looks like she is due for 3 month DM follow up).  Please call pt and let her know.

## 2015-07-09 NOTE — Telephone Encounter (Signed)
I spoke to patient and she scheduled labs on 07/12/15 and new patient appointment on 07/19/15.

## 2015-07-12 ENCOUNTER — Other Ambulatory Visit (INDEPENDENT_AMBULATORY_CARE_PROVIDER_SITE_OTHER): Payer: Medicare Other

## 2015-07-12 ENCOUNTER — Ambulatory Visit: Payer: Medicare Other | Admitting: Internal Medicine

## 2015-07-12 ENCOUNTER — Telehealth: Payer: Self-pay | Admitting: Family Medicine

## 2015-07-12 DIAGNOSIS — E1165 Type 2 diabetes mellitus with hyperglycemia: Secondary | ICD-10-CM | POA: Diagnosis not present

## 2015-07-12 DIAGNOSIS — IMO0001 Reserved for inherently not codable concepts without codable children: Secondary | ICD-10-CM

## 2015-07-12 LAB — COMPREHENSIVE METABOLIC PANEL
ALBUMIN: 4.1 g/dL (ref 3.5–5.2)
ALT: 16 U/L (ref 0–35)
AST: 17 U/L (ref 0–37)
Alkaline Phosphatase: 95 U/L (ref 39–117)
BUN: 8 mg/dL (ref 6–23)
CHLORIDE: 103 meq/L (ref 96–112)
CO2: 31 mEq/L (ref 19–32)
CREATININE: 0.79 mg/dL (ref 0.40–1.20)
Calcium: 9.3 mg/dL (ref 8.4–10.5)
GFR: 93.67 mL/min (ref >60.00)
Glucose, Bld: 160 mg/dL — ABNORMAL HIGH (ref 70–99)
Potassium: 3.7 mEq/L (ref 3.5–5.1)
SODIUM: 142 meq/L (ref 135–145)
TOTAL PROTEIN: 7.2 g/dL (ref 6.0–8.3)
Total Bilirubin: 0.5 mg/dL (ref 0.2–1.2)

## 2015-07-12 LAB — HEMOGLOBIN A1C: HEMOGLOBIN A1C: 7.2 % — AB (ref 4.6–6.5)

## 2015-07-12 LAB — LIPID PANEL
Cholesterol: 150 mg/dL (ref 0–200)
HDL: 53.9 mg/dL (ref >39.00)
LDL Cholesterol: 73 mg/dL (ref 0–99)
NONHDL: 96.47
Total CHOL/HDL Ratio: 3
Triglycerides: 115 mg/dL (ref 0.0–149.0)
VLDL: 23 mg/dL (ref 0.0–40.0)

## 2015-07-12 NOTE — Telephone Encounter (Signed)
-----   Message from Alvina Chouerri J Walsh sent at 07/09/2015  3:24 PM EDT ----- Regarding: Lab orders for Friday, 10.14.16 Patient is scheduled for CPX labs, please order future labs, Thanks , Camelia Engerri

## 2015-07-19 ENCOUNTER — Encounter: Payer: Self-pay | Admitting: Family Medicine

## 2015-07-19 ENCOUNTER — Ambulatory Visit (INDEPENDENT_AMBULATORY_CARE_PROVIDER_SITE_OTHER): Payer: Medicare Other | Admitting: Family Medicine

## 2015-07-19 VITALS — BP 128/76 | HR 74 | Temp 97.7°F | Ht 66.25 in | Wt 233.8 lb

## 2015-07-19 DIAGNOSIS — E785 Hyperlipidemia, unspecified: Secondary | ICD-10-CM

## 2015-07-19 DIAGNOSIS — E1165 Type 2 diabetes mellitus with hyperglycemia: Secondary | ICD-10-CM

## 2015-07-19 DIAGNOSIS — K219 Gastro-esophageal reflux disease without esophagitis: Secondary | ICD-10-CM

## 2015-07-19 DIAGNOSIS — I10 Essential (primary) hypertension: Secondary | ICD-10-CM

## 2015-07-19 DIAGNOSIS — G44019 Episodic cluster headache, not intractable: Secondary | ICD-10-CM

## 2015-07-19 DIAGNOSIS — IMO0001 Reserved for inherently not codable concepts without codable children: Secondary | ICD-10-CM

## 2015-07-19 LAB — HM DIABETES FOOT EXAM

## 2015-07-19 MED ORDER — SCOPOLAMINE 1 MG/3DAYS TD PT72: 1.0000 | MEDICATED_PATCH | TRANSDERMAL | Status: AC

## 2015-07-19 MED ORDER — AMLODIPINE BESYLATE 10 MG PO TABS: 10.0000 mg | ORAL_TABLET | Freq: Every day | ORAL | Status: DC

## 2015-07-19 MED ORDER — METFORMIN HCL ER 750 MG PO TB24: 750.0000 mg | ORAL_TABLET | Freq: Two times a day (BID) | ORAL | Status: DC

## 2015-07-19 MED ORDER — LISINOPRIL 20 MG PO TABS: 20.0000 mg | ORAL_TABLET | Freq: Every day | ORAL | Status: DC

## 2015-07-19 MED ORDER — PANTOPRAZOLE SODIUM 40 MG PO TBEC: 40.0000 mg | DELAYED_RELEASE_TABLET | Freq: Every day | ORAL | Status: DC

## 2015-07-19 MED ORDER — NORTRIPTYLINE HCL 10 MG PO CAPS: 10.0000 mg | ORAL_CAPSULE | Freq: Every day | ORAL | Status: DC

## 2015-07-19 MED ORDER — ATORVASTATIN CALCIUM 40 MG PO TABS: 20.0000 mg | ORAL_TABLET | Freq: Every day | ORAL | Status: DC

## 2015-07-19 NOTE — Assessment & Plan Note (Signed)
Well controlled on pantoprazole.

## 2015-07-19 NOTE — Patient Instructions (Addendum)
Make sure to get yearly eye exam.  Work ing on low carb diet, start walking daily.

## 2015-07-19 NOTE — Progress Notes (Signed)
Pre visit review using our clinic review tool, if applicable. No additional management support is needed unless otherwise documented below in the visit note. 

## 2015-07-19 NOTE — Assessment & Plan Note (Signed)
Improved on nortriptiline.

## 2015-07-19 NOTE — Assessment & Plan Note (Signed)
Well controlled. Continue current medication.  

## 2015-07-19 NOTE — Progress Notes (Signed)
Subjective:    Patient ID: Terri [name], female    DOB: 28-Jan-1951, 64 y.o.   MRN: [id number]  HPI  64 year old female presents to establish care.  Previous MD Dr. Artist Pais.  Last saw him in  03/2015 for  DM, HLD follow up.  CPX last in spring with  Dr. Artist Pais.  Hypertension:    Controlled on amlodipine 10, lisinopril 20 BP Readings from Last 3 Encounters:  07/19/15 128/76  04/12/15 110/80  12/14/14 110/60  Using medication without problems or lightheadedness:  None Chest pain with exertion:none Edema:None Short of breath:None Average home BPs: not checking BP Other issues:  Diabetes:   Almost at goal  (trending down in last 8 months, 8 to 7.6 to 7.2) with recent labs on metformin 750 two times daily. Lab Results  Component Value Date   HGBA1C 7.2* 07/12/2015  Using medications without difficulties: Hypoglycemic episodes:none Hyperglycemic episodes:none Feet problems: Blood Sugars averaging: fbs 124-135 eye exam within last year:  Elevated Cholesterol:  LDL  almostat goal  < 70 (hx of CAD) on lipitor 40 Lab Results  Component Value Date   CHOL 150 07/12/2015   HDL 53.90 07/12/2015   LDLCALC 73 07/12/2015   LDLDIRECT 63.3 11/22/2013   TRIG 115.0 07/12/2015   CHOLHDL 3 07/12/2015  Using medications without problems:None Muscle aches: None Diet compliance: moderate Exercise: none Other complaints:   CAD, MI in 2008 medically managed: On ASA 81 mg daily.  GERD: well controlled on pantoprazole   Going on a cruise  Used scopolamine patches  Review of Systems  Constitutional: Negative for fever and fatigue.  HENT: Negative for ear pain.   Eyes: Negative for pain.  Respiratory: Negative for chest tightness and shortness of breath.   Cardiovascular: Negative for chest pain, palpitations and leg swelling.  Gastrointestinal: Negative for abdominal pain.  Genitourinary: Negative for dysuria.       Objective:   Physical Exam  Constitutional: Vital signs are  normal. She appears well-developed and well-nourished. She is cooperative.  Non-toxic appearance. She does not appear ill. No distress.  HENT:  Head: Normocephalic.  Right Ear: Hearing, tympanic membrane, external ear and ear canal normal. Tympanic membrane is not erythematous, not retracted and not bulging.  Left Ear: Hearing, tympanic membrane, external ear and ear canal normal. Tympanic membrane is not erythematous, not retracted and not bulging.  Nose: No mucosal edema or rhinorrhea. Right sinus exhibits no maxillary sinus tenderness and no frontal sinus tenderness. Left sinus exhibits no maxillary sinus tenderness and no frontal sinus tenderness.  Mouth/Throat: Uvula is midline, oropharynx is clear and moist and mucous membranes are normal.  Eyes: Conjunctivae, EOM and lids are normal. Pupils are equal, round, and reactive to light. Lids are everted and swept, no foreign bodies found.  Neck: Trachea normal and normal range of motion. Neck supple. Carotid bruit is not present. No thyroid mass and no thyromegaly present.  Cardiovascular: Normal rate, regular rhythm, S1 normal, S2 normal, normal heart sounds, intact distal pulses and normal pulses.  Exam reveals no gallop and no friction rub.   No murmur heard. Pulmonary/Chest: Effort normal and breath sounds normal. No tachypnea. No respiratory distress. She has no decreased breath sounds. She has no wheezes. She has no rhonchi. She has no rales.  Abdominal: Soft. Normal appearance and bowel sounds are normal. There is no tenderness.  Neurological: She is alert.  Skin: Skin is warm, dry and intact. No rash noted.  Psychiatric: Her speech is  normal and behavior is normal. Judgment and thought content normal. Her mood appears not anxious. Cognition and memory are normal. She does not exhibit a depressed mood.    Diabetic foot exam: Normal inspection except tumors on bilateral feet No skin breakdown No calluses  Normal DP pulses Normal  sensation to light touch and monofilament Nails normal       Assessment & Plan:

## 2015-07-19 NOTE — Assessment & Plan Note (Signed)
Improving. Encouraged exercise, weight loss, healthy eating habits.  Recheck in 3 months.

## 2015-08-20 ENCOUNTER — Encounter: Payer: Self-pay | Admitting: Family Medicine

## 2015-08-20 ENCOUNTER — Ambulatory Visit (INDEPENDENT_AMBULATORY_CARE_PROVIDER_SITE_OTHER): Payer: Medicare Other | Admitting: Family Medicine

## 2015-08-20 VITALS — BP 137/76 | HR 86 | Temp 98.6°F | Ht 66.25 in | Wt 231.8 lb

## 2015-08-20 DIAGNOSIS — L723 Sebaceous cyst: Secondary | ICD-10-CM | POA: Diagnosis not present

## 2015-08-20 DIAGNOSIS — J0100 Acute maxillary sinusitis, unspecified: Secondary | ICD-10-CM

## 2015-08-20 DIAGNOSIS — J019 Acute sinusitis, unspecified: Secondary | ICD-10-CM | POA: Insufficient documentation

## 2015-08-20 DIAGNOSIS — L089 Local infection of the skin and subcutaneous tissue, unspecified: Secondary | ICD-10-CM | POA: Insufficient documentation

## 2015-08-20 MED ORDER — DOXYCYCLINE HYCLATE 100 MG PO TABS: 100.0000 mg | ORAL_TABLET | Freq: Two times a day (BID) | ORAL | Status: DC

## 2015-08-20 NOTE — Progress Notes (Signed)
Pre visit review using our clinic review tool, if applicable. No additional management support is needed unless otherwise documented below in the visit note. 

## 2015-08-20 NOTE — Addendum Note (Signed)
Addended by: Damita LackLORING, Tilford Deaton S on: 08/20/2015 09:58 AM   Modules accepted: Orders

## 2015-08-20 NOTE — Patient Instructions (Addendum)
Complete course of doxycycline x 10 days.  Continue mucinex DM and start nasal saline. Remove packing in 24-48h,  Irrigate in shower with clean water and wash dailywith soap and water keep bandaged, follow up if concerns/spreading erythema/pain.

## 2015-08-20 NOTE — Assessment & Plan Note (Signed)
Continue mucineX DM,  Nasal saline and start antibitoics.

## 2015-08-20 NOTE — Assessment & Plan Note (Signed)
See procedure note.

## 2015-08-20 NOTE — Progress Notes (Signed)
Subjective:    Patient ID: Terri [name], female    DOB: 12/08/1950, 64 y.o.   MRN: [id number]  HPI  64 year old female with history of DM, HTN, hyperlipidemia presents with multiple complaints.  1.  Nasal congestion, headache x 6 days, pain in left  Face,left upper teeth, left eye  Pink, stuck together. No fever. Mild sore throat. No SOB, mild cough, no wheeze. She has used mucinex for her symptoms, Excedrin for headache helped some.  2.cyst on back is painful and itchy in last  Few weeks. Starting to come to a head. No discharge.   Cyst has been present for many years prior. Tried hot towels, ETOH, neosporin, topical steroid for itching. No relief.  Has not been checking CBGs closely. Social History /Family History/Past Medical History reviewed and updated if needed.   Review of Systems  Constitutional: Negative for fever and fatigue.  HENT: Negative for ear pain.   Eyes: Negative for pain.  Respiratory: Negative for chest tightness and shortness of breath.   Cardiovascular: Negative for chest pain, palpitations and leg swelling.  Gastrointestinal: Negative for abdominal pain.  Genitourinary: Negative for dysuria.       Objective:   Physical Exam  Constitutional: Vital signs are normal. She appears well-developed and well-nourished. She is cooperative.  Non-toxic appearance. She does not appear ill. No distress.  HENT:  Head: Normocephalic.  Right Ear: Hearing, tympanic membrane, external ear and ear canal normal. Tympanic membrane is not erythematous, not retracted and not bulging. No middle ear effusion.  Left Ear: Hearing, tympanic membrane, external ear and ear canal normal. Tympanic membrane is not erythematous, not retracted and not bulging.  No middle ear effusion.  Nose: Mucosal edema and rhinorrhea present. Right sinus exhibits no maxillary sinus tenderness and no frontal sinus tenderness. Left sinus exhibits maxillary sinus tenderness. Left sinus exhibits no  frontal sinus tenderness.  Mouth/Throat: Uvula is midline, oropharynx is clear and moist and mucous membranes are normal. No oropharyngeal exudate, posterior oropharyngeal edema or posterior oropharyngeal erythema.  Eyes: Conjunctivae, EOM and lids are normal. Pupils are equal, round, and reactive to light. Lids are everted and swept, no foreign bodies found.  Neck: Trachea normal and normal range of motion. Neck supple. Carotid bruit is not present. No thyroid mass and no thyromegaly present.  Cardiovascular: Normal rate, regular rhythm, S1 normal, S2 normal, normal heart sounds, intact distal pulses and normal pulses.  Exam reveals no gallop and no friction rub.   No murmur heard. Pulmonary/Chest: Effort normal and breath sounds normal. No tachypnea. No respiratory distress. She has no decreased breath sounds. She has no wheezes. She has no rhonchi. She has no rales.  Abdominal: Soft. Normal appearance and bowel sounds are normal. There is no tenderness.  Neurological: She is alert.  Skin: Skin is warm, dry and intact. No rash noted.   Erythema, swelling at cyst in upper central back  2 inch diameter.  Psychiatric: Her speech is normal and behavior is normal. Judgment and thought content normal. Her mood appears not anxious. Cognition and memory are normal. She does not exhibit a depressed mood.          Assessment & Plan:  I&D  Meds, vitals, and allergies reviewed.   Indication: suspect abscess  Pt complaints of: erythema, pain, swelling  Location: central upper back  Size: 2 inch diameter  Verbal informed consent obtained.  Pt aware of risks not limited to but including infection, bleeding, damage to near  by organs.  Prep: etoh/betadine  Anesthesia: 2%lidocaine with epi, good effect  Incision made with #11 blade  Would explored and loculations removed  Wound packed with iodoform gauze  Tolerated well  Routine postprocedure instructions d/w pt- remove packing in  24-48h, keep area clean and bandaged, follow up if concerns/spreading erythema/pain.

## 2015-08-23 LAB — WOUND CULTURE
Gram Stain: NONE SEEN
Gram Stain: NONE SEEN

## 2015-10-18 ENCOUNTER — Encounter: Payer: Self-pay | Admitting: Family Medicine

## 2015-10-18 ENCOUNTER — Ambulatory Visit (INDEPENDENT_AMBULATORY_CARE_PROVIDER_SITE_OTHER): Payer: Medicare Other | Admitting: Family Medicine

## 2015-10-18 VITALS — BP 110/62 | HR 86 | Temp 97.8°F | Ht 66.25 in | Wt 232.2 lb

## 2015-10-18 DIAGNOSIS — B372 Candidiasis of skin and nail: Secondary | ICD-10-CM

## 2015-10-18 MED ORDER — NYSTATIN 100000 UNIT/GM EX CREA: 1.0000 "application " | TOPICAL_CREAM | Freq: Two times a day (BID) | CUTANEOUS | Status: DC

## 2015-10-18 NOTE — Patient Instructions (Signed)
Apply cream to affected area twice daily for several weeks. Keep area dry as able.  Continue cream 2 days after rash resolved.  Intertrigo Intertrigo is a skin condition that occurs in between folds of skin in places on the body that rub together a lot and do not get much ventilation. It is caused by heat, moisture, friction, sweat retention, and lack of air circulation, which produces red, irritated patches and, sometimes, scaling or drainage. People who have diabetes, who are obese, or who have treatment with antibiotics are at increased risk for intertrigo. The most common sites for intertrigo to occur include:  The groin.  The breasts.  The armpits.  Folds of abdominal skin.  Webbed spaces between the fingers or toes. Intertrigo may be aggravated by:  Sweat.  Feces.  Yeast or bacteria that are present near skin folds.  Urine.  Vaginal discharge. HOME CARE INSTRUCTIONS  The following steps can be taken to reduce friction and keep the affected area cool and dry:  Expose skin folds to the air.  Keep deep skin folds separated with cotton or linen cloth. Avoid tight fitting clothing that could cause chafing.  Wear open-toed shoes or sandals to help reduce moisture between the toes.  Apply absorbent powders to affected areas as directed by your caregiver.  Apply over-the-counter barrier pastes, such as zinc oxide, as directed by your caregiver.  If you develop a fungal infection in the affected area, your caregiver may have you use antifungal creams. SEEK MEDICAL CARE IF:   The rash is not improving after 1 week of treatment.  The rash is getting worse (more red, more swollen, more painful, or spreading).  You have a fever or chills. MAKE SURE YOU:   Understand these instructions.  Will watch your condition.  Will get help right away if you are not doing well or get worse.   This information is not intended to replace advice given to you by your health care  provider. Make sure you discuss any questions you have with your health care provider.   Document Released: 09/14/2005 Document Revised: 12/07/2011 Document Reviewed: 03/18/2015 Elsevier Interactive Patient Education [employer]

## 2015-10-18 NOTE — Progress Notes (Signed)
Pre visit review using our clinic review tool, if applicable. No additional management support is needed unless otherwise documented below in the visit note. 

## 2015-10-18 NOTE — Progress Notes (Signed)
   Subjective:    Patient ID: Terri [name], female    DOB: [birth year], 65 y.o.   MRN: [id number]  HPI  65 year old female with previously inadequately controlled diabetes presents with rash under breasts.  Noted red rash under breasts and in groin creases. Had similar in past.. Improved with gold bond.  Presents 2 week. Very itchy, moist.   She has tried neosporin and petroleum jelly.  No fever. Feels well otherwise.   She complete antibiotic course 2 month ago.   Due for repeat A1C  In 10/2015. Lab Results  Component Value Date   HGBA1C 7.2* 07/12/2015   FBS lately 124. Nothing > 200 and < 60.    Review of Systems  Skin:       Small bumps oc on scalp, sounds like folliculitis.  Healed lesion on back at site of I and D... Cyst appears to be reforming.       Objective:   Physical Exam  Constitutional: Vital signs are normal. She appears well-developed and well-nourished. She is cooperative.  Non-toxic appearance. She does not appear ill. No distress.  HENT:  Head: Normocephalic.  Right Ear: Hearing, tympanic membrane, external ear and ear canal normal. Tympanic membrane is not erythematous, not retracted and not bulging.  Left Ear: Hearing, tympanic membrane, external ear and ear canal normal. Tympanic membrane is not erythematous, not retracted and not bulging.  Nose: No mucosal edema or rhinorrhea. Right sinus exhibits no maxillary sinus tenderness and no frontal sinus tenderness. Left sinus exhibits no maxillary sinus tenderness and no frontal sinus tenderness.  Mouth/Throat: Uvula is midline, oropharynx is clear and moist and mucous membranes are normal.  Eyes: Conjunctivae, EOM and lids are normal. Pupils are equal, round, and reactive to light. Lids are everted and swept, no foreign bodies found.  Neck: Trachea normal and normal range of motion. Neck supple. Carotid bruit is not present. No thyroid mass and no thyromegaly present.  Cardiovascular: Normal rate, regular  rhythm, S1 normal, S2 normal, normal heart sounds, intact distal pulses and normal pulses.  Exam reveals no gallop and no friction rub.   No murmur heard. Pulmonary/Chest: Effort normal and breath sounds normal. No tachypnea. No respiratory distress. She has no decreased breath sounds. She has no wheezes. She has no rhonchi. She has no rales.  Abdominal: Soft. Normal appearance and bowel sounds are normal. There is no tenderness.  Neurological: She is alert.  Skin: Skin is warm, dry and intact. No rash noted.  Purplish rash with excoriations in right groin and under breasts.  Healed insicion site on central back... Cyst reforming , but no erythema.  Psychiatric: Her speech is normal and behavior is normal. Judgment and thought content normal. Her mood appears not anxious. Cognition and memory are normal. She does not exhibit a depressed mood.          Assessment & Plan:

## 2015-10-18 NOTE — Assessment & Plan Note (Signed)
Treat with topical antifungal. Info on care given. Improving sugar control.

## 2015-11-19 ENCOUNTER — Telehealth: Payer: Self-pay | Admitting: Family Medicine

## 2015-11-19 ENCOUNTER — Other Ambulatory Visit (INDEPENDENT_AMBULATORY_CARE_PROVIDER_SITE_OTHER): Payer: Medicare Other

## 2015-11-19 ENCOUNTER — Other Ambulatory Visit: Payer: Self-pay | Admitting: Family Medicine

## 2015-11-19 DIAGNOSIS — Z1159 Encounter for screening for other viral diseases: Secondary | ICD-10-CM

## 2015-11-19 DIAGNOSIS — E1165 Type 2 diabetes mellitus with hyperglycemia: Secondary | ICD-10-CM | POA: Diagnosis not present

## 2015-11-19 DIAGNOSIS — IMO0001 Reserved for inherently not codable concepts without codable children: Secondary | ICD-10-CM

## 2015-11-19 DIAGNOSIS — E785 Hyperlipidemia, unspecified: Secondary | ICD-10-CM

## 2015-11-19 LAB — COMPREHENSIVE METABOLIC PANEL
ALT: 16 U/L (ref 0–35)
AST: 14 U/L (ref 0–37)
Albumin: 4 g/dL (ref 3.5–5.2)
Alkaline Phosphatase: 87 U/L (ref 39–117)
BUN: 8 mg/dL (ref 6–23)
CHLORIDE: 104 meq/L (ref 96–112)
CO2: 31 meq/L (ref 19–32)
CREATININE: 0.7 mg/dL (ref 0.40–1.20)
Calcium: 9.4 mg/dL (ref 8.4–10.5)
GFR: 107.58 mL/min (ref >60.00)
Glucose, Bld: 154 mg/dL — ABNORMAL HIGH (ref 70–99)
Potassium: 4.1 mEq/L (ref 3.5–5.1)
SODIUM: 140 meq/L (ref 135–145)
Total Bilirubin: 0.5 mg/dL (ref 0.2–1.2)
Total Protein: 6.9 g/dL (ref 6.0–8.3)

## 2015-11-19 LAB — LIPID PANEL
CHOLESTEROL: 136 mg/dL (ref 0–200)
HDL: 47.8 mg/dL (ref >39.00)
LDL CALC: 66 mg/dL (ref 0–99)
NONHDL: 87.95
Total CHOL/HDL Ratio: 3
Triglycerides: 110 mg/dL (ref 0.0–149.0)
VLDL: 22 mg/dL (ref 0.0–40.0)

## 2015-11-19 LAB — HEMOGLOBIN A1C: Hgb A1c MFr Bld: 7.8 % — ABNORMAL HIGH (ref 4.6–6.5)

## 2015-11-19 NOTE — Telephone Encounter (Signed)
-----   Message from Natasha C Chavers sent at 11/14/2015 10:48 AM EST ----- Regarding: Cpx labs Tues 2/21, need orders. Thanks! :) Please order  future cpx labs for pt's upcoming lab appt. Thanks Tasha  

## 2015-11-20 LAB — HEPATITIS C ANTIBODY: HCV Ab: NEGATIVE

## 2015-11-22 ENCOUNTER — Encounter: Payer: Self-pay | Admitting: Family Medicine

## 2015-11-22 ENCOUNTER — Ambulatory Visit (INDEPENDENT_AMBULATORY_CARE_PROVIDER_SITE_OTHER): Payer: Medicare Other | Admitting: Family Medicine

## 2015-11-22 VITALS — BP 110/70 | HR 86 | Temp 98.2°F | Ht 66.0 in | Wt 230.8 lb

## 2015-11-22 DIAGNOSIS — E1165 Type 2 diabetes mellitus with hyperglycemia: Secondary | ICD-10-CM

## 2015-11-22 DIAGNOSIS — E785 Hyperlipidemia, unspecified: Secondary | ICD-10-CM | POA: Diagnosis not present

## 2015-11-22 DIAGNOSIS — Z0000 Encounter for general adult medical examination without abnormal findings: Secondary | ICD-10-CM | POA: Diagnosis not present

## 2015-11-22 DIAGNOSIS — I10 Essential (primary) hypertension: Secondary | ICD-10-CM | POA: Diagnosis not present

## 2015-11-22 DIAGNOSIS — E2839 Other primary ovarian failure: Secondary | ICD-10-CM

## 2015-11-22 DIAGNOSIS — IMO0001 Reserved for inherently not codable concepts without codable children: Secondary | ICD-10-CM

## 2015-11-22 DIAGNOSIS — Z7189 Other specified counseling: Secondary | ICD-10-CM | POA: Insufficient documentation

## 2015-11-22 DIAGNOSIS — Z1231 Encounter for screening mammogram for malignant neoplasm of breast: Secondary | ICD-10-CM

## 2015-11-22 LAB — HM DIABETES FOOT EXAM

## 2015-11-22 NOTE — Progress Notes (Signed)
I have personally reviewed the Medicare Annual Wellness questionnaire and have noted 1. The patient's medical and social history 2. Their use of alcohol, tobacco or illicit drugs 3. Their current medications and supplements 4. The patient's functional ability including ADL's, fall risks, home safety risks and hearing or visual             impairment. 5. Diet and physical activities 6. Evidence for depression or mood disorders 7.         Updated provider list Cognitive evaluation was performed and recorded on pt medicare questionnaire form. The patients weight, height, BMI and visual acuity have been recorded in the chart  I have made referrals, counseling and provided education to the patient based review of the above and I have provided the pt with a written personalized care plan for preventive services.   Hypertension:  Controlled on amlodipine 10, lisinopril 20 BP Readings from Last 3 Encounters:  11/22/15 110/70  10/18/15 110/62  08/20/15 137/76  Using medication without problems or lightheadedness: None Chest pain with exertion:none Edema:None Short of breath:None Average home BPs: not checking BP Other issues:  Diabetes: Worsening control  on metformin 750 two times daily. Not sticking to low carb diet, has started walking. Lab Results  Component Value Date   HGBA1C 7.8* 11/19/2015  Using medications without difficulties: Hypoglycemic episodes:none Hyperglycemic episodes:none Feet problems: None Blood Sugars averaging: FBS 141-151 eye exam within last year: due  Elevated Cholesterol: LDL  at goal < 70 (hx of CAD) on lipitor 40 Lab Results  Component Value Date   CHOL 136 11/19/2015   HDL 47.80 11/19/2015   LDLCALC 66 11/19/2015   LDLDIRECT 63.3 11/22/2013   TRIG 110.0 11/19/2015   CHOLHDL 3 11/19/2015  Using medications without problems:None Muscle aches: None Diet compliance: poor Exercise:  walking Other complaints:   CAD, MI in 2008 medically  managed: On ASA 81 mg daily.  GERD: well controlled on pantoprazole    Wt Readings from Last 3 Encounters:  11/22/15 230 lb 12 oz (104.668 kg)  10/18/15 232 lb 4 oz (105.348 kg)  08/20/15 231 lb 12 oz (105.121 kg)     Review of Systems  Constitutional: Negative for fever and fatigue.  HENT: Negative for ear pain.  Eyes: Negative for pain.  Respiratory: Negative for chest tightness and shortness of breath.  Cardiovascular: Negative for chest pain, palpitations and leg swelling.  Gastrointestinal: Negative for abdominal pain.  Genitourinary: Negative for dysuria.       Objective:   Physical Exam  Constitutional: Vital signs are normal. She appears well-developed and well-nourished. She is cooperative. Non-toxic appearance. She does not appear ill. No distress.  HENT:  Head: Normocephalic.  Right Ear: Hearing, tympanic membrane, external ear and ear canal normal. Tympanic membrane is not erythematous, not retracted and not bulging.  Left Ear: Hearing, tympanic membrane, external ear and ear canal normal. Tympanic membrane is not erythematous, not retracted and not bulging.  Nose: No mucosal edema or rhinorrhea. Right sinus exhibits no maxillary sinus tenderness and no frontal sinus tenderness. Left sinus exhibits no maxillary sinus tenderness and no frontal sinus tenderness.  Mouth/Throat: Uvula is midline, oropharynx is clear and moist and mucous membranes are normal.  Eyes: Conjunctivae, EOM and lids are normal. Pupils are equal, round, and reactive to light. Lids are everted and swept, no foreign bodies found.  Neck: Trachea normal and normal range of motion. Neck supple. Carotid bruit is not present. No thyroid mass and no thyromegaly present.  Cardiovascular: Normal rate, regular rhythm, S1 normal, S2 normal, normal heart sounds, intact distal pulses and normal pulses. Exam reveals no gallop and no friction rub.  No murmur heard. Pulmonary/Chest: Effort normal and  breath sounds normal. No tachypnea. No respiratory distress. She has no decreased breath sounds. She has no wheezes. She has no rhonchi. She has no rales.  Abdominal: Soft. Normal appearance and bowel sounds are normal. There is no tenderness.  Neurological: She is alert.  Skin: Skin is warm, dry and intact. No rash noted.  Psychiatric: Her speech is normal and behavior is normal. Judgment and thought content normal. Her mood appears not anxious. Cognition and memory are normal. She does not exhibit a depressed mood.    Diabetic foot exam: Normal inspection except tumors on bilateral feet No skin breakdown No calluses  Normal DP pulses Normal sensation to light touch and monofilament Nails normal        ASSESSMENT AND PLAN:  The patient's preventative maintenance and recommended screening tests for an annual wellness exam were reviewed in full today. Brought up to date unless services declined.  Counselled on the importance of diet, exercise, and its role in overall health and mortality. The patient's FH and SH was reviewed, including their home life, tobacco status, and drug and alcohol status.   Vaccines: Refused flu and shingles. Colon: 2008,  nml per pt, Repeat due in 2018  Mammo: overdue  DVE/pap: no pap or DVE indicated , s/p TAH  Hep C: neg Nonsmoker  DEXA: due

## 2015-11-22 NOTE — Patient Instructions (Addendum)
Make sure to get your yearly eye exam.  Work on low carb diet and increase exercise and work.  Decrease candy.   Stop at front desk to set up mammogram and bone density.

## 2015-11-22 NOTE — Assessment & Plan Note (Signed)
HAs living will and HCPOA Reviewed 2017

## 2015-11-22 NOTE — Assessment & Plan Note (Signed)
Now at goal on lipitor.

## 2015-11-22 NOTE — Assessment & Plan Note (Signed)
Poor control, poor diet. Get back on track with lifestyle changes. If not at goal next OV increase metformin.

## 2015-11-22 NOTE — Assessment & Plan Note (Signed)
Well controlled. Continue current medication.  

## 2015-11-22 NOTE — Progress Notes (Signed)
Pre visit review using our clinic review tool, if applicable. No additional management support is needed unless otherwise documented below in the visit note. 

## 2015-12-12 ENCOUNTER — Ambulatory Visit: Payer: Medicare Other

## 2015-12-12 ENCOUNTER — Other Ambulatory Visit: Payer: Medicare Other

## 2015-12-23 ENCOUNTER — Ambulatory Visit
Admission: RE | Admit: 2015-12-23 | Discharge: 2015-12-23 | Disposition: A | Discharge status: Home / Self Care | Payer: Medicare Other | Source: Ambulatory Visit | Attending: Family Medicine | Admitting: Family Medicine

## 2015-12-23 DIAGNOSIS — Z1231 Encounter for screening mammogram for malignant neoplasm of breast: Secondary | ICD-10-CM | POA: Diagnosis not present

## 2015-12-23 DIAGNOSIS — Z13820 Encounter for screening for osteoporosis: Secondary | ICD-10-CM | POA: Diagnosis not present

## 2015-12-23 DIAGNOSIS — E2839 Other primary ovarian failure: Secondary | ICD-10-CM

## 2015-12-23 DIAGNOSIS — Z780 Asymptomatic menopausal state: Secondary | ICD-10-CM | POA: Diagnosis not present

## 2015-12-24 ENCOUNTER — Telehealth: Payer: Self-pay | Admitting: Family Medicine

## 2015-12-24 NOTE — Telephone Encounter (Signed)
Patient returned [name]'s call. °

## 2015-12-24 NOTE — Telephone Encounter (Signed)
See result notes on Bone Density Report and Mammogram Report.

## 2015-12-25 ENCOUNTER — Other Ambulatory Visit: Payer: Self-pay | Admitting: Family Medicine

## 2015-12-25 DIAGNOSIS — R928 Other abnormal and inconclusive findings on diagnostic imaging of breast: Secondary | ICD-10-CM

## 2016-01-02 ENCOUNTER — Ambulatory Visit
Admission: RE | Admit: 2016-01-02 | Discharge: 2016-01-02 | Disposition: A | Discharge status: Home / Self Care | Payer: Medicare Other | Source: Ambulatory Visit | Attending: Family Medicine | Admitting: Family Medicine

## 2016-01-02 ENCOUNTER — Other Ambulatory Visit: Payer: Self-pay | Admitting: Family Medicine

## 2016-01-02 DIAGNOSIS — R921 Mammographic calcification found on diagnostic imaging of breast: Secondary | ICD-10-CM | POA: Diagnosis not present

## 2016-01-02 DIAGNOSIS — R928 Other abnormal and inconclusive findings on diagnostic imaging of breast: Secondary | ICD-10-CM

## 2016-01-06 ENCOUNTER — Ambulatory Visit
Admission: RE | Admit: 2016-01-06 | Discharge: 2016-01-06 | Disposition: A | Discharge status: Home / Self Care | Payer: Medicare Other | Source: Ambulatory Visit | Attending: Family Medicine | Admitting: Family Medicine

## 2016-01-06 DIAGNOSIS — R921 Mammographic calcification found on diagnostic imaging of breast: Secondary | ICD-10-CM

## 2016-01-06 DIAGNOSIS — R920 Mammographic microcalcification found on diagnostic imaging of breast: Secondary | ICD-10-CM | POA: Diagnosis not present

## 2016-01-06 DIAGNOSIS — D0511 Intraductal carcinoma in situ of right breast: Secondary | ICD-10-CM | POA: Diagnosis not present

## 2016-01-07 ENCOUNTER — Telehealth: Payer: Self-pay

## 2016-01-07 DIAGNOSIS — D0511 Intraductal carcinoma in situ of right breast: Secondary | ICD-10-CM

## 2016-01-07 NOTE — Telephone Encounter (Signed)
Pt left /vm; pt was advised by breast center that pt may have breast cancer; pt request to speak with Dr Ermalene SearingBedsole about what should do next. Pt request to speak with Dr Ermalene SearingBedsole only. Last annual exam on 11/22/15.

## 2016-01-07 NOTE — Telephone Encounter (Signed)
Spoke with pt , answered questions in detail. [facility]

## 2016-01-09 NOTE — Telephone Encounter (Signed)
Appt made with [facility] on 01/14/16 with Breast Surgeon and Rad Oncologist, patient aware, notes and Pathology were faxed. [facility] will obtain the patients images and Pathology slides. [facility].

## 2016-01-13 DIAGNOSIS — D0511 Intraductal carcinoma in situ of right breast: Secondary | ICD-10-CM | POA: Insufficient documentation

## 2016-01-13 DIAGNOSIS — Z853 Personal history of malignant neoplasm of breast: Secondary | ICD-10-CM | POA: Insufficient documentation

## 2016-01-14 DIAGNOSIS — Z79899 Other long term (current) drug therapy: Secondary | ICD-10-CM | POA: Diagnosis not present

## 2016-01-14 DIAGNOSIS — R921 Mammographic calcification found on diagnostic imaging of breast: Secondary | ICD-10-CM | POA: Diagnosis not present

## 2016-01-14 DIAGNOSIS — Z170 Estrogen receptor positive status [ER+]: Secondary | ICD-10-CM | POA: Diagnosis not present

## 2016-01-14 DIAGNOSIS — D0511 Intraductal carcinoma in situ of right breast: Secondary | ICD-10-CM | POA: Diagnosis not present

## 2016-01-14 DIAGNOSIS — R920 Mammographic microcalcification found on diagnostic imaging of breast: Secondary | ICD-10-CM | POA: Diagnosis not present

## 2016-01-22 DIAGNOSIS — D0511 Intraductal carcinoma in situ of right breast: Secondary | ICD-10-CM | POA: Diagnosis not present

## 2016-01-27 HISTORY — PX: BREAST SURGERY: SHX581

## 2016-02-05 DIAGNOSIS — I2510 Atherosclerotic heart disease of native coronary artery without angina pectoris: Secondary | ICD-10-CM | POA: Diagnosis not present

## 2016-02-05 DIAGNOSIS — D0511 Intraductal carcinoma in situ of right breast: Secondary | ICD-10-CM | POA: Diagnosis not present

## 2016-02-05 DIAGNOSIS — K219 Gastro-esophageal reflux disease without esophagitis: Secondary | ICD-10-CM | POA: Diagnosis not present

## 2016-02-05 DIAGNOSIS — E1165 Type 2 diabetes mellitus with hyperglycemia: Secondary | ICD-10-CM | POA: Diagnosis not present

## 2016-02-05 DIAGNOSIS — I10 Essential (primary) hypertension: Secondary | ICD-10-CM | POA: Diagnosis not present

## 2016-02-13 DIAGNOSIS — I10 Essential (primary) hypertension: Secondary | ICD-10-CM | POA: Diagnosis not present

## 2016-02-13 DIAGNOSIS — D0511 Intraductal carcinoma in situ of right breast: Secondary | ICD-10-CM | POA: Diagnosis not present

## 2016-02-13 DIAGNOSIS — Z79899 Other long term (current) drug therapy: Secondary | ICD-10-CM | POA: Diagnosis not present

## 2016-02-13 DIAGNOSIS — E119 Type 2 diabetes mellitus without complications: Secondary | ICD-10-CM | POA: Diagnosis not present

## 2016-02-13 DIAGNOSIS — Z7982 Long term (current) use of aspirin: Secondary | ICD-10-CM | POA: Diagnosis not present

## 2016-02-13 DIAGNOSIS — Z7984 Long term (current) use of oral hypoglycemic drugs: Secondary | ICD-10-CM | POA: Diagnosis not present

## 2016-02-13 DIAGNOSIS — I2510 Atherosclerotic heart disease of native coronary artery without angina pectoris: Secondary | ICD-10-CM | POA: Diagnosis not present

## 2016-02-13 DIAGNOSIS — I252 Old myocardial infarction: Secondary | ICD-10-CM | POA: Diagnosis not present

## 2016-02-13 DIAGNOSIS — Z170 Estrogen receptor positive status [ER+]: Secondary | ICD-10-CM | POA: Diagnosis not present

## 2016-02-13 DIAGNOSIS — R921 Mammographic calcification found on diagnostic imaging of breast: Secondary | ICD-10-CM | POA: Diagnosis not present

## 2016-02-13 DIAGNOSIS — E785 Hyperlipidemia, unspecified: Secondary | ICD-10-CM | POA: Diagnosis not present

## 2016-02-13 DIAGNOSIS — K219 Gastro-esophageal reflux disease without esophagitis: Secondary | ICD-10-CM | POA: Diagnosis not present

## 2016-02-14 DIAGNOSIS — D0511 Intraductal carcinoma in situ of right breast: Secondary | ICD-10-CM | POA: Diagnosis not present

## 2016-02-14 DIAGNOSIS — Z7982 Long term (current) use of aspirin: Secondary | ICD-10-CM | POA: Diagnosis not present

## 2016-02-14 DIAGNOSIS — E119 Type 2 diabetes mellitus without complications: Secondary | ICD-10-CM | POA: Diagnosis not present

## 2016-02-14 DIAGNOSIS — Z7984 Long term (current) use of oral hypoglycemic drugs: Secondary | ICD-10-CM | POA: Diagnosis not present

## 2016-02-14 DIAGNOSIS — Z79899 Other long term (current) drug therapy: Secondary | ICD-10-CM | POA: Diagnosis not present

## 2016-02-14 DIAGNOSIS — Z170 Estrogen receptor positive status [ER+]: Secondary | ICD-10-CM | POA: Diagnosis not present

## 2016-02-18 ENCOUNTER — Other Ambulatory Visit: Payer: Medicare Other

## 2016-02-18 ENCOUNTER — Telehealth: Payer: Self-pay | Admitting: Family Medicine

## 2016-02-18 DIAGNOSIS — E1165 Type 2 diabetes mellitus with hyperglycemia: Principal | ICD-10-CM

## 2016-02-18 DIAGNOSIS — IMO0001 Reserved for inherently not codable concepts without codable children: Secondary | ICD-10-CM

## 2016-02-18 NOTE — Telephone Encounter (Signed)
-----   Message from Baldomero LamyNatasha C Chavers sent at 02/11/2016  1:40 PM EDT ----- Regarding: Dm f/u labs Tues 5/23, need orders. Thanks! :-) Please order future dm f/u labs for pt's upcoming lab appt. Thanks Rodney Boozeasha

## 2016-02-25 ENCOUNTER — Ambulatory Visit: Payer: Medicare Other | Admitting: Family Medicine

## 2016-02-25 DIAGNOSIS — R920 Mammographic microcalcification found on diagnostic imaging of breast: Secondary | ICD-10-CM | POA: Diagnosis not present

## 2016-02-25 DIAGNOSIS — Z170 Estrogen receptor positive status [ER+]: Secondary | ICD-10-CM | POA: Diagnosis not present

## 2016-02-25 DIAGNOSIS — D0511 Intraductal carcinoma in situ of right breast: Secondary | ICD-10-CM | POA: Diagnosis not present

## 2016-02-25 DIAGNOSIS — Z79899 Other long term (current) drug therapy: Secondary | ICD-10-CM | POA: Diagnosis not present

## 2016-02-25 DIAGNOSIS — N63 Unspecified lump in breast: Secondary | ICD-10-CM | POA: Diagnosis not present

## 2016-02-25 DIAGNOSIS — R921 Mammographic calcification found on diagnostic imaging of breast: Secondary | ICD-10-CM | POA: Diagnosis not present

## 2016-02-25 DIAGNOSIS — Z9889 Other specified postprocedural states: Secondary | ICD-10-CM | POA: Diagnosis not present

## 2016-02-25 DIAGNOSIS — Z79810 Long term (current) use of selective estrogen receptor modulators (SERMs): Secondary | ICD-10-CM | POA: Diagnosis not present

## 2016-03-10 DIAGNOSIS — D0511 Intraductal carcinoma in situ of right breast: Secondary | ICD-10-CM | POA: Diagnosis not present

## 2016-03-16 DIAGNOSIS — D0511 Intraductal carcinoma in situ of right breast: Secondary | ICD-10-CM | POA: Diagnosis not present

## 2016-03-23 DIAGNOSIS — D0511 Intraductal carcinoma in situ of right breast: Secondary | ICD-10-CM | POA: Diagnosis not present

## 2016-03-24 DIAGNOSIS — D0511 Intraductal carcinoma in situ of right breast: Secondary | ICD-10-CM | POA: Diagnosis not present

## 2016-03-25 DIAGNOSIS — D0511 Intraductal carcinoma in situ of right breast: Secondary | ICD-10-CM | POA: Diagnosis not present

## 2016-03-26 DIAGNOSIS — D0511 Intraductal carcinoma in situ of right breast: Secondary | ICD-10-CM | POA: Diagnosis not present

## 2016-03-27 DIAGNOSIS — D0511 Intraductal carcinoma in situ of right breast: Secondary | ICD-10-CM | POA: Diagnosis not present

## 2016-03-30 DIAGNOSIS — D0511 Intraductal carcinoma in situ of right breast: Secondary | ICD-10-CM | POA: Diagnosis not present

## 2016-04-01 DIAGNOSIS — D0511 Intraductal carcinoma in situ of right breast: Secondary | ICD-10-CM | POA: Diagnosis not present

## 2016-04-02 DIAGNOSIS — D0511 Intraductal carcinoma in situ of right breast: Secondary | ICD-10-CM | POA: Diagnosis not present

## 2016-04-03 DIAGNOSIS — D0511 Intraductal carcinoma in situ of right breast: Secondary | ICD-10-CM | POA: Diagnosis not present

## 2016-04-06 DIAGNOSIS — D0511 Intraductal carcinoma in situ of right breast: Secondary | ICD-10-CM | POA: Diagnosis not present

## 2016-04-07 DIAGNOSIS — D0511 Intraductal carcinoma in situ of right breast: Secondary | ICD-10-CM | POA: Diagnosis not present

## 2016-04-08 DIAGNOSIS — D0511 Intraductal carcinoma in situ of right breast: Secondary | ICD-10-CM | POA: Diagnosis not present

## 2016-04-09 DIAGNOSIS — D0511 Intraductal carcinoma in situ of right breast: Secondary | ICD-10-CM | POA: Diagnosis not present

## 2016-04-10 DIAGNOSIS — D0511 Intraductal carcinoma in situ of right breast: Secondary | ICD-10-CM | POA: Diagnosis not present

## 2016-04-13 DIAGNOSIS — D0511 Intraductal carcinoma in situ of right breast: Secondary | ICD-10-CM | POA: Diagnosis not present

## 2016-04-14 DIAGNOSIS — D0511 Intraductal carcinoma in situ of right breast: Secondary | ICD-10-CM | POA: Diagnosis not present

## 2016-04-15 DIAGNOSIS — D0511 Intraductal carcinoma in situ of right breast: Secondary | ICD-10-CM | POA: Diagnosis not present

## 2016-04-16 DIAGNOSIS — D0511 Intraductal carcinoma in situ of right breast: Secondary | ICD-10-CM | POA: Diagnosis not present

## 2016-04-17 DIAGNOSIS — D0511 Intraductal carcinoma in situ of right breast: Secondary | ICD-10-CM | POA: Diagnosis not present

## 2016-04-20 DIAGNOSIS — D0511 Intraductal carcinoma in situ of right breast: Secondary | ICD-10-CM | POA: Diagnosis not present

## 2016-04-21 ENCOUNTER — Other Ambulatory Visit (INDEPENDENT_AMBULATORY_CARE_PROVIDER_SITE_OTHER): Payer: Medicare Other

## 2016-04-21 DIAGNOSIS — IMO0001 Reserved for inherently not codable concepts without codable children: Secondary | ICD-10-CM

## 2016-04-21 DIAGNOSIS — E1165 Type 2 diabetes mellitus with hyperglycemia: Secondary | ICD-10-CM | POA: Diagnosis not present

## 2016-04-21 DIAGNOSIS — D0511 Intraductal carcinoma in situ of right breast: Secondary | ICD-10-CM | POA: Diagnosis not present

## 2016-04-21 LAB — HEMOGLOBIN A1C: Hgb A1c MFr Bld: 7.3 % — ABNORMAL HIGH (ref 4.6–6.5)

## 2016-04-28 ENCOUNTER — Ambulatory Visit (INDEPENDENT_AMBULATORY_CARE_PROVIDER_SITE_OTHER): Payer: Medicare Other | Admitting: Family Medicine

## 2016-04-28 ENCOUNTER — Encounter: Payer: Self-pay | Admitting: Family Medicine

## 2016-04-28 VITALS — BP 110/60 | HR 70 | Temp 97.8°F | Ht 66.0 in | Wt 225.8 lb

## 2016-04-28 DIAGNOSIS — D0511 Intraductal carcinoma in situ of right breast: Secondary | ICD-10-CM

## 2016-04-28 DIAGNOSIS — I2510 Atherosclerotic heart disease of native coronary artery without angina pectoris: Secondary | ICD-10-CM | POA: Diagnosis not present

## 2016-04-28 DIAGNOSIS — E1165 Type 2 diabetes mellitus with hyperglycemia: Secondary | ICD-10-CM

## 2016-04-28 DIAGNOSIS — I10 Essential (primary) hypertension: Secondary | ICD-10-CM | POA: Diagnosis not present

## 2016-04-28 DIAGNOSIS — IMO0001 Reserved for inherently not codable concepts without codable children: Secondary | ICD-10-CM

## 2016-04-28 NOTE — Patient Instructions (Addendum)
Schedule yearly eye exam.  Continue healthy  low carb diet and increase exercise.

## 2016-04-28 NOTE — Assessment & Plan Note (Signed)
Currently being treated at [facility].

## 2016-04-28 NOTE — Assessment & Plan Note (Signed)
Well controlled. Continue current medication.  

## 2016-04-28 NOTE — Progress Notes (Signed)
Subjective:    Patient ID: Terri [name], female    DOB: Sep 05, 1951, 65 y.o.   MRN: [id number]  HPI  65 year old female with  Ductal carcinoma in situ in right breast presents for 6 month DM follow up. She is s/p 5/18 lumpectomy, now s/p radiation. Completed last Tuesday. On femara. She has been tolerating well.   Diabetes:   Improved some in last 6 months, but not at goal. Lab Results  Component Value Date   HGBA1C 7.3 (H) 04/21/2016  Using medications without difficulties: Hypoglycemic episodes:none Hyperglycemic episodes: occ Feet problems: no ulcer Blood Sugars averaging: Not checking regualrly eye exam within last year: due Wt Readings from Last 3 Encounters:  04/28/16 225 lb 12 oz (102.4 kg)  11/22/15 230 lb 12 oz (104.7 kg)  10/18/15 232 lb 4 oz (105.3 kg)  Diet compliance: Moderate Exercise:walking daily Other complaints:  Hypertension:   well controlled on amlodipine and lisinopril  BP Readings from Last 3 Encounters:  04/28/16 110/60  11/22/15 110/70  10/18/15 110/62  Using medication without problems or lightheadedness:  Chest pain with exertion: None Edema: rare Short of breath: None Average home BPs: Other issues:     Review of Systems  Constitutional: Negative for fatigue and fever.  HENT: Negative for ear pain.   Eyes: Negative for pain.  Respiratory: Negative for chest tightness and shortness of breath.   Cardiovascular: Negative for chest pain, palpitations and leg swelling.  Gastrointestinal: Negative for abdominal pain.  Genitourinary: Negative for dysuria.       Objective:   Physical Exam  Constitutional: Vital signs are normal. She appears well-developed and well-nourished. She is cooperative.  Non-toxic appearance. She does not appear ill. No distress.  HENT:  Head: Normocephalic.  Right Ear: Hearing, tympanic membrane, external ear and ear canal normal. Tympanic membrane is not erythematous, not retracted and not bulging.  Left  Ear: Hearing, tympanic membrane, external ear and ear canal normal. Tympanic membrane is not erythematous, not retracted and not bulging.  Nose: No mucosal edema or rhinorrhea. Right sinus exhibits no maxillary sinus tenderness and no frontal sinus tenderness. Left sinus exhibits no maxillary sinus tenderness and no frontal sinus tenderness.  Mouth/Throat: Uvula is midline, oropharynx is clear and moist and mucous membranes are normal.  Eyes: Conjunctivae, EOM and lids are normal. Pupils are equal, round, and reactive to light. Lids are everted and swept, no foreign bodies found.  Neck: Trachea normal and normal range of motion. Neck supple. Carotid bruit is not present. No thyroid mass and no thyromegaly present.  Cardiovascular: Normal rate, regular rhythm, S1 normal, S2 normal, normal heart sounds, intact distal pulses and normal pulses.  Exam reveals no gallop and no friction rub.   No murmur heard. Pulmonary/Chest: Effort normal and breath sounds normal. No tachypnea. No respiratory distress. She has no decreased breath sounds. She has no wheezes. She has no rhonchi. She has no rales.  Abdominal: Soft. Normal appearance and bowel sounds are normal. There is no tenderness.  Neurological: She is alert.  Skin: Skin is warm, dry and intact. No rash noted.  Psychiatric: Her speech is normal and behavior is normal. Judgment and thought content normal. Her mood appears not anxious. Cognition and memory are normal. She does not exhibit a depressed mood.    Diabetic foot exam: Normal inspection except nodule on soles of feet. No skin breakdown No calluses  Normal DP pulses Normal sensation to light touch and monofilament Nails normal  Assessment & Plan:

## 2016-04-28 NOTE — Assessment & Plan Note (Signed)
Improved control, but not yet at goal. Will work more aggressively on wieght loss, low carb diet and increase exercise. She has already lost 7 lbs.

## 2016-04-28 NOTE — Assessment & Plan Note (Signed)
Asymptomatic. 

## 2016-04-28 NOTE — Progress Notes (Signed)
Pre visit review using our clinic review tool, if applicable. No additional management support is needed unless otherwise documented below in the visit note. 

## 2016-07-21 ENCOUNTER — Other Ambulatory Visit: Payer: Self-pay | Admitting: Family Medicine

## 2016-07-21 DIAGNOSIS — E785 Hyperlipidemia, unspecified: Secondary | ICD-10-CM

## 2016-07-21 DIAGNOSIS — D0511 Intraductal carcinoma in situ of right breast: Secondary | ICD-10-CM | POA: Diagnosis not present

## 2016-07-23 ENCOUNTER — Telehealth: Payer: Self-pay | Admitting: Family Medicine

## 2016-07-23 DIAGNOSIS — E1165 Type 2 diabetes mellitus with hyperglycemia: Secondary | ICD-10-CM

## 2016-07-23 DIAGNOSIS — E782 Mixed hyperlipidemia: Secondary | ICD-10-CM

## 2016-07-23 DIAGNOSIS — IMO0001 Reserved for inherently not codable concepts without codable children: Secondary | ICD-10-CM

## 2016-07-23 NOTE — Telephone Encounter (Signed)
-----   Message from Baldomero LamyNatasha C Chavers sent at 07/17/2016  3:31 PM EDT ----- Regarding: Dm f/u labs Fri 10/27, need orders. Thanks! :-) Please order  future dm f/u labs for pt's upcoming lab appt. Thanks Rodney Boozeasha

## 2016-07-27 ENCOUNTER — Other Ambulatory Visit (INDEPENDENT_AMBULATORY_CARE_PROVIDER_SITE_OTHER): Payer: Medicare Other

## 2016-07-27 DIAGNOSIS — E1165 Type 2 diabetes mellitus with hyperglycemia: Secondary | ICD-10-CM

## 2016-07-27 DIAGNOSIS — E782 Mixed hyperlipidemia: Secondary | ICD-10-CM

## 2016-07-27 DIAGNOSIS — IMO0001 Reserved for inherently not codable concepts without codable children: Secondary | ICD-10-CM

## 2016-07-27 LAB — COMPREHENSIVE METABOLIC PANEL
ALT: 24 U/L (ref 0–35)
AST: 18 U/L (ref 0–37)
Albumin: 4.1 g/dL (ref 3.5–5.2)
Alkaline Phosphatase: 104 U/L (ref 39–117)
BUN: 9 mg/dL (ref 6–23)
CO2: 33 mEq/L — ABNORMAL HIGH (ref 19–32)
Calcium: 9.5 mg/dL (ref 8.4–10.5)
Chloride: 103 mEq/L (ref 96–112)
Creatinine, Ser: 0.76 mg/dL (ref 0.40–1.20)
GFR: 97.64 mL/min (ref >60.00)
Glucose, Bld: 176 mg/dL — ABNORMAL HIGH (ref 70–99)
Potassium: 3.8 mEq/L (ref 3.5–5.1)
Sodium: 143 mEq/L (ref 135–145)
Total Bilirubin: 0.5 mg/dL (ref 0.2–1.2)
Total Protein: 6.9 g/dL (ref 6.0–8.3)

## 2016-07-27 LAB — LIPID PANEL
Cholesterol: 203 mg/dL — ABNORMAL HIGH (ref 0–200)
HDL: 56 mg/dL (ref >39.00)
LDL Cholesterol: 109 mg/dL — ABNORMAL HIGH (ref 0–99)
NonHDL: 146.57
Total CHOL/HDL Ratio: 4
Triglycerides: 187 mg/dL — ABNORMAL HIGH (ref 0.0–149.0)
VLDL: 37.4 mg/dL (ref 0.0–40.0)

## 2016-07-27 LAB — HEMOGLOBIN A1C: Hgb A1c MFr Bld: 7.7 % — ABNORMAL HIGH (ref 4.6–6.5)

## 2016-07-28 DIAGNOSIS — D0511 Intraductal carcinoma in situ of right breast: Secondary | ICD-10-CM | POA: Diagnosis not present

## 2016-07-28 DIAGNOSIS — Z9189 Other specified personal risk factors, not elsewhere classified: Secondary | ICD-10-CM | POA: Diagnosis not present

## 2016-07-31 ENCOUNTER — Encounter: Payer: Self-pay | Admitting: Family Medicine

## 2016-07-31 ENCOUNTER — Ambulatory Visit (INDEPENDENT_AMBULATORY_CARE_PROVIDER_SITE_OTHER): Payer: Medicare Other | Admitting: Family Medicine

## 2016-07-31 DIAGNOSIS — I10 Essential (primary) hypertension: Secondary | ICD-10-CM | POA: Diagnosis not present

## 2016-07-31 DIAGNOSIS — E1165 Type 2 diabetes mellitus with hyperglycemia: Secondary | ICD-10-CM

## 2016-07-31 DIAGNOSIS — E782 Mixed hyperlipidemia: Secondary | ICD-10-CM | POA: Diagnosis not present

## 2016-07-31 DIAGNOSIS — IMO0001 Reserved for inherently not codable concepts without codable children: Secondary | ICD-10-CM

## 2016-07-31 DIAGNOSIS — I2510 Atherosclerotic heart disease of native coronary artery without angina pectoris: Secondary | ICD-10-CM | POA: Diagnosis not present

## 2016-07-31 NOTE — Progress Notes (Signed)
65 year old female with  Ductal carcinoma in situ in right breast presents for 3 month DM follow up. She is s/p 5/18 lumpectomy, now s/p radiation.  On femara. She has been tolerating well.   Diabetes:   Inadequate control not at goal. Worsened from last check.  On metformin 1500 mg daily. Lab Results  Component Value Date   HGBA1C 7.7 (H) 07/27/2016  Using medications without difficulties: Hypoglycemic episodes:none Hyperglycemic episodes: occ Feet problems: no ulcer Blood Sugars averaging: Not checking regualrly eye exam within last year: due Wt Readings from Last 3 Encounters:  07/31/16 225 lb 12 oz (102.4 kg)  04/28/16 225 lb 12 oz (102.4 kg)  11/22/15 230 lb 12 oz (104.7 kg)  Diet compliance: Poor  Lately, eating out a lot Exercise:walking daily Other complaints:  Hypertension:   well controlled on amlodipine and lisinopril  BP Readings from Last 3 Encounters:  07/31/16 110/62  04/28/16 110/60  11/22/15 110/70  Using medication without problems or lightheadedness:  Chest pain with exertion: None Edema: rare Short of breath: None Average home BPs: not checking Other issues:  Elevated Cholesterol: Poor control on 20 mg daily atorvastatin. Using medications without problems: Muscle aches:  None     Review of Systems  Constitutional: Negative for fatigue and fever.  HENT: Negative for ear pain.   Eyes: Negative for pain.  Respiratory: Negative for chest tightness and shortness of breath.   Cardiovascular: Negative for chest pain, palpitations and leg swelling.  Gastrointestinal: Negative for abdominal pain.  Genitourinary: Negative for dysuria.       Objective:   Physical Exam  Constitutional: Vital signs are normal. She appears well-developed and well-nourished. She is cooperative.  Non-toxic appearance. She does not appear ill. No distress.  HENT:  Head: Normocephalic.  Right Ear: Hearing, tympanic membrane, external ear and ear canal normal.  Tympanic membrane is not erythematous, not retracted and not bulging.  Left Ear: Hearing, tympanic membrane, external ear and ear canal normal. Tympanic membrane is not erythematous, not retracted and not bulging.  Nose: No mucosal edema or rhinorrhea. Right sinus exhibits no maxillary sinus tenderness and no frontal sinus tenderness. Left sinus exhibits no maxillary sinus tenderness and no frontal sinus tenderness.  Mouth/Throat: Uvula is midline, oropharynx is clear and moist and mucous membranes are normal.  Eyes: Conjunctivae, EOM and lids are normal. Pupils are equal, round, and reactive to light. Lids are everted and swept, no foreign bodies found.  Neck: Trachea normal and normal range of motion. Neck supple. Carotid bruit is not present. No thyroid mass and no thyromegaly present.  Cardiovascular: Normal rate, regular rhythm, S1 normal, S2 normal, normal heart sounds, intact distal pulses and normal pulses.  Exam reveals no gallop and no friction rub.   No murmur heard. Pulmonary/Chest: Effort normal and breath sounds normal. No tachypnea. No respiratory distress. She has no decreased breath sounds. She has no wheezes. She has no rhonchi. She has no rales.  Abdominal: Soft. Normal appearance and bowel sounds are normal. There is no tenderness.  Neurological: She is alert.  Skin: Skin is warm, dry and intact. No rash noted.  Psychiatric: Her speech is normal and behavior is normal. Judgment and thought content normal. Her mood appears not anxious. Cognition and memory are normal. She does not exhibit a depressed mood.    Diabetic foot exam: Normal inspection except nodule on soles of feet. No skin breakdown No calluses  Normal DP pulses Normal sensation to light touch and monofilament  Nails normal

## 2016-07-31 NOTE — Patient Instructions (Addendum)
Increase to 1 full tab of atorvastain daily. Work on low carb and low chol diet, increase exercise, work on weight loss. Avoid fast food. Schedule yearly eye exam.

## 2016-07-31 NOTE — Assessment & Plan Note (Signed)
Well controlled. Continue current medication.  

## 2016-07-31 NOTE — Assessment & Plan Note (Signed)
Poor control.. Was better on 1 full tab daily... Increase back to 40 mg daily.

## 2016-07-31 NOTE — Assessment & Plan Note (Signed)
Asymptomatic. 

## 2016-07-31 NOTE — Progress Notes (Signed)
Pre visit review using our clinic review tool, if applicable. No additional management support is needed unless otherwise documented below in the visit note. 

## 2016-07-31 NOTE — Assessment & Plan Note (Signed)
Pt hesitant about increasing metformin.. Wants to make changes with diet. Reviewed diet and lifestyle changes needed. Refused referral to nutritionist.

## 2016-08-04 ENCOUNTER — Ambulatory Visit: Payer: Medicare Other

## 2016-09-15 DIAGNOSIS — Z7984 Long term (current) use of oral hypoglycemic drugs: Secondary | ICD-10-CM | POA: Diagnosis not present

## 2016-09-15 DIAGNOSIS — H5213 Myopia, bilateral: Secondary | ICD-10-CM | POA: Diagnosis not present

## 2016-09-15 DIAGNOSIS — H52223 Regular astigmatism, bilateral: Secondary | ICD-10-CM | POA: Diagnosis not present

## 2016-09-15 DIAGNOSIS — E119 Type 2 diabetes mellitus without complications: Secondary | ICD-10-CM | POA: Diagnosis not present

## 2016-09-15 DIAGNOSIS — H524 Presbyopia: Secondary | ICD-10-CM | POA: Diagnosis not present

## 2016-09-15 LAB — HM DIABETES EYE EXAM

## 2016-09-20 ENCOUNTER — Other Ambulatory Visit: Payer: Self-pay | Admitting: Family Medicine

## 2016-09-20 DIAGNOSIS — K219 Gastro-esophageal reflux disease without esophagitis: Secondary | ICD-10-CM

## 2016-09-22 ENCOUNTER — Encounter: Payer: Self-pay | Admitting: Family Medicine

## 2016-09-22 ENCOUNTER — Ambulatory Visit (INDEPENDENT_AMBULATORY_CARE_PROVIDER_SITE_OTHER): Payer: Medicare Other | Admitting: Family Medicine

## 2016-09-22 VITALS — BP 110/68 | HR 81 | Temp 98.3°F | Ht 66.0 in | Wt 227.2 lb

## 2016-09-22 DIAGNOSIS — J209 Acute bronchitis, unspecified: Secondary | ICD-10-CM | POA: Diagnosis not present

## 2016-09-22 DIAGNOSIS — I2510 Atherosclerotic heart disease of native coronary artery without angina pectoris: Secondary | ICD-10-CM

## 2016-09-22 MED ORDER — BENZONATATE 200 MG PO CAPS: 200.0000 mg | ORAL_CAPSULE | Freq: Three times a day (TID) | ORAL | 0 refills | Status: DC | PRN

## 2016-09-22 MED ORDER — AZITHROMYCIN 250 MG PO TABS: ORAL_TABLET | ORAL | 0 refills | Status: DC

## 2016-09-22 NOTE — Progress Notes (Signed)
Pre visit review using our clinic review tool, if applicable. No additional management support is needed unless otherwise documented below in the visit note. 

## 2016-09-22 NOTE — Assessment & Plan Note (Signed)
S/p uri for over a month  Re assuring exam  Some nasal and eye symptoms  Disc symptomatic care - see instructions on AVS  Px zpak in light of length of illness Tessalon for cough prn  Update if not starting to improve in a week or if worsening    Consider changing ace if cough continues Get flu and Pneumovax when feeling better

## 2016-09-22 NOTE — Progress Notes (Signed)
Subjective:    Patient ID: Terri [name], female    DOB: Apr 30, 1951, 65 y.o.   MRN: [id number]  HPI Here for cough   Over a month of symptoms /cold symptoms  Got worse over the weekend  Drippy eyes  Lost voice  Phlegm - prod cough/ yellow  No fever -no aches or chills   May have caught this from family member   Some headache today   A lot of post nasal drip and nasal congestion   Over the counter she started with alka selzer plus  Then mucinex   Patient Active Problem List   Diagnosis Date Noted  . Acute bronchitis 09/22/2016  . Ductal carcinoma in situ (DCIS) of right breast 04/28/2016  . Advanced care planning/counseling discussion 11/22/2015  . Episodic cluster headache, not intractable 12/19/2014  . Preventative health care 11/26/2014  . Diabetes mellitus type 2, uncontrolled (HCC) 12/07/2007  . Hyperlipidemia 12/07/2007  . Essential hypertension 12/07/2007  . CAD (coronary artery disease) 12/07/2007  . GERD 12/07/2007   Past Medical History:  Diagnosis Date  . Abdominal mass   . Abnormal coagulation profile   . Arthritis   . CAD (coronary artery disease)   . Diabetes mellitus type II, uncontrolled (HCC)   . GERD (gastroesophageal reflux disease)   . Hyperlipidemia   . Hypertension   . Mesenteric mass 07/2008   complicated by nausea & vomiting  . Other postoperative infection   . S/P PICC central line placement 10/2008   line sepsis-right PICC line removed    Past Surgical History:  Procedure Laterality Date  . ABDOMINAL MASS RESECTION  07/2008   Mesentertic Mass resection  . TOTAL ABDOMINAL HYSTERECTOMY  1993   Social History  Substance Use Topics  . Smoking status: Never Smoker  . Smokeless tobacco: Never Used  . Alcohol use No   Family History  Problem Relation Age of Onset  . Coronary artery disease Mother   . Hypertension Mother   . Arthritis Mother   . Brain cancer Father 474    deceased  . Arthritis Father   . Diabetes Sister   .  Diabetes Brother      X 2  . Coronary artery disease Brother   . Prostate cancer Brother   . Diabetes Brother    Allergies  Allergen Reactions  . Metoclopramide     Other reaction(s): Unknown  . Morphine     Other reaction(s): Unknown   Current Outpatient Prescriptions on File Prior to Visit  Medication Sig Dispense Refill  . amLODipine (NORVASC) 10 MG tablet TAKE 1 TABLET DAILY 90 tablet 1  . aspirin 81 MG tablet Take 81 mg by mouth daily.    Marland Kitchen. atorvastatin (LIPITOR) 40 MG tablet Take 40 mg by mouth daily.    Marland Kitchen. glucose blood test strip 1 each by Other route 3 (three) times daily as needed for other. Use as instructed 100 each 3  . letrozole (FEMARA) 2.5 MG tablet Take 2.5 mg by mouth daily.    Marland Kitchen. lisinopril (PRINIVIL,ZESTRIL) 20 MG tablet TAKE 1 TABLET DAILY 90 tablet 1  . Lysine HCl 500 MG TABS Take 1 tablet (500 mg total) by mouth daily. 90 tablet 1  . metFORMIN (GLUCOPHAGE-XR) 750 MG 24 hr tablet TAKE 1 TABLET TWICE A DAY WITH MEALS 180 tablet 1  . nortriptyline (PAMELOR) 10 MG capsule Take 1 capsule (10 mg total) by mouth at bedtime. 90 capsule 3  . nystatin cream (MYCOSTATIN) Apply 1 application  topically 2 (two) times daily. 30 g 1  . pantoprazole (PROTONIX) 40 MG tablet TAKE 1 TABLET DAILY 90 tablet 1   No current facility-administered medications on file prior to visit.     Review of Systems  Constitutional: Positive for appetite change and fatigue. Negative for fever.  HENT: Positive for congestion, postnasal drip, rhinorrhea, sinus pressure, sneezing and sore throat. Negative for ear pain.   Eyes: Positive for discharge, redness and itching. Negative for pain and visual disturbance.  Respiratory: Positive for cough. Negative for shortness of breath, wheezing and stridor.   Cardiovascular: Negative for chest pain.  Gastrointestinal: Negative for diarrhea, nausea and vomiting.  Genitourinary: Negative for frequency, hematuria and urgency.  Musculoskeletal: Negative for  arthralgias and myalgias.  Skin: Negative for rash.  Neurological: Positive for headaches. Negative for dizziness, weakness and light-headedness.  Psychiatric/Behavioral: Negative for confusion and dysphoric mood.       Objective:   Physical Exam  Constitutional: She appears well-developed and well-nourished. No distress.  overwt and well appearing   HENT:  Head: Normocephalic and atraumatic.  Right Ear: External ear normal.  Left Ear: External ear normal.  Mouth/Throat: Oropharynx is clear and moist.  Nares are injected and congested  No sinus tenderness Clear rhinorrhea and post nasal drip   Eyes: EOM are normal. Pupils are equal, round, and reactive to light. Right eye exhibits no discharge. Left eye exhibits no discharge.  bilat mild conj injection with clear d/c  No swelling  Grossly nl vision   Neck: Normal range of motion. Neck supple.  Cardiovascular: Normal rate and normal heart sounds.   Pulmonary/Chest: Effort normal and breath sounds normal. No respiratory distress. She has no wheezes. She has no rales. She exhibits no tenderness.  Some upper airway sounds Hoarse voice   No rales/rhochi or wheeze   Lymphadenopathy:    She has no cervical adenopathy.  Neurological: She is alert.  Skin: Skin is warm and dry. No rash noted.  Psychiatric: She has a normal mood and affect.          Assessment & Plan:   Problem List Items Addressed This Visit      Respiratory   Acute bronchitis    S/p uri for over a month  Re assuring exam  Some nasal and eye symptoms  Disc symptomatic care - see instructions on AVS  Px zpak in light of length of illness Tessalon for cough prn  Update if not starting to improve in a week or if worsening    Consider changing ace if cough continues Get flu and Pneumovax when feeling better

## 2016-09-22 NOTE — Patient Instructions (Addendum)
Take the zithromax as directed Drink lots of fluids  Try the tessalon pills for cough  Rest  Breathe steam -it helps congestion as well   Update if not starting to improve in a week or if worsening    If the cough continues beyond the other symptoms- talk to your doctor about the lisinopril   Get your flu shot when you get better

## 2016-10-28 DIAGNOSIS — N644 Mastodynia: Secondary | ICD-10-CM | POA: Diagnosis not present

## 2016-10-28 DIAGNOSIS — D0511 Intraductal carcinoma in situ of right breast: Secondary | ICD-10-CM | POA: Diagnosis not present

## 2016-10-28 DIAGNOSIS — Z23 Encounter for immunization: Secondary | ICD-10-CM | POA: Diagnosis not present

## 2016-10-28 DIAGNOSIS — Z9189 Other specified personal risk factors, not elsewhere classified: Secondary | ICD-10-CM | POA: Diagnosis not present

## 2016-10-28 DIAGNOSIS — E669 Obesity, unspecified: Secondary | ICD-10-CM | POA: Diagnosis not present

## 2016-11-13 ENCOUNTER — Other Ambulatory Visit: Payer: Self-pay | Admitting: *Deleted

## 2016-11-13 MED ORDER — ATORVASTATIN CALCIUM 40 MG PO TABS: 40.0000 mg | ORAL_TABLET | Freq: Every day | ORAL | 2 refills | Status: DC

## 2016-11-30 ENCOUNTER — Telehealth: Payer: Self-pay | Admitting: Family Medicine

## 2016-11-30 ENCOUNTER — Ambulatory Visit (INDEPENDENT_AMBULATORY_CARE_PROVIDER_SITE_OTHER): Payer: Medicare Other

## 2016-11-30 VITALS — BP 116/80 | HR 89 | Temp 97.7°F | Ht 66.25 in | Wt 227.2 lb

## 2016-11-30 DIAGNOSIS — E1165 Type 2 diabetes mellitus with hyperglycemia: Secondary | ICD-10-CM | POA: Diagnosis not present

## 2016-11-30 DIAGNOSIS — E782 Mixed hyperlipidemia: Secondary | ICD-10-CM

## 2016-11-30 DIAGNOSIS — Z0000 Encounter for general adult medical examination without abnormal findings: Secondary | ICD-10-CM

## 2016-11-30 DIAGNOSIS — IMO0001 Reserved for inherently not codable concepts without codable children: Secondary | ICD-10-CM

## 2016-11-30 LAB — COMPREHENSIVE METABOLIC PANEL
ALBUMIN: 4.3 g/dL (ref 3.5–5.2)
ALT: 15 U/L (ref 0–35)
AST: 14 U/L (ref 0–37)
Alkaline Phosphatase: 102 U/L (ref 39–117)
BILIRUBIN TOTAL: 0.4 mg/dL (ref 0.2–1.2)
BUN: 13 mg/dL (ref 6–23)
CALCIUM: 10.1 mg/dL (ref 8.4–10.5)
CO2: 31 mEq/L (ref 19–32)
CREATININE: 0.82 mg/dL (ref 0.40–1.20)
Chloride: 102 mEq/L (ref 96–112)
GFR: 89.35 mL/min (ref >60.00)
Glucose, Bld: 153 mg/dL — ABNORMAL HIGH (ref 70–99)
Potassium: 5.2 mEq/L — ABNORMAL HIGH (ref 3.5–5.1)
Sodium: 140 mEq/L (ref 135–145)
Total Protein: 7.2 g/dL (ref 6.0–8.3)

## 2016-11-30 LAB — LIPID PANEL
CHOLESTEROL: 123 mg/dL (ref 0–200)
HDL: 45.5 mg/dL (ref >39.00)
LDL Cholesterol: 57 mg/dL (ref 0–99)
NonHDL: 77.28
TRIGLYCERIDES: 101 mg/dL (ref 0.0–149.0)
Total CHOL/HDL Ratio: 3
VLDL: 20.2 mg/dL (ref 0.0–40.0)

## 2016-11-30 LAB — HEMOGLOBIN A1C: Hgb A1c MFr Bld: 8.5 % — ABNORMAL HIGH (ref 4.6–6.5)

## 2016-11-30 NOTE — Progress Notes (Signed)
I reviewed health advisor's note, was available for consultation, and agree with documentation and plan.   Signed,  Rosemary Pentecost T. Brighten Orndoff, MD  

## 2016-11-30 NOTE — Progress Notes (Signed)
Pre visit review using our clinic review tool, if applicable. No additional management support is needed unless otherwise documented below in the visit note. 

## 2016-11-30 NOTE — Progress Notes (Signed)
PCP notes:   Health maintenance:  PPSV23 - pt will discuss with PCP Colon cancer screening -pt will discuss with PCP Foot exam - PCP will complete at CPE A1C - completed  Eye exam - pt reported exam in Jan 2018 Flu vaccine - pt reported administered in Feb 2018  Abnormal screenings:   None  Patient concerns:   None  Nurse concerns:  None  Next PCP appt:   12/03/16 @ 1000

## 2016-11-30 NOTE — Patient Instructions (Signed)
[name] , Thank you for taking time to come for your Medicare Wellness Visit. I appreciate your ongoing commitment to your health goals. Please review the following plan we discussed and let me know if I can assist you in the future.   These are the goals we discussed: Goals    . Increase physical activity          Starting 11/30/16, I will continue to walk at least 60 min 2-3 days per week.        This is a list of the screening recommended for you and due dates:  Health Maintenance  Topic Date Due  . Pneumonia vaccines (2 of 2 - PPSV23) 07/31/2017*  . Colon Cancer Screening  10/10/2017*  . Complete foot exam   11/20/2017*  . Hemoglobin A1C  01/25/2017  . Eye exam for diabetics  10/12/2017  . Mammogram  01/05/2018  . Tetanus Vaccine  11/25/2024  . Flu Shot  Addressed  . DEXA scan (bone density measurement)  Completed  .  Hepatitis C: One time screening is recommended by Center for Disease Control  (CDC) for  adults born from [birth year] through 1967.   Completed  *Topic was postponed. The date shown is not the original due date.   Preventive Care for Adults  A healthy lifestyle and preventive care can promote health and wellness. Preventive health guidelines for adults include the following key practices.  . A routine yearly physical is a good way to check with your health care provider about your health and preventive screening. It is a chance to share any concerns and updates on your health and to receive a thorough exam.  . Visit your dentist for a routine exam and preventive care every 6 months. Brush your teeth twice a day and floss once a day. Good oral hygiene prevents tooth decay and gum disease.  . The frequency of eye exams is based on your age, health, family medical history, use  of contact lenses, and other factors. Follow your health care provider's ecommendations for frequency of eye exams.  . Eat a healthy diet. Foods like vegetables, fruits, whole grains, low-fat  dairy products, and lean protein foods contain the nutrients you need without too many calories. Decrease your intake of foods high in solid fats, added sugars, and salt. Eat the right amount of calories for you. Get information about a proper diet from your health care provider, if necessary.  . Regular physical exercise is one of the most important things you can do for your health. Most adults should get at least 150 minutes of moderate-intensity exercise (any activity that increases your heart rate and causes you to sweat) each week. In addition, most adults need muscle-strengthening exercises on 2 or more days a week.  Silver Sneakers may be a benefit available to you. To determine eligibility, you may visit the website: www.silversneakers.com or contact program at [phone] Mon-Fri between 8AM-8PM.   . Maintain a healthy weight. The body mass index (BMI) is a screening tool to identify possible weight problems. It provides an estimate of body fat based on height and weight. Your health care provider can find your BMI and can help you achieve or maintain a healthy weight.   For adults 20 years and older: ? A BMI below 18.5 is considered underweight. ? A BMI of 18.5 to 24.9 is normal. ? A BMI of 25 to 29.9 is considered overweight. ? A BMI of 30 and above is considered obese.   .Marland Kitchen  Maintain normal blood lipids and cholesterol levels by exercising and minimizing your intake of saturated fat. Eat a balanced diet with plenty of fruit and vegetables. Blood tests for lipids and cholesterol should begin at age 76 and be repeated every 5 years. If your lipid or cholesterol levels are high, you are over 50, or you are at high risk for heart disease, you may need your cholesterol levels checked more frequently. Ongoing high lipid and cholesterol levels should be treated with medicines if diet and exercise are not working.  . If you smoke, find out from your health care provider how to quit. If you do  not use tobacco, please do not start.  . If you choose to drink alcohol, please do not consume more than 2 drinks per day. One drink is considered to be 12 ounces (355 mL) of beer, 5 ounces (148 mL) of wine, or 1.5 ounces (44 mL) of liquor.  . If you are 73-46 years old, ask your health care provider if you should take aspirin to prevent strokes.  . Use sunscreen. Apply sunscreen liberally and repeatedly throughout the day. You should seek shade when your shadow is shorter than you. Protect yourself by wearing long sleeves, pants, a wide-brimmed hat, and sunglasses year round, whenever you are outdoors.  . Once a month, do a whole body skin exam, using a mirror to look at the skin on your back. Tell your health care provider of new moles, moles that have irregular borders, moles that are larger than a pencil eraser, or moles that have changed in shape or color.

## 2016-11-30 NOTE — Progress Notes (Signed)
Subjective:   Terri [name] is a 66 y.o. female who presents for Medicare Annual (Subsequent) preventive examination.  Review of Systems:  N/A Cardiac Risk Factors include: advanced age (>32men, >56 women);diabetes mellitus;obesity (BMI >30kg/m2);dyslipidemia;hypertension     Objective:     Vitals: BP 116/80 (BP Location: Left Arm, Patient Position: Sitting, Cuff Size: Normal)   Pulse 89   Temp 97.7 F (36.5 C) (Oral)   Ht 5' 6.25" (1.683 m) Comment: no shoes  Wt 227 lb 4 oz (103.1 kg)   SpO2 94%   BMI 36.40 kg/m   Body mass index is 36.4 kg/m.   Tobacco History  Smoking Status  . Never Smoker  Smokeless Tobacco  . Never Used     Counseling given: No   Past Medical History:  Diagnosis Date  . Abdominal mass   . Abnormal coagulation profile   . Arthritis   . CAD (coronary artery disease)   . Diabetes mellitus type II, uncontrolled (HCC)   . GERD (gastroesophageal reflux disease)   . Hyperlipidemia   . Hypertension   . Mesenteric mass 07/2008   complicated by nausea & vomiting  . Other postoperative infection   . S/P PICC central line placement 10/2008   line sepsis-right PICC line removed    Past Surgical History:  Procedure Laterality Date  . ABDOMINAL MASS RESECTION  07/2008   Mesentertic Mass resection  . BREAST SURGERY Right 01/27/2016   [facility]  . TOTAL ABDOMINAL HYSTERECTOMY  1993   Family History  Problem Relation Age of Onset  . Coronary artery disease Mother   . Hypertension Mother   . Arthritis Mother   . Brain cancer Father 48    deceased  . Arthritis Father   . Diabetes Sister   . Diabetes Brother      X 2  . Coronary artery disease Brother   . Prostate cancer Brother   . Diabetes Brother    History  Sexual Activity  . Sexual activity: Yes    Outpatient Encounter Prescriptions as of 11/30/2016  Medication Sig  . amLODipine (NORVASC) 10 MG tablet TAKE 1 TABLET DAILY  . aspirin 81 MG tablet Take 81 mg by mouth daily.  Marland Kitchen  atorvastatin (LIPITOR) 40 MG tablet Take 1 tablet (40 mg total) by mouth daily.  . benzonatate (TESSALON) 200 MG capsule Take 1 capsule (200 mg total) by mouth 3 (three) times daily as needed for cough. Swallow whole- do not bite pill  . Cholecalciferol (VITAMIN D3 PO) Take 2,000 Units by mouth daily.  . ergocalciferol (VITAMIN D2) 50000 units capsule Take 50,000 Units by mouth once a week.  Marland Kitchen glucose blood test strip 1 each by Other route 3 (three) times daily as needed for other. Use as instructed  . letrozole (FEMARA) 2.5 MG tablet Take 2.5 mg by mouth daily.  Marland Kitchen lisinopril (PRINIVIL,ZESTRIL) 20 MG tablet TAKE 1 TABLET DAILY  . Lysine HCl 500 MG TABS Take 1 tablet (500 mg total) by mouth daily.  . metFORMIN (GLUCOPHAGE-XR) 750 MG 24 hr tablet TAKE 1 TABLET TWICE A DAY WITH MEALS  . nortriptyline (PAMELOR) 10 MG capsule Take 1 capsule (10 mg total) by mouth at bedtime.  Marland Kitchen nystatin cream (MYCOSTATIN) Apply 1 application topically 2 (two) times daily.  . pantoprazole (PROTONIX) 40 MG tablet TAKE 1 TABLET DAILY  . [DISCONTINUED] azithromycin (ZITHROMAX Z-PAK) 250 MG tablet Take 2 pills by mouth today and then 1 pill daily for 4 days (Patient not taking: Reported  on 11/30/2016)   No facility-administered encounter medications on file as of 11/30/2016.     Activities of Daily Living In your present state of health, do you have any difficulty performing the following activities: 11/30/2016  Hearing? N  Vision? N  Difficulty concentrating or making decisions? N  Walking or climbing stairs? N  Dressing or bathing? N  Doing errands, shopping? N  Preparing Food and eating ? N  Using the Toilet? N  In the past six months, have you accidently leaked urine? N  Do you have problems with loss of bowel control? N  Managing your Medications? N  Managing your Finances? N  Housekeeping or managing your Housekeeping? N  Some recent data might be hidden    Patient Care Team: Excell SeltzerAmy E Bedsole, MD as PCP -  General (Family Medicine) Wynetta EmeryGita Suneja, MD as Referring Physician (Radiation Oncology) Arlie SolomonsPaul Kelly Marcom, MD as Referring Physician (Hematology and Oncology) Roselyn ReefAngela B Sellers, OD as Consulting Physician (Optometry)    Assessment:     Hearing Screening   125Hz  250Hz  500Hz  1000Hz  2000Hz  3000Hz  4000Hz  6000Hz  8000Hz   Right ear:   40 40 40  40    Left ear:   40 40 40  40    Vision Screening Comments: Last vision exam in Jan 2018 with Dr. Sallyanne KusterSellers   Exercise Activities and Dietary recommendations Current Exercise Habits: Home exercise routine, Type of exercise: walking, Time (Minutes): 60, Frequency (Times/Week): 3, Weekly Exercise (Minutes/Week): 180, Intensity: Mild, Exercise limited by: None identified  Goals    . Increase physical activity          Starting 11/30/16, I will continue to walk at least 60 min 2-3 days per week.       Fall Risk Fall Risk  11/30/2016 11/22/2015 08/27/2014  Falls in the past year? No No No   Depression Screen PHQ 2/9 Scores 11/30/2016 11/22/2015 08/27/2014  PHQ - 2 Score 0 0 0     Cognitive Function MMSE - Mini Mental State Exam 11/30/2016  Orientation to time 5  Orientation to Place 5  Registration 3  Attention/ Calculation 0  Recall 3  Language- name 2 objects 0  Language- repeat 1  Language- follow 3 step command 3  Language- read & follow direction 0  Write a sentence 0  Copy design 0  Total score 20       PLEASE NOTE: A Mini-Cog screen was completed. Maximum score is 20. A value of 0 denotes this part of Folstein MMSE was not completed or the patient failed this part of the Mini-Cog screening.   Mini-Cog Screening Orientation to Time - Max 5 pts Orientation to Place - Max 5 pts Registration - Max 3 pts Recall - Max 3 pts Language Repeat - Max 1 pts Language Follow 3 Step Command - Max 3 pts   Immunization History  Administered Date(s) Administered  . Influenza Whole 07/19/2008  . Pneumococcal Conjugate-13 11/26/2014  . Pneumococcal  Polysaccharide-23 07/19/2008  . Td 11/26/2014   Screening Tests Health Maintenance  Topic Date Due  . PNA vac Low Risk Adult (2 of 2 - PPSV23) 07/31/2017 (Originally 11/26/2015)  . COLONOSCOPY  10/10/2017 (Originally 10/11/2016)  . FOOT EXAM  11/20/2017 (Originally 11/21/2016)  . HEMOGLOBIN A1C  01/25/2017  . OPHTHALMOLOGY EXAM  10/12/2017  . MAMMOGRAM  01/05/2018  . TETANUS/TDAP  11/25/2024  . INFLUENZA VACCINE  Addressed  . DEXA SCAN  Completed  . Hepatitis C Screening  Completed  Plan:     I have personally reviewed and addressed the Medicare Annual Wellness questionnaire and have noted the following in the patient's chart:  A. Medical and social history B. Use of alcohol, tobacco or illicit drugs  C. Current medications and supplements D. Functional ability and status E.  Nutritional status F.  Physical activity G. Advance directives H. List of other physicians I.  Hospitalizations, surgeries, and ER visits in previous 12 months J.  [place] to include hearing, vision, cognitive, depression L. Referrals and appointments - none  In addition, I have reviewed and discussed with patient certain preventive protocols, quality metrics, and best practice recommendations. A written personalized care plan for preventive services as well as general preventive health recommendations were provided to patient.  See attached scanned questionnaire for additional information.   Signed,   Lindell Noe, MHA, BS, LPN Health Coach

## 2016-11-30 NOTE — Telephone Encounter (Signed)
-----   Message from Robert Bellowarlesia R Pinson, LPN sent at 3/4/74253/10/2016  4:09 PM EST ----- Regarding: Lab Orders 11/30/16 Please place lab orders. Thank you.

## 2016-12-03 ENCOUNTER — Other Ambulatory Visit: Payer: Self-pay

## 2016-12-03 ENCOUNTER — Ambulatory Visit (INDEPENDENT_AMBULATORY_CARE_PROVIDER_SITE_OTHER): Payer: Medicare Other | Admitting: Family Medicine

## 2016-12-03 ENCOUNTER — Encounter: Payer: Self-pay | Admitting: Family Medicine

## 2016-12-03 VITALS — BP 120/68 | HR 72 | Temp 98.7°F | Ht 66.25 in | Wt 228.2 lb

## 2016-12-03 DIAGNOSIS — E782 Mixed hyperlipidemia: Secondary | ICD-10-CM

## 2016-12-03 DIAGNOSIS — I2510 Atherosclerotic heart disease of native coronary artery without angina pectoris: Secondary | ICD-10-CM

## 2016-12-03 DIAGNOSIS — D0511 Intraductal carcinoma in situ of right breast: Secondary | ICD-10-CM | POA: Diagnosis not present

## 2016-12-03 DIAGNOSIS — Z1211 Encounter for screening for malignant neoplasm of colon: Secondary | ICD-10-CM | POA: Diagnosis not present

## 2016-12-03 DIAGNOSIS — I10 Essential (primary) hypertension: Secondary | ICD-10-CM | POA: Diagnosis not present

## 2016-12-03 DIAGNOSIS — K219 Gastro-esophageal reflux disease without esophagitis: Secondary | ICD-10-CM

## 2016-12-03 DIAGNOSIS — Z23 Encounter for immunization: Secondary | ICD-10-CM | POA: Diagnosis not present

## 2016-12-03 DIAGNOSIS — Z6836 Body mass index (BMI) 36.0-36.9, adult: Secondary | ICD-10-CM | POA: Diagnosis not present

## 2016-12-03 DIAGNOSIS — E1165 Type 2 diabetes mellitus with hyperglycemia: Secondary | ICD-10-CM | POA: Diagnosis not present

## 2016-12-03 DIAGNOSIS — IMO0001 Reserved for inherently not codable concepts without codable children: Secondary | ICD-10-CM

## 2016-12-03 LAB — HM DIABETES FOOT EXAM

## 2016-12-03 MED ORDER — METFORMIN HCL ER 500 MG PO TB24: 1000.0000 mg | ORAL_TABLET | Freq: Two times a day (BID) | ORAL | 11 refills | Status: DC

## 2016-12-03 MED ORDER — METFORMIN HCL ER 500 MG PO TB24: 1000.0000 mg | ORAL_TABLET | Freq: Two times a day (BID) | ORAL | 3 refills | Status: DC

## 2016-12-03 NOTE — Addendum Note (Signed)
Addended by: Damita LackLORING, Lavonda Thal S on: 12/03/2016 11:17 AM   Modules accepted: Orders

## 2016-12-03 NOTE — Progress Notes (Signed)
   Subjective:    Patient ID: Lindaann PascalLillie L Esselman, female    DOB: 1950/12/19, 66 y.o.   MRN: [id number]  HPI  Patient presents for  follow up on chronic health issues.  The patient saw Lu Duffelarlesia Pinson, LPN for medicare wellness on 3/5. Note reviewed in detail and important notes copied below. Health maintenance: PPSV23 - pt will discuss with PCP Colon cancer screening -pt will discuss with PCP Foot exam - PCP will complete at [facility] A1C - completed  Eye exam - pt reported exam in Jan 2018 Flu vaccine - pt reported administered in Feb 2018  Abnormal screenings:  None  Patient concerns:  None  Nurse concerns: None  Today 12/03/16   Hypertension:    Good control on amlodipine and lisinopril. BP Readings from Last 3 Encounters:  12/03/16 120/68  11/30/16 116/80  09/22/16 110/68  Using medication without problems or lightheadedness:  none Chest pain with exertion: none Edema:none Short of breath: none Average home BPs:  Not checking Other issues:  Diabetes:  Inadequate control despite increasing metformin to 1500 mg daily.  Trying to do low carb diet. Lab Results  Component Value Date   HGBA1C 8.5 (H) 11/30/2016  Using medications without difficulties: Hypoglycemic episodes: none Hyperglycemic episodes: Yes Feet problems: none Blood Sugars averaging: FBS 165, 2 hourspost prandially 268  Walking some. eye exam within last year: yes     Elevated Cholesterol: Improved control on atorvastatin 40 mg daily Using medications without problems: None Muscle aches:  none  CAD, MI in 2008, medically managed, on ASA 81 mg daily.  Not followed by cardiology, asymptomatic.  Breast cancer, active on femara, [facility]  On 1st year of 5 year femara. Has SE of hot flashes and hair loss.  GERD, well controlled  Using as needed pantoprazole.  Social History /Family History/Past Medical History reviewed and updated if needed. Blood pressure 120/68, pulse  72, temperature 98.7 F (37.1 C), temperature source Oral, height 5' 6.25" (1.683 m), weight 228 lb 4 oz (103.5 kg). Body mass index is 36.56 kg/m.    Wt Readings from Last 3 Encounters:  12/03/16 228 lb 4 oz (103.5 kg)  11/30/16 227 lb 4 oz (103.1 kg)  09/22/16 227 lb 4 oz (103.1 kg)   Review of Systems     Objective:   Physical Exam   Diabetic foot exam: Normal inspection.. Stable masses on soles of feet. No skin breakdown No calluses  Normal DP pulses Normal sensation to light touch and monofilament Nails normal      Assessment & Plan:  The patient's preventative maintenance and recommended screening tests for an annual wellness exam were reviewed in full today. Brought up to date unless services declined.  Counselled on the importance of diet, exercise, and its role in overall health and mortality. The patient's FH and SH was reviewed, including their home life, tobacco status, and drug and alcohol status.   Vaccines: Due for PSV23. Pap/DVE:   S/P TAH.  Mammo:  Scheduled in April. Bone Density: Plans bone density per ONC  In 10.2018 Colon:  2008, not verified. Due. Smoking Status: none ETOH/ drug use: none/none  Hep C:  done

## 2016-12-03 NOTE — Telephone Encounter (Signed)
Pt was seen early today and [facility];  Spoke with [facility] and he will d/c metformin rx. 3 month [facility]. Pt voiced understanding.

## 2016-12-03 NOTE — Assessment & Plan Note (Signed)
Well controlled. Continue current medication.  

## 2016-12-03 NOTE — Assessment & Plan Note (Signed)
Excellent improvement of LDL on moderate dose statin daily. Tolerating well.

## 2016-12-03 NOTE — Assessment & Plan Note (Signed)
Worsened control. Pt too work on diet and lifestyle changes. Increase to max metformin.

## 2016-12-03 NOTE — Assessment & Plan Note (Signed)
[facility]. Active on femara year 1/5

## 2016-12-03 NOTE — Assessment & Plan Note (Signed)
Stable asymptomatic on ASA.

## 2016-12-03 NOTE — Assessment & Plan Note (Addendum)
Associated with CAD and DM.Marland Kitchen. 2 comorbidities.  Counseled on need for weight loss to improve risk for CAD and management of DM.

## 2016-12-03 NOTE — Progress Notes (Signed)
Pre visit review using our clinic review tool, if applicable. No additional management support is needed unless otherwise documented below in the visit note. 

## 2016-12-03 NOTE — Patient Instructions (Addendum)
Increase metformin to 200 mg daily.  Work on low carb diet and increase exercise.  Stop high potassium food.  Please stop at the front desk to set up referral.     EAT less of these high potassium foods.  Nuts, such as peanuts and pistachios.  Seeds, such as sunflower seeds and pumpkin seeds.  Peas, lentils, and lima beans.  Whole grain and bran cereals and breads.  Fresh fruits and vegetables, such as apricots, avocado, bananas, cantaloupe, kiwi, oranges, tomatoes, asparagus, and potatoes.  Orange juice.  Tomato juice.  Red meats.  Yogurt.   Hyperkalemia Hyperkalemia is when you have too much potassium in your blood. Potassium is normally removed (excreted) from your body by your kidneys. If there is too much potassium in your blood, it can affect your heart's ability to function. What are the causes? Hyperkalemia may be caused by:  Taking in too much potassium. You can do this by:  Using salt substitutes. They contain large amounts of potassium.  Taking potassium supplements.  Eating foods high in potassium.  Excreting too little potassium. This can happen if:  Your kidneys are not working properly. Kidney (renal) disease, including short- or long-term renal failure, is a very common cause of hyperkalemia.  You are taking medicines that lower your excretion of potassium.  You have Addison disease.  You have a urinary tract blockage, such as kidney stones.  You are on treatment to mechanically clean your blood (dialysis) and you skip a treatment.  Releasing a high amount of potassium from your cells into your blood. This can happen with:  Injury to muscles (rhabdomyolysis) or other tissues. Most potassium is stored in your muscles.  Severe burns or infections.  Acidic blood plasma (acidosis). Acidosis can result from many diseases, such as uncontrolled diabetes. What increases the risk? The most common risk factor of hyperkalemia is kidney disease.  Other risk factors of hyperkalemia include:  Addison disease. This is a condition where your glands do not produce enough hormones.  Alcoholism or heavy drug use.  Using certain blood pressure medicines, such as angiotensin-converting enzyme (ACE) inhibitors, angiotensin II receptor blockers (ARBs), or potassium-sparing diuretics such as spironolactone.  Severe injury or burn. What are the signs or symptoms? Oftentimes, there are no signs or symptoms of hyperkalemia. However, when your potassium level becomes high enough, you may experience symptoms such as:  Irregular or very slow heartbeat.  Nausea.  Fatigue.  Tingling of the skin or numbness of the hands or feet.  Muscle weakness.  Fatigue.  Not being able to move (paralysis). You may not have any symptoms of hyperkalemia. How is this diagnosed? Hyperkalemia may be diagnosed by:  Physical exam.  Blood tests.  ECG (electrocardiogram).  Discussion of prescription and non-prescription drug use. How is this treated? Treatment for hyperkalemia is often directed at the underlying cause. In some instances, treatment may include:  Insulin.  Glucose (sugar) and water solution given through a vein (intravenous or IV).  Dialysis.  Medicines to remove the potassium from your body.  Medicines to move calcium from your bloodstream into your tissues. Follow these instructions at home:  Take medicines only as directed by your health care provider.  Do not take any supplements, natural products, herbs, or vitamins without reviewing them with your health care provider. Certain supplements and natural food products can have high amounts of potassium.  Limit your alcohol intake as directed by your health care provider.  Stop illegal drug use. If you need  help quitting, ask your health care provider.  Keep all follow-up visits as directed by your health care provider. This is important.  If you have kidney disease, you may  need to follow a low potassium diet. A dietitian can help educate you on low potassium foods. Contact a health care provider if:  You notice an irregular or very slow heartbeat.  You feel light-headed.  You feel weak.  You are nauseous.  You have tingling or numbness in your hands or feet. Get help right away if:  You have shortness of breath.  You have chest pain or discomfort.  You pass out.  You have muscle paralysis. This information is not intended to replace advice given to you by your health care provider. Make sure you discuss any questions you have with your health care provider. Document Released: 09/04/2002 Document Revised: 02/20/2016 Document Reviewed: 12/20/2013 Elsevier Interactive Patient Education  2017 [employer]

## 2016-12-31 ENCOUNTER — Other Ambulatory Visit: Payer: Self-pay | Admitting: Family Medicine

## 2017-01-19 DIAGNOSIS — D0511 Intraductal carcinoma in situ of right breast: Secondary | ICD-10-CM | POA: Diagnosis not present

## 2017-03-02 ENCOUNTER — Telehealth: Payer: Self-pay | Admitting: Family Medicine

## 2017-03-02 DIAGNOSIS — IMO0001 Reserved for inherently not codable concepts without codable children: Secondary | ICD-10-CM

## 2017-03-02 DIAGNOSIS — E1165 Type 2 diabetes mellitus with hyperglycemia: Principal | ICD-10-CM

## 2017-03-02 NOTE — Telephone Encounter (Signed)
-----   Message from Alvina Chouerri J Walsh sent at 03/02/2017  2:16 PM EDT ----- Regarding: Lab orders for Wednesday, 6.6.18 Lab orders for a 3 month follow up appt.

## 2017-03-03 ENCOUNTER — Other Ambulatory Visit (INDEPENDENT_AMBULATORY_CARE_PROVIDER_SITE_OTHER): Payer: Medicare Other

## 2017-03-03 DIAGNOSIS — IMO0001 Reserved for inherently not codable concepts without codable children: Secondary | ICD-10-CM

## 2017-03-03 DIAGNOSIS — E1165 Type 2 diabetes mellitus with hyperglycemia: Secondary | ICD-10-CM | POA: Diagnosis not present

## 2017-03-03 LAB — HEMOGLOBIN A1C: HEMOGLOBIN A1C: 7.8 % — AB (ref 4.6–6.5)

## 2017-03-05 ENCOUNTER — Ambulatory Visit (INDEPENDENT_AMBULATORY_CARE_PROVIDER_SITE_OTHER): Payer: Medicare Other | Admitting: Family Medicine

## 2017-03-05 ENCOUNTER — Encounter: Payer: Self-pay | Admitting: Family Medicine

## 2017-03-05 DIAGNOSIS — IMO0001 Reserved for inherently not codable concepts without codable children: Secondary | ICD-10-CM

## 2017-03-05 DIAGNOSIS — E1165 Type 2 diabetes mellitus with hyperglycemia: Secondary | ICD-10-CM

## 2017-03-05 DIAGNOSIS — I2510 Atherosclerotic heart disease of native coronary artery without angina pectoris: Secondary | ICD-10-CM | POA: Diagnosis not present

## 2017-03-05 NOTE — Progress Notes (Signed)
   Subjective:    Patient ID: Terri [name], female    DOB: 1951-08-17, 66 y.o.   MRN: [id number]  HPI    66 year old female presents for 3 month DM follow up.   Diabetes:   At last OV increase metformin to 2000 mg daily XR.  She has had improved control on increase dose of metfomrin A1C down from 8.5  GFR last check:  89 Lab Results  Component Value Date   HGBA1C 7.8 (H) 03/03/2017  Using medications without difficulties: Has some diarrhea, but tolerable. Hypoglycemic episodes:none Hyperglycemic episodes: none Feet problems: ? Blood Sugars averaging: not checking lately.. Since she is moving houses. eye exam within last year:   Has lost 8 lbs  with lifestyle change. Has cut out potatos. Wt Readings from Last 3 Encounters:  03/05/17 220 lb 8 oz (100 kg)  12/03/16 228 lb 4 oz (103.5 kg)  11/30/16 227 lb 4 oz (103.1 kg)      Review of Systems  Constitutional: Negative for fatigue and fever.  HENT: Negative for ear pain.   Eyes: Negative for pain.  Respiratory: Negative for chest tightness and shortness of breath.   Cardiovascular: Negative for chest pain, palpitations and leg swelling.  Gastrointestinal: Negative for abdominal pain.  Genitourinary: Negative for dysuria.       Objective:   Physical Exam  Constitutional: Vital signs are normal. She appears well-developed and well-nourished. She is cooperative.  Non-toxic appearance. She does not appear ill. No distress.  Central obesity  HENT:  Head: Normocephalic.  Right Ear: Hearing, tympanic membrane, external ear and ear canal normal. Tympanic membrane is not erythematous, not retracted and not bulging.  Left Ear: Hearing, tympanic membrane, external ear and ear canal normal. Tympanic membrane is not erythematous, not retracted and not bulging.  Nose: No mucosal edema or rhinorrhea. Right sinus exhibits no maxillary sinus tenderness and no frontal sinus tenderness. Left sinus exhibits no maxillary sinus tenderness  and no frontal sinus tenderness.  Mouth/Throat: Uvula is midline, oropharynx is clear and moist and mucous membranes are normal.  Eyes: Conjunctivae, EOM and lids are normal. Pupils are equal, round, and reactive to light. Lids are everted and swept, no foreign bodies found.  Neck: Trachea normal and normal range of motion. Neck supple. Carotid bruit is not present. No thyroid mass and no thyromegaly present.  Cardiovascular: Normal rate, regular rhythm, S1 normal, S2 normal, normal heart sounds, intact distal pulses and normal pulses.  Exam reveals no gallop and no friction rub.   No murmur heard. Pulmonary/Chest: Effort normal and breath sounds normal. No tachypnea. No respiratory distress. She has no decreased breath sounds. She has no wheezes. She has no rhonchi. She has no rales.  Abdominal: Soft. Normal appearance and bowel sounds are normal. There is no tenderness.  Neurological: She is alert.  Skin: Skin is warm, dry and intact. No rash noted.  Psychiatric: Her speech is normal and behavior is normal. Judgment and thought content normal. Her mood appears not anxious. Cognition and memory are normal. She does not exhibit a depressed mood.   Diabetic foot exam: Normal inspection, unchanged nodule on soles of bilateral feet, nontender, no redness No skin breakdown No calluses  Normal DP pulses Normal sensation to light touch and monofilament Nails normal        Assessment & Plan:

## 2017-03-05 NOTE — Assessment & Plan Note (Signed)
Improved control with minimal SE on metformin.  Follow up in 6 months. Encouraged exercise, weight loss, healthy eating habits.

## 2017-03-05 NOTE — Patient Instructions (Addendum)
Continue working on exercise and weight loss.  Continue current dose metformin. Keep up the great work!

## 2017-04-13 DIAGNOSIS — E1165 Type 2 diabetes mellitus with hyperglycemia: Secondary | ICD-10-CM | POA: Diagnosis not present

## 2017-04-13 DIAGNOSIS — R1031 Right lower quadrant pain: Secondary | ICD-10-CM | POA: Diagnosis not present

## 2017-04-13 DIAGNOSIS — N132 Hydronephrosis with renal and ureteral calculous obstruction: Secondary | ICD-10-CM | POA: Diagnosis not present

## 2017-04-13 DIAGNOSIS — N200 Calculus of kidney: Secondary | ICD-10-CM | POA: Diagnosis not present

## 2017-04-13 DIAGNOSIS — K219 Gastro-esophageal reflux disease without esophagitis: Secondary | ICD-10-CM | POA: Diagnosis not present

## 2017-04-13 DIAGNOSIS — N1330 Unspecified hydronephrosis: Secondary | ICD-10-CM | POA: Diagnosis not present

## 2017-04-13 DIAGNOSIS — I10 Essential (primary) hypertension: Secondary | ICD-10-CM | POA: Diagnosis not present

## 2017-04-14 DIAGNOSIS — N132 Hydronephrosis with renal and ureteral calculous obstruction: Secondary | ICD-10-CM | POA: Diagnosis not present

## 2017-05-04 DIAGNOSIS — D0511 Intraductal carcinoma in situ of right breast: Secondary | ICD-10-CM | POA: Diagnosis not present

## 2017-05-04 DIAGNOSIS — I972 Postmastectomy lymphedema syndrome: Secondary | ICD-10-CM | POA: Diagnosis not present

## 2017-05-04 DIAGNOSIS — E569 Vitamin deficiency, unspecified: Secondary | ICD-10-CM | POA: Diagnosis not present

## 2017-06-09 DIAGNOSIS — I890 Lymphedema, not elsewhere classified: Secondary | ICD-10-CM | POA: Diagnosis not present

## 2017-06-17 ENCOUNTER — Other Ambulatory Visit: Payer: Self-pay | Admitting: Family Medicine

## 2017-06-17 DIAGNOSIS — K219 Gastro-esophageal reflux disease without esophagitis: Secondary | ICD-10-CM

## 2017-07-14 DIAGNOSIS — I890 Lymphedema, not elsewhere classified: Secondary | ICD-10-CM | POA: Diagnosis not present

## 2017-07-21 DIAGNOSIS — I890 Lymphedema, not elsewhere classified: Secondary | ICD-10-CM | POA: Diagnosis not present

## 2017-07-27 DIAGNOSIS — D0511 Intraductal carcinoma in situ of right breast: Secondary | ICD-10-CM | POA: Diagnosis not present

## 2017-07-28 DIAGNOSIS — I890 Lymphedema, not elsewhere classified: Secondary | ICD-10-CM | POA: Diagnosis not present

## 2017-08-18 DIAGNOSIS — I890 Lymphedema, not elsewhere classified: Secondary | ICD-10-CM | POA: Diagnosis not present

## 2017-08-18 DIAGNOSIS — G8929 Other chronic pain: Secondary | ICD-10-CM | POA: Diagnosis not present

## 2017-08-18 DIAGNOSIS — M25511 Pain in right shoulder: Secondary | ICD-10-CM | POA: Diagnosis not present

## 2017-09-01 DIAGNOSIS — I890 Lymphedema, not elsewhere classified: Secondary | ICD-10-CM | POA: Diagnosis not present

## 2017-09-01 DIAGNOSIS — G8929 Other chronic pain: Secondary | ICD-10-CM | POA: Diagnosis not present

## 2017-09-01 DIAGNOSIS — M25511 Pain in right shoulder: Secondary | ICD-10-CM | POA: Diagnosis not present

## 2017-09-02 ENCOUNTER — Other Ambulatory Visit: Payer: Self-pay | Admitting: Family Medicine

## 2017-09-02 ENCOUNTER — Telehealth: Payer: Self-pay | Admitting: Family Medicine

## 2017-09-02 ENCOUNTER — Encounter: Payer: Self-pay | Admitting: Family Medicine

## 2017-09-02 DIAGNOSIS — E782 Mixed hyperlipidemia: Secondary | ICD-10-CM

## 2017-09-02 DIAGNOSIS — E1165 Type 2 diabetes mellitus with hyperglycemia: Secondary | ICD-10-CM

## 2017-09-02 NOTE — Telephone Encounter (Signed)
-----   Message from Alvina Chouerri J Walsh sent at 08/25/2017  4:05 PM EST ----- Regarding: Lab orders for Friday, 12.7.18 Lab orders for f/u appt

## 2017-09-03 ENCOUNTER — Other Ambulatory Visit (INDEPENDENT_AMBULATORY_CARE_PROVIDER_SITE_OTHER): Payer: Medicare Other

## 2017-09-03 DIAGNOSIS — E782 Mixed hyperlipidemia: Secondary | ICD-10-CM

## 2017-09-03 DIAGNOSIS — E1165 Type 2 diabetes mellitus with hyperglycemia: Secondary | ICD-10-CM | POA: Diagnosis not present

## 2017-09-03 LAB — COMPREHENSIVE METABOLIC PANEL
ALBUMIN: 4 g/dL (ref 3.5–5.2)
ALK PHOS: 113 U/L (ref 39–117)
ALT: 17 U/L (ref 0–35)
AST: 14 U/L (ref 0–37)
BUN: 7 mg/dL (ref 6–23)
CALCIUM: 9.4 mg/dL (ref 8.4–10.5)
CHLORIDE: 104 meq/L (ref 96–112)
CO2: 33 mEq/L — ABNORMAL HIGH (ref 19–32)
Creatinine, Ser: 0.71 mg/dL (ref 0.40–1.20)
GFR: 105.27 mL/min (ref >60.00)
Glucose, Bld: 172 mg/dL — ABNORMAL HIGH (ref 70–99)
POTASSIUM: 4.2 meq/L (ref 3.5–5.1)
SODIUM: 141 meq/L (ref 135–145)
TOTAL PROTEIN: 6.5 g/dL (ref 6.0–8.3)
Total Bilirubin: 0.7 mg/dL (ref 0.2–1.2)

## 2017-09-03 LAB — LIPID PANEL
CHOLESTEROL: 133 mg/dL (ref 0–200)
HDL: 48.8 mg/dL (ref >39.00)
LDL CALC: 58 mg/dL (ref 0–99)
NonHDL: 84.26
TRIGLYCERIDES: 129 mg/dL (ref 0.0–149.0)
Total CHOL/HDL Ratio: 3
VLDL: 25.8 mg/dL (ref 0.0–40.0)

## 2017-09-03 LAB — HEMOGLOBIN A1C: HEMOGLOBIN A1C: 8.3 % — AB (ref 4.6–6.5)

## 2017-09-08 DIAGNOSIS — G8929 Other chronic pain: Secondary | ICD-10-CM | POA: Diagnosis not present

## 2017-09-08 DIAGNOSIS — I890 Lymphedema, not elsewhere classified: Secondary | ICD-10-CM | POA: Diagnosis not present

## 2017-09-08 DIAGNOSIS — M25511 Pain in right shoulder: Secondary | ICD-10-CM | POA: Diagnosis not present

## 2017-09-09 ENCOUNTER — Encounter: Payer: Self-pay | Admitting: Family Medicine

## 2017-09-09 ENCOUNTER — Other Ambulatory Visit: Payer: Self-pay

## 2017-09-09 ENCOUNTER — Ambulatory Visit (INDEPENDENT_AMBULATORY_CARE_PROVIDER_SITE_OTHER): Payer: Medicare Other | Admitting: Family Medicine

## 2017-09-09 VITALS — BP 120/68 | HR 73 | Temp 97.6°F | Ht 66.25 in | Wt 216.2 lb

## 2017-09-09 DIAGNOSIS — I2510 Atherosclerotic heart disease of native coronary artery without angina pectoris: Secondary | ICD-10-CM | POA: Diagnosis not present

## 2017-09-09 DIAGNOSIS — E782 Mixed hyperlipidemia: Secondary | ICD-10-CM | POA: Diagnosis not present

## 2017-09-09 DIAGNOSIS — R8290 Unspecified abnormal findings in urine: Secondary | ICD-10-CM | POA: Diagnosis not present

## 2017-09-09 DIAGNOSIS — E1165 Type 2 diabetes mellitus with hyperglycemia: Secondary | ICD-10-CM | POA: Diagnosis not present

## 2017-09-09 DIAGNOSIS — Z23 Encounter for immunization: Secondary | ICD-10-CM | POA: Diagnosis not present

## 2017-09-09 DIAGNOSIS — B379 Candidiasis, unspecified: Secondary | ICD-10-CM

## 2017-09-09 DIAGNOSIS — I10 Essential (primary) hypertension: Secondary | ICD-10-CM | POA: Diagnosis not present

## 2017-09-09 LAB — POC URINALSYSI DIPSTICK (AUTOMATED)
BILIRUBIN UA: NEGATIVE
Glucose, UA: NEGATIVE
Ketones, UA: NEGATIVE
NITRITE UA: NEGATIVE
PH UA: 6 (ref 5.0–8.0)
RBC UA: NEGATIVE
Spec Grav, UA: >=1.03 — AB (ref 1.010–1.025)
Urobilinogen, UA: 0.2 E.U./dL

## 2017-09-09 LAB — HM DIABETES FOOT EXAM

## 2017-09-09 MED ORDER — NYSTATIN 100000 UNIT/GM EX CREA: 1.0000 "application " | TOPICAL_CREAM | Freq: Two times a day (BID) | CUTANEOUS | 1 refills | Status: DC

## 2017-09-09 NOTE — Assessment & Plan Note (Signed)
Well controlled. Continue current medication.  

## 2017-09-09 NOTE — Progress Notes (Signed)
Subjective:    Patient ID: Terri [name], female    DOB: Jan 08, 1951, 66 y.o.   MRN: [id number]  HPI    66 year old female presents for DM follow up.  Diabetes:   Inadequate control on metformrin max. Has diarrhea.. So decreases when needing to go out.  She is eating a lot of peaches in syrup.  Lab Results  Component Value Date   HGBA1C 8.3 (H) 09/03/2017  Using medications without difficulties: Hypoglycemic episodes: Hyperglycemic episodes: Feet problems: Blood Sugars averaging: eye exam within last year:  Hypertension:   Good control on ACei  and amlodipine BP Readings from Last 3 Encounters:  09/09/17 120/68  03/05/17 122/78  12/03/16 120/68  Using medication without problems or lightheadedness:  Chest pain with exertion: Edema: Short of breath: Average home BPs: Other issues:  Elevated Cholesterol:  LDL at goal on  40 mg atorvastatin. Lab Results  Component Value Date   CHOL 133 09/03/2017   HDL 48.80 09/03/2017   LDLCALC 58 09/03/2017   LDLDIRECT 63.3 11/22/2013   TRIG 129.0 09/03/2017   CHOLHDL 3 09/03/2017  Using medications without problems: Muscle aches:  Diet compliance: moderate Exercise:    Walking up stairs more. Other complaints:    Has red rash.. Likely candida.  She has odor of urine.. No burning, no lower abd pain. No vaginal itching.  Blood pressure 120/68, pulse 73, temperature 97.6 F (36.4 C), temperature source Oral, height 5' 6.25" (1.683 m), weight 216 lb 4 oz (98.1 kg).   Review of Systems  Constitutional: Negative for fatigue and fever.  HENT: Negative for congestion.   Eyes: Negative for pain.  Respiratory: Negative for cough and shortness of breath.   Cardiovascular: Negative for chest pain, palpitations and leg swelling.  Gastrointestinal: Negative for abdominal pain.  Genitourinary: Negative for dysuria and vaginal bleeding.  Musculoskeletal: Negative for back pain.  Neurological: Negative for syncope,  light-headedness and headaches.  Psychiatric/Behavioral: Negative for dysphoric mood.       Objective:   Physical Exam  Constitutional: Vital signs are normal. She appears well-developed and well-nourished. She is cooperative.  Non-toxic appearance. She does not appear ill. No distress.  obese  HENT:  Head: Normocephalic.  Right Ear: Hearing, tympanic membrane, external ear and ear canal normal. Tympanic membrane is not erythematous, not retracted and not bulging.  Left Ear: Hearing, tympanic membrane, external ear and ear canal normal. Tympanic membrane is not erythematous, not retracted and not bulging.  Nose: No mucosal edema or rhinorrhea. Right sinus exhibits no maxillary sinus tenderness and no frontal sinus tenderness. Left sinus exhibits no maxillary sinus tenderness and no frontal sinus tenderness.  Mouth/Throat: Uvula is midline, oropharynx is clear and moist and mucous membranes are normal.  Eyes: Conjunctivae, EOM and lids are normal. Pupils are equal, round, and reactive to light. Lids are everted and swept, no foreign bodies found.  Neck: Trachea normal and normal range of motion. Neck supple. Carotid bruit is not present. No thyroid mass and no thyromegaly present.  Cardiovascular: Normal rate, regular rhythm, S1 normal, S2 normal, normal heart sounds, intact distal pulses and normal pulses. Exam reveals no gallop and no friction rub.  No murmur heard. Pulmonary/Chest: Effort normal and breath sounds normal. No tachypnea. No respiratory distress. She has no decreased breath sounds. She has no wheezes. She has no rhonchi. She has no rales.  Abdominal: Soft. Normal appearance and bowel sounds are normal. There is no tenderness.  Neurological: She is alert.  Skin: Skin is warm, dry and intact. No rash noted.  Psychiatric: Her speech is normal and behavior is normal. Judgment and thought content normal. Her mood appears not anxious. Cognition and memory are normal. She does not  exhibit a depressed mood.   Diabetic foot exam: Normal inspection No skin breakdown No calluses  Normal DP pulses Normal sensation to light touch and monofilament Nails normal        Assessment & Plan:

## 2017-09-09 NOTE — Assessment & Plan Note (Signed)
Encouraged exercise, weight loss, healthy eating habits. ? ?

## 2017-09-09 NOTE — Assessment & Plan Note (Signed)
Worsened control.. Poor diet.. Make changes.  Consider glipizide at next OV if not improving. MAy need to decrease metformin due to diarrhea.  Encouraged exercise, weight loss, healthy eating habits.

## 2017-09-09 NOTE — Assessment & Plan Note (Signed)
Treat with topical nystatin. 

## 2017-09-09 NOTE — Patient Instructions (Addendum)
Stop peaches and jello.  Work on higher fiber and protein snack. Set up yearly eye exam.

## 2017-09-15 DIAGNOSIS — I890 Lymphedema, not elsewhere classified: Secondary | ICD-10-CM | POA: Diagnosis not present

## 2017-09-15 DIAGNOSIS — G8929 Other chronic pain: Secondary | ICD-10-CM | POA: Diagnosis not present

## 2017-09-15 DIAGNOSIS — M25511 Pain in right shoulder: Secondary | ICD-10-CM | POA: Diagnosis not present

## 2017-10-06 DIAGNOSIS — I890 Lymphedema, not elsewhere classified: Secondary | ICD-10-CM | POA: Diagnosis not present

## 2017-10-06 DIAGNOSIS — G8929 Other chronic pain: Secondary | ICD-10-CM | POA: Diagnosis not present

## 2017-10-06 DIAGNOSIS — M25511 Pain in right shoulder: Secondary | ICD-10-CM | POA: Diagnosis not present

## 2017-12-02 ENCOUNTER — Telehealth: Payer: Self-pay | Admitting: Family Medicine

## 2017-12-02 DIAGNOSIS — E1165 Type 2 diabetes mellitus with hyperglycemia: Secondary | ICD-10-CM

## 2017-12-02 NOTE — Telephone Encounter (Signed)
-----   Message from Alvina Chouerri J Walsh sent at 11/25/2017  3:36 PM EST ----- Regarding: Lab orders for Friday, 3.8.19 Lab orders for a 3 month follow up appt.

## 2017-12-03 ENCOUNTER — Other Ambulatory Visit (INDEPENDENT_AMBULATORY_CARE_PROVIDER_SITE_OTHER): Payer: Medicare Other

## 2017-12-03 DIAGNOSIS — E1165 Type 2 diabetes mellitus with hyperglycemia: Secondary | ICD-10-CM

## 2017-12-03 LAB — HEMOGLOBIN A1C: Hgb A1c MFr Bld: 8.3 % — ABNORMAL HIGH (ref 4.6–6.5)

## 2017-12-10 ENCOUNTER — Encounter: Payer: Self-pay | Admitting: Gastroenterology

## 2017-12-10 ENCOUNTER — Ambulatory Visit (INDEPENDENT_AMBULATORY_CARE_PROVIDER_SITE_OTHER): Payer: Medicare Other | Admitting: Family Medicine

## 2017-12-10 ENCOUNTER — Encounter: Payer: Self-pay | Admitting: Family Medicine

## 2017-12-10 VITALS — BP 122/84 | HR 71 | Temp 97.9°F | Wt 215.0 lb

## 2017-12-10 DIAGNOSIS — Z1211 Encounter for screening for malignant neoplasm of colon: Secondary | ICD-10-CM

## 2017-12-10 DIAGNOSIS — I10 Essential (primary) hypertension: Secondary | ICD-10-CM

## 2017-12-10 DIAGNOSIS — E1165 Type 2 diabetes mellitus with hyperglycemia: Secondary | ICD-10-CM

## 2017-12-10 MED ORDER — GLIPIZIDE ER 5 MG PO TB24: 5.0000 mg | ORAL_TABLET | Freq: Every day | ORAL | 11 refills | Status: DC

## 2017-12-10 NOTE — Progress Notes (Signed)
Subjective:    Patient ID: Terri [name], female    DOB: June 16, 1951, 67 y.o.   MRN: [id number]  HPI   67 year old female presents for follow up 3 months.  Diabetes:   Worsened control  She is unable to take 2000 mg daily... Causes diarrhea.  She is not taking bedtime metformin.  Lab Results  Component Value Date   HGBA1C 8.3 (H) 12/03/2017  Using medications without difficulties: Hypoglycemic episodes: Hyperglycemic episodes: Feet problems: no ulcers,  Has toenail fungus. Blood Sugars averaging: not checking eye exam within last year:    Wt Readings from Last 3 Encounters:  12/10/17 215 lb (97.5 kg)  09/09/17 216 lb 4 oz (98.1 kg)  03/05/17 220 lb 8 oz (100 kg)    Hypertension:    Good control on amlodipine, ACEI BP Readings from Last 3 Encounters:  12/10/17 122/84  09/09/17 120/68  03/05/17 122/78  Using medication without problems or lightheadedness:  none Chest pain with exertion: none Edema:none Short of breath: none Average home BPs: Other issues:  Blood pressure 122/84, pulse 71, temperature 97.9 F (36.6 C), temperature source Oral, weight 215 lb (97.5 kg), SpO2 98 %.   Review of Systems  Constitutional: Negative for fatigue and fever.  HENT: Negative for ear pain.   Eyes: Negative for pain.  Respiratory: Negative for chest tightness and shortness of breath.   Cardiovascular: Negative for chest pain, palpitations and leg swelling.  Gastrointestinal: Negative for abdominal pain.  Genitourinary: Negative for dysuria.       Objective:   Physical Exam  Constitutional: Vital signs are normal. She appears well-developed and well-nourished. She is cooperative.  Non-toxic appearance. She does not appear ill. No distress.  obesity  HENT:  Head: Normocephalic.  Right Ear: Hearing, tympanic membrane, external ear and ear canal normal. Tympanic membrane is not erythematous, not retracted and not bulging.  Left Ear: Hearing, tympanic membrane, external ear  and ear canal normal. Tympanic membrane is not erythematous, not retracted and not bulging.  Nose: No mucosal edema or rhinorrhea. Right sinus exhibits no maxillary sinus tenderness and no frontal sinus tenderness. Left sinus exhibits no maxillary sinus tenderness and no frontal sinus tenderness.  Mouth/Throat: Uvula is midline, oropharynx is clear and moist and mucous membranes are normal.  Eyes: Conjunctivae, EOM and lids are normal. Pupils are equal, round, and reactive to light. Lids are everted and swept, no foreign bodies found.  Neck: Trachea normal and normal range of motion. Neck supple. Carotid bruit is not present. No thyroid mass and no thyromegaly present.  Cardiovascular: Normal rate, regular rhythm, S1 normal, S2 normal, normal heart sounds, intact distal pulses and normal pulses. Exam reveals no gallop and no friction rub.  No murmur heard. Pulmonary/Chest: Effort normal and breath sounds normal. No tachypnea. No respiratory distress. She has no decreased breath sounds. She has no wheezes. She has no rhonchi. She has no rales.  Abdominal: Soft. Normal appearance and bowel sounds are normal. There is no tenderness.  Neurological: She is alert.  Skin: Skin is warm, dry and intact. No rash noted.  Psychiatric: Her speech is normal and behavior is normal. Judgment and thought content normal. Her mood appears not anxious. Cognition and memory are normal. She does not exhibit a depressed mood.    Diabetic foot exam: Normal inspection except 2 stable masses on sole of foot. No skin breakdown No calluses  Normal DP pulses Normal sensation to light touch and monofilament Nails  Discolored.. Discussed  treatment options.. She will try OTC topical       Assessment & Plan:

## 2017-12-10 NOTE — Assessment & Plan Note (Signed)
Decrease metformin to 2 a day and add glipizide.

## 2017-12-10 NOTE — Patient Instructions (Addendum)
Increase activity, walking. Keep working on healthy eating.  Stay on metformin 2 tabs daily.  Start glipizide in AM.  Follow blood sugars at home fasting,  email  or call with blood sugars in next 2-3 weeks. Please stop at the front desk to set up referral.

## 2017-12-10 NOTE — Assessment & Plan Note (Signed)
Well controlled. Continue current medication.  

## 2017-12-13 ENCOUNTER — Other Ambulatory Visit: Payer: Self-pay | Admitting: Family Medicine

## 2017-12-14 ENCOUNTER — Telehealth: Payer: Self-pay | Admitting: *Deleted

## 2017-12-14 ENCOUNTER — Other Ambulatory Visit: Payer: Self-pay | Admitting: *Deleted

## 2017-12-14 MED ORDER — NORTRIPTYLINE HCL 10 MG PO CAPS: 10.0000 mg | ORAL_CAPSULE | Freq: Every day | ORAL | 3 refills | Status: DC

## 2017-12-14 NOTE — Telephone Encounter (Signed)
Received fax from [facility] requesting PA for Nortriptyline.  PA forms completed and faxed back to [facility] at [phone].  Awaiting decision.  Can take up to 72 hours for a response.

## 2017-12-14 NOTE — Telephone Encounter (Signed)
Last office visit 12/10/2017.  Last refilled 07/19/2015 for #90 with 3 refills.  Ok to refill?

## 2017-12-15 NOTE — Telephone Encounter (Signed)
PA for Nortriptyline was denied.  Patient must have tried at least two preferred medications: duloxetine, pregabalin, gabapentin, venlafaxine or venlafaxine xr.

## 2017-12-16 MED ORDER — NORTRIPTYLINE HCL 10 MG PO CAPS: 10.0000 mg | ORAL_CAPSULE | Freq: Every day | ORAL | 11 refills | Status: DC

## 2017-12-16 NOTE — Telephone Encounter (Signed)
Mrs. Barrington EllisonStrong notified as instructed by telephone. . She takes the Nortiptyline for her headaches.  [facility]   I advised patient  [facility] to [facility].  She is agreeable with that plan.  [facility] [address] in [place].

## 2017-12-16 NOTE — Addendum Note (Signed)
Addended by: Damita LackLORING, Wayne Wicklund S on: 12/16/2017 02:23 PM   Modules accepted: Orders

## 2017-12-16 NOTE — Telephone Encounter (Signed)
Pt has been on this fo awhile.. Coverage must have changed.  Verify what dx that she is taking the medication for please..  Does she want to try different option or pay out of pocket?

## 2017-12-31 ENCOUNTER — Encounter: Payer: Self-pay | Admitting: Family Medicine

## 2018-01-25 DIAGNOSIS — Z853 Personal history of malignant neoplasm of breast: Secondary | ICD-10-CM | POA: Diagnosis not present

## 2018-01-25 DIAGNOSIS — D0511 Intraductal carcinoma in situ of right breast: Secondary | ICD-10-CM | POA: Diagnosis not present

## 2018-01-25 LAB — HM MAMMOGRAPHY

## 2018-01-26 ENCOUNTER — Encounter: Payer: Self-pay | Admitting: Gastroenterology

## 2018-01-26 ENCOUNTER — Ambulatory Visit (INDEPENDENT_AMBULATORY_CARE_PROVIDER_SITE_OTHER): Payer: Medicare Other | Admitting: Gastroenterology

## 2018-01-26 ENCOUNTER — Ambulatory Visit: Payer: Medicare Other | Admitting: Gastroenterology

## 2018-01-26 ENCOUNTER — Other Ambulatory Visit: Payer: Self-pay | Admitting: *Deleted

## 2018-01-26 VITALS — BP 122/70 | HR 72 | Ht 66.54 in | Wt 218.0 lb

## 2018-01-26 DIAGNOSIS — Z1211 Encounter for screening for malignant neoplasm of colon: Secondary | ICD-10-CM | POA: Diagnosis not present

## 2018-01-26 MED ORDER — NA SULFATE-K SULFATE-MG SULF 17.5-3.13-1.6 GM/177ML PO SOLN: 1.0000 | Freq: Once | ORAL | 0 refills | Status: AC

## 2018-01-26 NOTE — Patient Instructions (Signed)
If you are age 70 or older, your body mass index should be between 23-30. Your Body mass index is 34.62 kg/m. If this is out of the aforementioned range listed, please consider follow up with your Primary Care Provider.  If you are age 43 or younger, your body mass index should be between 19-25. Your Body mass index is 34.62 kg/m. If this is out of the aformentioned range listed, please consider follow up with your Primary Care Provider.   You have been scheduled for a colonoscopy. Please follow written instructions given to you at your visit today.  Please pick up your prep supplies at the pharmacy within the next 1-3 days. If you use inhalers (even only as needed), please bring them with you on the day of your procedure. Your physician has requested that you go to www.startemmi.com and enter the access code given to you at your visit today. This web site gives a general overview about your procedure. However, you should still follow specific instructions given to you by our office regarding your preparation for the procedure.  Thank you for choosing [facility]  Dr Amada Jupiter III

## 2018-01-26 NOTE — Progress Notes (Signed)
[facility] Consult Note:  History: Terri [name] 01/26/2018  Referring physician: Jinny Sanders, MD  Reason for consult/chief complaint: screening colonoscopy (No Problems or complaints)   Subjective  HPI:  This is a very pleasant 67 year old woman referred by primary care and accompanied by her husband.  She is here to see me to discuss colon cancer screening.  She and her husband report that her last colonoscopy was over a decade ago done at another clinic in [place], no records are available.  She denies change in bowel habits, chronic abdominal pain or rectal bleeding.  She denies dysphagia, odynophagia, early satiety, nausea, vomiting or weight loss.  I reviewed her most recent primary care visit.  I also reviewed her most recent oncology note from [facility], describing her diagnosis of ductal carcinoma in situ in 2017, treated with lumpectomy and radiation. Cinzia denies a family history of colorectal cancer. ROS:  Review of Systems  She denies chest pain dyspnea or dysuria Past Medical History: Past Medical History:  Diagnosis Date  . Abdominal mass   . Abnormal coagulation profile   . Arthritis   . CAD (coronary artery disease)   . Diabetes mellitus type II, uncontrolled ([place])   . GERD (gastroesophageal reflux disease)   . Hyperlipidemia   . Hypertension   . 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 Other postoperative infection   . S/P PICC central line placement 10/2008   line sepsis-right PICC line removed      Past Surgical History: Past Surgical History:  Procedure Laterality Date  . ABDOMINAL MASS RESECTION  07/2008   Mesentertic Mass resection  . BREAST SURGERY Right 01/27/2016   [facility]  . 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 Coronary artery disease Mother   .  Hypertension Mother   . Arthritis Mother   . Brain cancer Father 50       deceased  . Arthritis Father   . Diabetes Sister   . Diabetes Brother         X 2  . Coronary artery disease Brother   . Prostate cancer Brother   . Diabetes Brother   . Colon cancer Neg Hx   . Stomach cancer Neg Hx     Social History: Social History   Socioeconomic History  . Marital status: Married    Spouse name: Not on file  . Number of children: Not on file  . Years of education: Not on file  . Highest education level: Not on file  Occupational History  . Not on file  Social Needs  . Financial resource strain: Not on file  . Food insecurity:    Worry: Not on file    Inability: Not on file  . Transportation needs:    Medical: Not on file    Non-medical: Not on file  Tobacco Use  . Smoking status: Never Smoker  . Smokeless tobacco: Never Used  Substance and Sexual Activity  . Alcohol use: No    Alcohol/week: 0.0 oz  . Drug use: No  . Sexual activity: Yes  Lifestyle  . Physical activity:    Days per week: Not on file    Minutes per session: Not on file  . Stress: Not on file  Relationships  . Social connections:    Talks on phone: Not on file  Gets together: Not on file    Attends religious service: Not on file    Active member of club or organization: Not on file    Attends meetings of clubs or organizations: Not on file    Relationship status: Not on file  Other Topics Concern  . Not on file  Social History Narrative   Occupation:  retired      Married - two grown children      Never Smoked      Alcohol use-no             Allergies: Allergies  Allergen Reactions  . Metoclopramide     Other reaction(s): Unknown  . Morphine     Other reaction(s): Unknown    Outpatient Meds: Current Outpatient Medications  Medication Sig Dispense Refill  . amLODipine (NORVASC) 10 MG tablet TAKE 1 TABLET DAILY 90 tablet 1  . aspirin 81 MG tablet Take 81 mg by mouth daily.    Marland Kitchen  atorvastatin (LIPITOR) 40 MG tablet TAKE 1 TABLET DAILY 90 tablet 2  . benzonatate (TESSALON) 200 MG capsule Take 1 capsule (200 mg total) by mouth 3 (three) times daily as needed for cough. Swallow whole- do not bite pill 30 capsule 0  . Calcium Carbonate (CALCIUM 600 PO) Take 1 tablet by mouth daily.    . Cholecalciferol (VITAMIN D3 PO) Take 2,000 Units by mouth daily.    . ergocalciferol (VITAMIN D2) 50000 units capsule Take 50,000 Units by mouth once a week.    Marland Kitchen glipiZIDE (GLUCOTROL XL) 5 MG 24 hr tablet Take 1 tablet (5 mg total) by mouth daily with breakfast. 30 tablet 11  . glucose blood test strip 1 each by Other route 3 (three) times daily as needed for other. Use as instructed 100 each 3  . letrozole (FEMARA) 2.5 MG tablet Take 2.5 mg by mouth daily.    Marland Kitchen lisinopril (PRINIVIL,ZESTRIL) 20 MG tablet TAKE 1 TABLET DAILY 90 tablet 1  . Lysine HCl 500 MG TABS Take 1 tablet (500 mg total) by mouth daily. 90 tablet 1  . metFORMIN (GLUCOPHAGE-XR) 500 MG 24 hr tablet Take 1,000 mg by mouth daily with breakfast.    . nortriptyline (PAMELOR) 10 MG capsule Take 1 capsule (10 mg total) by mouth at bedtime. 30 capsule 11  . nystatin cream (MYCOSTATIN) Apply 1 application topically 2 (two) times daily. 30 g 1  . pantoprazole (PROTONIX) 40 MG tablet TAKE 1 TABLET DAILY 90 tablet 1  . Na Sulfate-K Sulfate-Mg Sulf 17.5-3.13-1.6 GM/177ML SOLN Take 1 kit by mouth once for 1 dose. 354 mL 0   No current facility-administered medications for this visit.       ___________________________________________________________________ Objective   Exam:  BP 122/70   Pulse 72   Ht 5' 6.54" (1.69 m)   Wt 218 lb (98.9 kg)   BMI 34.62 kg/m    General: this is a(n) well-appearing woman  Eyes: sclera anicteric, no redness  ENT: oral mucosa moist without lesions, no cervical or supraclavicular lymphadenopathy, good dentition  CV: RRR without murmur, S1/S2, no JVD, no peripheral edema  Resp: clear to  auscultation bilaterally, normal RR and effort noted  GI: soft, no tenderness, with active bowel sounds. No guarding or palpable organomegaly noted.  Long midline scar  Skin; warm and dry, no rash or jaundice noted  Neuro: awake, alert and oriented x 3. Normal gross motor function and fluent speech  Labs:  CBC Latest Ref Rng & Units 08/14/2014 11/22/2012  01/20/2011  WBC 4.0 - 10.5 K/uL 5.2 4.5 5.3  Hemoglobin 12.0 - 15.0 g/dL 14.1 14.6 13.9  Hematocrit 36.0 - 46.0 % 43.4 44.1 41.4  Platelets 150.0 - 400.0 K/uL 296.0 249.0 227.0     Assessment: Encounter Diagnosis  Name Primary?  . Special screening for malignant neoplasms, colon Yes    She is appropriate for screening colonoscopy, and is agreeable after discussion of procedure and risks.  Total 20-minute time, all of which spent face-to-face with patient and husband discussing her history and reviewing plans for colonoscopy.  Thank you for the courtesy of this consult.  Please call me with any questions or concerns.  Nelida Meuse III  CC: Jinny Sanders, MD

## 2018-02-01 ENCOUNTER — Encounter: Payer: Self-pay | Admitting: Gastroenterology

## 2018-02-01 ENCOUNTER — Other Ambulatory Visit: Payer: Self-pay

## 2018-02-01 ENCOUNTER — Ambulatory Visit (AMBULATORY_SURGERY_CENTER): Payer: Medicare Other | Admitting: Gastroenterology

## 2018-02-01 VITALS — BP 148/75 | HR 81 | Temp 97.8°F | Resp 19 | Ht 66.0 in | Wt 218.0 lb

## 2018-02-01 DIAGNOSIS — I2510 Atherosclerotic heart disease of native coronary artery without angina pectoris: Secondary | ICD-10-CM | POA: Diagnosis not present

## 2018-02-01 DIAGNOSIS — Z1211 Encounter for screening for malignant neoplasm of colon: Secondary | ICD-10-CM | POA: Diagnosis not present

## 2018-02-01 DIAGNOSIS — I10 Essential (primary) hypertension: Secondary | ICD-10-CM | POA: Diagnosis not present

## 2018-02-01 DIAGNOSIS — E119 Type 2 diabetes mellitus without complications: Secondary | ICD-10-CM | POA: Diagnosis not present

## 2018-02-01 DIAGNOSIS — E669 Obesity, unspecified: Secondary | ICD-10-CM | POA: Diagnosis not present

## 2018-02-01 DIAGNOSIS — Z1212 Encounter for screening for malignant neoplasm of rectum: Secondary | ICD-10-CM

## 2018-02-01 MED ORDER — SODIUM CHLORIDE 0.9 % IV SOLN: 500.0000 mL | Freq: Once | INTRAVENOUS | Status: DC

## 2018-02-01 NOTE — Op Note (Signed)
[facility] Patient Name: Terri [name] Procedure Date: 02/01/2018 10:05 AM MRN: [id number] Endoscopist: Sherilyn Cooter L. Myrtie Neither , MD Age: 67 Referring MD:  Date of Birth: 30-Oct-1950 Gender: Female Account #: [id number] Procedure:                Colonoscopy Indications:              Screening for colorectal malignant neoplasm Medicines:                Monitored Anesthesia Care Procedure:                Pre-Anesthesia Assessment:                           - Prior to the procedure, a History and Physical                            was performed, and patient medications and                            allergies were reviewed. The patient's tolerance of                            previous anesthesia was also reviewed. The risks                            and benefits of the procedure and the sedation                            options and risks were discussed with the patient.                            All questions were answered, and informed consent                            was obtained. Prior Anticoagulants: The patient has                            taken no previous anticoagulant or antiplatelet                            agents. ASA Grade Assessment: III - A patient with                            severe systemic disease. After reviewing the risks                            and benefits, the patient was deemed in                            satisfactory condition to undergo the procedure.                           After obtaining informed consent, the colonoscope  was passed under direct vision. Throughout the                            procedure, the patient's blood pressure, pulse, and                            oxygen saturations were monitored continuously. The                            Colonoscope was introduced through the anus and                            advanced to the the cecum, identified by                            appendiceal orifice and  ileocecal valve. The                            colonoscopy was performed without difficulty. The                            patient tolerated the procedure well. The quality                            of the bowel preparation was good. The ileocecal                            valve, appendiceal orifice, and rectum were                            photographed. The quality of the bowel preparation                            was evaluated using the BBPS [facility] Bowel                            Preparation Scale) with scores of: Right Colon = 2,                            Transverse Colon = 2 and Left Colon = 2. The total                            BBPS score equals 6. The bowel preparation used was                            SUPREP. Scope In: 10:10:43 AM Scope Out: 10:25:16 AM Scope Withdrawal Time: 0 hours 9 minutes 25 seconds  Total Procedure Duration: 0 hours 14 minutes 33 seconds  Findings:                 The perianal and digital rectal examinations were                            normal.  The entire examined colon appeared normal on direct                            and retroflexion views. Complications:            No immediate complications. Estimated Blood Loss:     Estimated blood loss: none. Impression:               - The entire examined colon is normal on direct and                            retroflexion views.                           - No specimens collected. Recommendation:           - Patient has a contact number available for                            emergencies. The signs and symptoms of potential                            delayed complications were discussed with the                            patient. Return to normal activities tomorrow.                            Written discharge instructions were provided to the                            patient.                           - Resume previous diet.                           - Continue  present medications.                           - Repeat colonoscopy in 10 years for screening                            purposes. Zaidee Rion L. Myrtie Neither, MD 02/01/2018 10:27:51 AM This report has been signed electronically.

## 2018-02-01 NOTE — Progress Notes (Signed)
To recovery, report to RN, VSS. 

## 2018-02-01 NOTE — Patient Instructions (Signed)
THANK YOU FOR ALLOWING US TO CARE FOR YOU TODAY!     YOU HAD AN ENDOSCOPIC PROCEDURE TODAY AT THE [facility]:   Refer to the procedure report that was given to you for any specific questions about what was found during the examination.  If the procedure report does not answer your questions, please call your gastroenterologist to clarify.  If you requested that your care partner not be given the details of your procedure findings, then the procedure report has been included in a sealed envelope for you to review at your convenience later.  YOU SHOULD EXPECT: Some feelings of bloating in the abdomen. Passage of more gas than usual.  Walking can help get rid of the air that was put into your GI tract during the procedure and reduce the bloating. If you had a lower endoscopy (such as a colonoscopy or flexible sigmoidoscopy) you may notice spotting of blood in your stool or on the toilet paper. If you underwent a bowel prep for your procedure, you may not have a normal bowel movement for a few days.  Please Note:  You might notice some irritation and congestion in your nose or some drainage.  This is from the oxygen used during your procedure.  There is no need for concern and it should clear up in a day or so.  SYMPTOMS TO REPORT IMMEDIATELY:   Following lower endoscopy (colonoscopy or flexible sigmoidoscopy):  Excessive amounts of blood in the stool  Significant tenderness or worsening of abdominal pains  Swelling of the abdomen that is new, acute  Fever of 100F or higher   For urgent or emergent issues, a gastroenterologist can be reached at any hour by calling [phone].   DIET:  We do recommend a small meal at first, but then you may proceed to your regular diet.  Drink plenty of fluids but you should avoid alcoholic beverages for 24 hours.  ACTIVITY:  You should plan to take it easy for the rest of today and you should NOT DRIVE or use heavy machinery until tomorrow  (because of the sedation medicines used during the test).    FOLLOW UP: Our staff will call the number listed on your records the next business day following your procedure to check on you and address any questions or concerns that you may have regarding the information given to you following your procedure. If we do not reach you, we will leave a message.  However, if you are feeling well and you are not experiencing any problems, there is no need to return our call.  We will assume that you have returned to your regular daily activities without incident.  If any biopsies were taken you will be contacted by phone or by letter within the next 1-3 weeks.  Please call us at [phone] if you have not heard about the biopsies in 3 weeks.    SIGNATURES/CONFIDENTIALITY: You and/or your care partner have signed paperwork which will be entered into your electronic medical record.  These signatures attest to the fact that that the information above on your After Visit Summary has been reviewed and is understood.  Full responsibility of the confidentiality of this discharge information lies with you and/or your care-partner. 

## 2018-02-01 NOTE — Progress Notes (Signed)
Pt's states no medical or surgical changes since previsit or office visit. 

## 2018-02-02 ENCOUNTER — Telehealth: Payer: Self-pay | Admitting: *Deleted

## 2018-02-02 NOTE — Telephone Encounter (Signed)
  Follow up Call-  Call back number 02/01/2018  Post procedure Call Back phone  # [phone]  Permission to leave phone message Yes  Some recent data might be hidden     Patient questions:  Do you have a fever, pain , or abdominal swelling? No. Pain Score  0 *  Have you tolerated food without any problems? Yes.    Have you been able to return to your normal activities? Yes.    Do you have any questions about your discharge instructions: Diet   No. Medications  No. Follow up visit  No.  Do you have questions or concerns about your Care? No.  Actions: * If pain score is 4 or above: No action needed, pain <4.

## 2018-02-15 ENCOUNTER — Other Ambulatory Visit: Payer: Self-pay | Admitting: Family Medicine

## 2018-02-15 DIAGNOSIS — K219 Gastro-esophageal reflux disease without esophagitis: Secondary | ICD-10-CM

## 2018-03-11 ENCOUNTER — Telehealth: Payer: Self-pay | Admitting: Family Medicine

## 2018-03-11 ENCOUNTER — Other Ambulatory Visit (INDEPENDENT_AMBULATORY_CARE_PROVIDER_SITE_OTHER): Payer: Medicare Other

## 2018-03-11 ENCOUNTER — Other Ambulatory Visit: Payer: Self-pay | Admitting: Family Medicine

## 2018-03-11 DIAGNOSIS — E1165 Type 2 diabetes mellitus with hyperglycemia: Secondary | ICD-10-CM

## 2018-03-11 LAB — COMPREHENSIVE METABOLIC PANEL
ALK PHOS: 128 U/L — AB (ref 39–117)
ALT: 18 U/L (ref 0–35)
AST: 14 U/L (ref 0–37)
Albumin: 4.3 g/dL (ref 3.5–5.2)
BUN: 10 mg/dL (ref 6–23)
CO2: 32 mEq/L (ref 19–32)
Calcium: 10 mg/dL (ref 8.4–10.5)
Chloride: 102 mEq/L (ref 96–112)
Creatinine, Ser: 0.69 mg/dL (ref 0.40–1.20)
GFR: 108.63 mL/min (ref >60.00)
GLUCOSE: 144 mg/dL — AB (ref 70–99)
POTASSIUM: 4.6 meq/L (ref 3.5–5.1)
Sodium: 141 mEq/L (ref 135–145)
TOTAL PROTEIN: 7 g/dL (ref 6.0–8.3)
Total Bilirubin: 0.5 mg/dL (ref 0.2–1.2)

## 2018-03-11 LAB — LIPID PANEL
CHOLESTEROL: 182 mg/dL (ref 0–200)
HDL: 54.9 mg/dL (ref >39.00)
LDL Cholesterol: 96 mg/dL (ref 0–99)
NONHDL: 126.78
Total CHOL/HDL Ratio: 3
Triglycerides: 155 mg/dL — ABNORMAL HIGH (ref 0.0–149.0)
VLDL: 31 mg/dL (ref 0.0–40.0)

## 2018-03-11 LAB — HEMOGLOBIN A1C: Hgb A1c MFr Bld: 7.4 % — ABNORMAL HIGH (ref 4.6–6.5)

## 2018-03-11 NOTE — Telephone Encounter (Signed)
-----   Message from Wendi MayaLauren Greeson, RT sent at 03/10/2018  2:18 PM EDT ----- Regarding: Lab orders for tomorrow 03/11/18 Please enter medicare CPX lab orders for tomorrow. Thanks-Lauren

## 2018-03-14 ENCOUNTER — Ambulatory Visit: Payer: Medicare Other

## 2018-03-25 ENCOUNTER — Ambulatory Visit (INDEPENDENT_AMBULATORY_CARE_PROVIDER_SITE_OTHER): Payer: Medicare Other | Admitting: Family Medicine

## 2018-03-25 ENCOUNTER — Encounter: Payer: Self-pay | Admitting: Family Medicine

## 2018-03-25 VITALS — BP 132/62 | HR 72 | Temp 97.8°F | Ht 66.5 in | Wt 225.2 lb

## 2018-03-25 DIAGNOSIS — E782 Mixed hyperlipidemia: Secondary | ICD-10-CM

## 2018-03-25 DIAGNOSIS — I10 Essential (primary) hypertension: Secondary | ICD-10-CM | POA: Diagnosis not present

## 2018-03-25 DIAGNOSIS — E1165 Type 2 diabetes mellitus with hyperglycemia: Secondary | ICD-10-CM | POA: Diagnosis not present

## 2018-03-25 DIAGNOSIS — Z0000 Encounter for general adult medical examination without abnormal findings: Secondary | ICD-10-CM | POA: Diagnosis not present

## 2018-03-25 LAB — HM DIABETES FOOT EXAM

## 2018-03-25 MED ORDER — GLIPIZIDE ER 5 MG PO TB24: 5.0000 mg | ORAL_TABLET | Freq: Every day | ORAL | 3 refills | Status: DC

## 2018-03-25 NOTE — Progress Notes (Signed)
Subjective:    Patient ID: Terri [name], female    DOB: Jul 18, 1951, 67 y.o.   MRN: [id number]  HPI   I have personally reviewed the Medicare Annual Wellness questionnaire and have noted 1. The patient's medical and social history 2. Their use of alcohol, tobacco or illicit drugs 3. Their current medications and supplements 4. The patient's functional ability including ADL's, fall risks, home safety risks and hearing or visual             impairment. 5. Diet and physical activities 6. Evidence for depression or mood disorders 7.         Updated provider list Cognitive evaluation was performed and recorded on pt medicare questionnaire form. The patients weight, height, BMI and visual acuity have been recorded in the chart  I have made referrals, counseling and provided education to the patient based review of the above and I have provided the pt with a written personalized care plan for preventive services.   Documentation of this information was scanned into the electronic record under the media tab.  Hypertension:    Good control on amlodipine and lisinopril. BP Readings from Last 3 Encounters:  03/25/18 132/62  02/01/18 (!) 148/75  01/26/18 122/70   Using medication without problems or lightheadedness:  none Chest pain with exertion: none Edema:none Short of breath: none Average home BPs:  Not checking Other issues:  Diabetes:  Improving control. Compliant with medication. Trying to do low carb diet. Lab Results  Component Value Date   HGBA1C 7.4 (H) 03/11/2018  Using medications without difficulties: Hypoglycemic episodes: none Hyperglycemic episodes: rare after snack Feet problems: none Blood Sugars averaging: FBS 132, 2 hours post prandially 152-222  Walking  eye exam within last year: yes     Elevated Cholesterol: Good control on atorvastatin 40 mg daily Lab Results  Component Value Date   CHOL 182 03/11/2018   HDL 54.90 03/11/2018   LDLCALC 96  03/11/2018   LDLDIRECT 63.3 11/22/2013   TRIG 155.0 (H) 03/11/2018   CHOLHDL 3 03/11/2018  Using medications without problems: None Muscle aches:  none  CAD, MI in 2008, medically managed, on ASA 81 mg daily.  Not followed by cardiology, asymptomatic.  Breast cancer, active on femara, [facility]  On 2nd year of 5 year femara. Has SE of hot flashes and hair loss. She has joint pain associated with femara.. Her husbands  excedrine tramadol helps  GERD, well controlled  Using as needed pantoprazole.   Advance directives and end of life planning reviewed in detail with patient and documented in EMR. Patient given handout on advance care directives if needed. HCPOA and living will updated if needed.  Hearing Screening   Method: Audiometry   125Hz  250Hz  500Hz  1000Hz  2000Hz  3000Hz  4000Hz  6000Hz  8000Hz   Right ear:   20 20 20  20     Left ear:   20 20 20  20       Visual Acuity Screening   Right eye Left eye Both eyes  Without correction:     With correction: 20/20 20/20 20/20    Fall Risk  03/25/2018 11/30/2016 11/22/2015 08/27/2014  Falls in the past year? No No No No   [facility] 03/25/2018 11/30/2016 11/22/2015  Decreased Interest 0 0 0  Down, Depressed, Hopeless 0 0 0  PHQ - 2 Score 0 0 0   Social History /Family History/Past Medical History reviewed in detail and updated in EMR if needed. Blood pressure 132/62, pulse  72, temperature 97.8 F (36.6 C), temperature source Oral, height 5' 6.5" (1.689 m), weight 225 lb 4 oz (102.2 kg).   Review of Systems  Constitutional: Negative for fatigue and fever.  HENT: Negative for congestion.   Eyes: Negative for pain.  Respiratory: Negative for cough and shortness of breath.   Cardiovascular: Negative for chest pain, palpitations and leg swelling.  Gastrointestinal: Negative for abdominal pain.  Genitourinary: Negative for dysuria and vaginal bleeding.  Musculoskeletal: Positive for arthralgias. Negative for back pain.    Neurological: Negative for syncope, light-headedness and headaches.  Psychiatric/Behavioral: Negative for dysphoric mood.       Objective:   Physical Exam  Constitutional: Vital signs are normal. She appears well-developed and well-nourished. She is cooperative.  Non-toxic appearance. She does not appear ill. No distress.  HENT:  Head: Normocephalic.  Right Ear: Hearing, tympanic membrane, external ear and ear canal normal.  Left Ear: Hearing, tympanic membrane, external ear and ear canal normal.  Nose: Nose normal.  Eyes: Pupils are equal, round, and reactive to light. Conjunctivae, EOM and lids are normal. Lids are everted and swept, no foreign bodies found.  Neck: Trachea normal and normal range of motion. Neck supple. Carotid bruit is not present. No thyroid mass and no thyromegaly present.  Cardiovascular: Normal rate, regular rhythm, S1 normal, S2 normal, normal heart sounds and intact distal pulses. Exam reveals no gallop.  No murmur heard. Pulmonary/Chest: Effort normal and breath sounds normal. No respiratory distress. She has no wheezes. She has no rhonchi. She has no rales.  Abdominal: Soft. Normal appearance and bowel sounds are normal. She exhibits no distension, no fluid wave, no abdominal bruit and no mass. There is no hepatosplenomegaly. There is no tenderness. There is no rebound, no guarding and no CVA tenderness. No hernia.  Lymphadenopathy:    She has no cervical adenopathy.    She has no axillary adenopathy.  Neurological: She is alert. She has normal strength. No cranial nerve deficit or sensory deficit.  Skin: Skin is warm, dry and intact. No rash noted.  Psychiatric: Her speech is normal and behavior is normal. Judgment normal. Her mood appears not anxious. Cognition and memory are normal. She does not exhibit a depressed mood.      Diabetic foot exam: Normal inspection No skin breakdown No calluses  Normal DP pulses Normal sensation to light touch and  monofilament Nails normal     Assessment & Plan:  The patient's preventative maintenance and recommended screening tests for an annual wellness exam were reviewed in full today. Brought up to date unless services declined.  Counselled on the importance of diet, exercise, and its role in overall health and mortality. The patient's FH and SH was reviewed, including their home life, tobacco status, and drug and alcohol status.  Vaccines: uptodate Pap/DVE:   S/P TAH.  Mammo:  Stable April 2019 Bone Density: 2017 normal Colon:  01/2018 no polyps ,repeat in 10- year, Dr. Myrtie Neither Smoking Status: none ETOH/ drug use: none/none Hep C:  done

## 2018-03-25 NOTE — Patient Instructions (Addendum)
Make sure to keep up yearly eye exam.  Continue current medication doses.  Work on increasing exercise an low carb diet.

## 2018-03-25 NOTE — Assessment & Plan Note (Signed)
Improving control with addition of glucotrol.

## 2018-03-25 NOTE — Assessment & Plan Note (Signed)
Good control on atorvastatin. 

## 2018-03-25 NOTE — Assessment & Plan Note (Signed)
Well controlled. Continue current medication.  

## 2018-05-04 DIAGNOSIS — Z79811 Long term (current) use of aromatase inhibitors: Secondary | ICD-10-CM | POA: Diagnosis not present

## 2018-05-04 DIAGNOSIS — Z9189 Other specified personal risk factors, not elsewhere classified: Secondary | ICD-10-CM | POA: Diagnosis not present

## 2018-05-04 DIAGNOSIS — E559 Vitamin D deficiency, unspecified: Secondary | ICD-10-CM | POA: Diagnosis not present

## 2018-05-04 DIAGNOSIS — D0511 Intraductal carcinoma in situ of right breast: Secondary | ICD-10-CM | POA: Diagnosis not present

## 2018-06-11 ENCOUNTER — Telehealth: Payer: Self-pay | Admitting: Family Medicine

## 2018-06-11 DIAGNOSIS — E1165 Type 2 diabetes mellitus with hyperglycemia: Secondary | ICD-10-CM

## 2018-06-11 NOTE — Telephone Encounter (Signed)
-----   Message from Wendi MayaLauren Greeson, RT sent at 06/06/2018  1:45 PM EDT ----- Regarding: Lab orders for Friday 06/17/18 Please enter 3 month follow up lab orders for 06/17/18. Thanks!

## 2018-06-17 ENCOUNTER — Other Ambulatory Visit: Payer: Medicare Other

## 2018-06-23 DIAGNOSIS — Z79811 Long term (current) use of aromatase inhibitors: Secondary | ICD-10-CM | POA: Diagnosis not present

## 2018-06-23 DIAGNOSIS — Z9189 Other specified personal risk factors, not elsewhere classified: Secondary | ICD-10-CM | POA: Diagnosis not present

## 2018-06-24 ENCOUNTER — Ambulatory Visit: Payer: Medicare Other | Admitting: Family Medicine

## 2018-07-20 ENCOUNTER — Other Ambulatory Visit (INDEPENDENT_AMBULATORY_CARE_PROVIDER_SITE_OTHER): Payer: Medicare Other

## 2018-07-20 DIAGNOSIS — E1165 Type 2 diabetes mellitus with hyperglycemia: Secondary | ICD-10-CM | POA: Diagnosis not present

## 2018-07-20 LAB — HEMOGLOBIN A1C: Hgb A1c MFr Bld: 7.2 % — ABNORMAL HIGH (ref 4.6–6.5)

## 2018-07-26 ENCOUNTER — Ambulatory Visit (INDEPENDENT_AMBULATORY_CARE_PROVIDER_SITE_OTHER): Payer: Medicare Other | Admitting: Family Medicine

## 2018-07-26 ENCOUNTER — Encounter: Payer: Self-pay | Admitting: Family Medicine

## 2018-07-26 VITALS — BP 110/70 | HR 84 | Temp 98.4°F | Ht 66.5 in | Wt 228.5 lb

## 2018-07-26 DIAGNOSIS — Z23 Encounter for immunization: Secondary | ICD-10-CM | POA: Diagnosis not present

## 2018-07-26 DIAGNOSIS — I10 Essential (primary) hypertension: Secondary | ICD-10-CM

## 2018-07-26 DIAGNOSIS — K219 Gastro-esophageal reflux disease without esophagitis: Secondary | ICD-10-CM | POA: Diagnosis not present

## 2018-07-26 DIAGNOSIS — E1165 Type 2 diabetes mellitus with hyperglycemia: Secondary | ICD-10-CM | POA: Diagnosis not present

## 2018-07-26 LAB — HM DIABETES FOOT EXAM

## 2018-07-26 MED ORDER — ATORVASTATIN CALCIUM 40 MG PO TABS: 40.0000 mg | ORAL_TABLET | Freq: Every day | ORAL | 2 refills | Status: DC

## 2018-07-26 MED ORDER — GLIPIZIDE ER 10 MG PO TB24: 10.0000 mg | ORAL_TABLET | Freq: Every day | ORAL | 3 refills | Status: DC

## 2018-07-26 MED ORDER — PANTOPRAZOLE SODIUM 40 MG PO TBEC: 40.0000 mg | DELAYED_RELEASE_TABLET | Freq: Every day | ORAL | 3 refills | Status: DC

## 2018-07-26 NOTE — Assessment & Plan Note (Signed)
Increase glucotrol XL to 10 mg daily. Continue working on weight loss, exercise and low carb diet.

## 2018-07-26 NOTE — Progress Notes (Signed)
   Subjective:    Patient ID: Terri [name], female    DOB: [birth year], 67 y.o.   MRN: [id number]  HPI  67 year old female presents for 3 month follow up DM  Diabetes:  Improving control with addition of glucotrol but not at goal < 7. Using medications without difficulties: Hypoglycemic episodes: none Hyperglycemic episodes:none Feet problems: no ulcer Blood Sugars averaging:  Not checking eye exam within last year: due   Wt Readings from Last 3 Encounters:  07/26/18 228 lb 8 oz (103.6 kg)  03/25/18 225 lb 4 oz (102.2 kg)  02/01/18 218 lb (98.9 kg)    Walking daily.  Recommended flu vaccine.  Social History /Family History/Past Medical History reviewed in detail and updated in EMR if needed. Blood pressure 110/70, pulse 84, temperature 98.4 F (36.9 C), temperature source Oral, height 5' 6.5" (1.689 m), weight 228 lb 8 oz (103.6 kg).     Review of Systems  Constitutional: Negative for fatigue and fever.  HENT: Negative for congestion.   Eyes: Negative for pain.  Respiratory: Negative for cough and shortness of breath.   Cardiovascular: Negative for chest pain, palpitations and leg swelling.  Gastrointestinal: Negative for abdominal pain.  Genitourinary: Negative for dysuria and vaginal bleeding.  Musculoskeletal: Negative for back pain.  Neurological: Negative for syncope, light-headedness and headaches.  Psychiatric/Behavioral: Negative for dysphoric mood.       Objective:   Physical Exam  Constitutional: Vital signs are normal. She appears well-developed and well-nourished. She is cooperative.  Non-toxic appearance. She does not appear ill. No distress.  HENT:  Head: Normocephalic.  Right Ear: Hearing, tympanic membrane, external ear and ear canal normal. Tympanic membrane is not erythematous, not retracted and not bulging.  Left Ear: Hearing, tympanic membrane, external ear and ear canal normal. Tympanic membrane is not erythematous, not retracted and not  bulging.  Nose: No mucosal edema or rhinorrhea. Right sinus exhibits no maxillary sinus tenderness and no frontal sinus tenderness. Left sinus exhibits no maxillary sinus tenderness and no frontal sinus tenderness.  Mouth/Throat: Uvula is midline, oropharynx is clear and moist and mucous membranes are normal.  Eyes: Pupils are equal, round, and reactive to light. Conjunctivae, EOM and lids are normal. Lids are everted and swept, no foreign bodies found.  Neck: Trachea normal and normal range of motion. Neck supple. Carotid bruit is not present. No thyroid mass and no thyromegaly present.  Cardiovascular: Normal rate, regular rhythm, S1 normal, S2 normal, normal heart sounds, intact distal pulses and normal pulses. Exam reveals no gallop and no friction rub.  No murmur heard. Pulmonary/Chest: Effort normal and breath sounds normal. No tachypnea. No respiratory distress. She has no decreased breath sounds. She has no wheezes. She has no rhonchi. She has no rales.  Abdominal: Soft. Normal appearance and bowel sounds are normal. There is no tenderness.  Neurological: She is alert.  Skin: Skin is warm, dry and intact. No rash noted.  Psychiatric: Her speech is normal and behavior is normal. Judgment and thought content normal. Her mood appears not anxious. Cognition and memory are normal. She does not exhibit a depressed mood.     Diabetic foot exam: Normal inspection No skin breakdown No calluses  Normal DP pulses Normal sensation to light touch and monofilament Nails normal      Assessment & Plan:

## 2018-07-26 NOTE — Assessment & Plan Note (Signed)
Well controlled. Continue current medication.  

## 2018-07-26 NOTE — Patient Instructions (Addendum)
Increase glucotrol to Xl to 10 mg daily.  Follow fasting blood sugar at home.  Call if dropping low < 60, we can always decrease back to previous dose.  Goal fasting is 80-100, 2 hours after meals < 180.  Get eye exam.

## 2018-08-18 DIAGNOSIS — Z853 Personal history of malignant neoplasm of breast: Secondary | ICD-10-CM | POA: Diagnosis not present

## 2018-08-18 DIAGNOSIS — D0511 Intraductal carcinoma in situ of right breast: Secondary | ICD-10-CM | POA: Diagnosis not present

## 2018-08-18 DIAGNOSIS — Z08 Encounter for follow-up examination after completed treatment for malignant neoplasm: Secondary | ICD-10-CM | POA: Diagnosis not present

## 2018-08-22 ENCOUNTER — Other Ambulatory Visit: Payer: Self-pay | Admitting: Family Medicine

## 2018-10-25 ENCOUNTER — Telehealth: Payer: Self-pay | Admitting: Family Medicine

## 2018-10-25 ENCOUNTER — Other Ambulatory Visit: Payer: Medicare Other

## 2018-10-25 DIAGNOSIS — E1165 Type 2 diabetes mellitus with hyperglycemia: Secondary | ICD-10-CM

## 2018-10-25 NOTE — Telephone Encounter (Signed)
-----   Message from Wendi Maya, RT sent at 10/17/2018  9:14 AM EST ----- Regarding: Lab orders for Tuesday 10/25/18 Please enter 68month follow up labs orders for 10/25/18. Thanks!

## 2018-10-28 ENCOUNTER — Ambulatory Visit: Payer: Medicare Other | Admitting: Family Medicine

## 2018-11-22 ENCOUNTER — Other Ambulatory Visit (INDEPENDENT_AMBULATORY_CARE_PROVIDER_SITE_OTHER): Payer: Medicare Other

## 2018-11-22 DIAGNOSIS — E1165 Type 2 diabetes mellitus with hyperglycemia: Secondary | ICD-10-CM

## 2018-11-22 LAB — COMPREHENSIVE METABOLIC PANEL
ALT: 13 U/L (ref 0–35)
AST: 13 U/L (ref 0–37)
Albumin: 3.9 g/dL (ref 3.5–5.2)
Alkaline Phosphatase: 113 U/L (ref 39–117)
BUN: 8 mg/dL (ref 6–23)
CO2: 32 mEq/L (ref 19–32)
Calcium: 9.4 mg/dL (ref 8.4–10.5)
Chloride: 105 mEq/L (ref 96–112)
Creatinine, Ser: 0.74 mg/dL (ref 0.40–1.20)
GFR: 94.08 mL/min (ref >60.00)
Glucose, Bld: 105 mg/dL — ABNORMAL HIGH (ref 70–99)
POTASSIUM: 4.1 meq/L (ref 3.5–5.1)
Sodium: 143 mEq/L (ref 135–145)
Total Bilirubin: 0.7 mg/dL (ref 0.2–1.2)
Total Protein: 6.7 g/dL (ref 6.0–8.3)

## 2018-11-22 LAB — LIPID PANEL
Cholesterol: 115 mg/dL (ref 0–200)
HDL: 47.7 mg/dL (ref >39.00)
LDL Cholesterol: 51 mg/dL (ref 0–99)
NonHDL: 67.75
TRIGLYCERIDES: 82 mg/dL (ref 0.0–149.0)
Total CHOL/HDL Ratio: 2
VLDL: 16.4 mg/dL (ref 0.0–40.0)

## 2018-11-22 LAB — HEMOGLOBIN A1C: Hgb A1c MFr Bld: 7.1 % — ABNORMAL HIGH (ref 4.6–6.5)

## 2018-11-25 ENCOUNTER — Ambulatory Visit (INDEPENDENT_AMBULATORY_CARE_PROVIDER_SITE_OTHER): Payer: Medicare Other | Admitting: Family Medicine

## 2018-11-25 ENCOUNTER — Encounter: Payer: Self-pay | Admitting: Family Medicine

## 2018-11-25 VITALS — BP 120/60 | HR 78 | Temp 97.8°F | Ht 66.5 in | Wt 229.2 lb

## 2018-11-25 DIAGNOSIS — E782 Mixed hyperlipidemia: Secondary | ICD-10-CM

## 2018-11-25 DIAGNOSIS — E1165 Type 2 diabetes mellitus with hyperglycemia: Secondary | ICD-10-CM | POA: Diagnosis not present

## 2018-11-25 DIAGNOSIS — I10 Essential (primary) hypertension: Secondary | ICD-10-CM

## 2018-11-25 MED ORDER — ONETOUCH ULTRASOFT LANCETS MISC: 3 refills | Status: DC

## 2018-11-25 MED ORDER — METFORMIN HCL ER 500 MG PO TB24: ORAL_TABLET | ORAL | 3 refills | Status: DC

## 2018-11-25 MED ORDER — AMLODIPINE BESYLATE 10 MG PO TABS: 10.0000 mg | ORAL_TABLET | Freq: Every day | ORAL | 3 refills | Status: DC

## 2018-11-25 MED ORDER — GLUCOSE BLOOD VI STRP: ORAL_STRIP | 3 refills | Status: DC

## 2018-11-25 MED ORDER — LISINOPRIL 20 MG PO TABS: 20.0000 mg | ORAL_TABLET | Freq: Every day | ORAL | 3 refills | Status: DC

## 2018-11-25 NOTE — Assessment & Plan Note (Signed)
Well controlled. Continue current medication.  

## 2018-11-25 NOTE — Progress Notes (Signed)
Subjective:    Patient ID: Terri [name], female    DOB: 1951-03-26, 68 y.o.   MRN: [id number]  HPI  68 year old female presents for DM follow up  Diabetes:   Improving control..on glipizide ( now on 10 mg XL), metformin Lab Results  Component Value Date   HGBA1C 7.1 (H) 11/22/2018  Using medications without difficulties: Hypoglycemic episodes:none Hyperglycemic episodes: occ if eats wrong Feet problems:no ulcers Blood Sugars averaging: FBS 80-135 eye exam within last year:  Morbid obesity  Wt Readings from Last 3 Encounters:  11/25/18 229 lb 4 oz (104 kg)  07/26/18 228 lb 8 oz (103.6 kg)  03/25/18 225 lb 4 oz (102.2 kg)      Elevated Cholesterol: At goal on atorvastatin  Lab Results  Component Value Date   CHOL 115 11/22/2018   HDL 47.70 11/22/2018   LDLCALC 51 11/22/2018   LDLDIRECT 63.3 11/22/2013   TRIG 82.0 11/22/2018   CHOLHDL 2 11/22/2018  Using medications without problems:none Muscle aches: none Diet compliance: moderate Exercise:walking daily Other complaints:   Hypertension:    At goal on amlodipine, lisinopril BP Readings from Last 3 Encounters:  11/25/18 120/60  07/26/18 110/70  03/25/18 132/62  Using medication without problems or lightheadedness: none Chest pain with exertion:none Edema:none Short of breath: none Average home BPs: Other issues:    Called on 11/20/18 with odor  And darkness to urine.. now resolved.  No burning with urination. HX of kidney stone Social History /Family History/Past Medical History reviewed in detail and updated in EMR if needed. Blood pressure 120/60, pulse 78, temperature 97.8 F (36.6 C), temperature source Oral, height 5' 6.5" (1.689 m), weight 229 lb 4 oz (104 kg).  Review of Systems  Constitutional: Negative for fatigue and fever.  HENT: Negative for congestion.   Eyes: Negative for pain.  Respiratory: Negative for cough and shortness of breath.   Cardiovascular: Negative for chest pain,  palpitations and leg swelling.  Gastrointestinal: Negative for abdominal pain.  Genitourinary: Negative for dysuria and vaginal bleeding.  Musculoskeletal: Negative for back pain.  Neurological: Negative for syncope, light-headedness and headaches.  Psychiatric/Behavioral: Negative for dysphoric mood.       Objective:   Physical Exam Constitutional:      General: She is not in acute distress.    Appearance: Normal appearance. She is well-developed. She is obese. She is not ill-appearing or toxic-appearing.  HENT:     Head: Normocephalic.     Right Ear: Hearing, tympanic membrane, ear canal and external ear normal. Tympanic membrane is not erythematous, retracted or bulging.     Left Ear: Hearing, tympanic membrane, ear canal and external ear normal. Tympanic membrane is not erythematous, retracted or bulging.     Nose: No mucosal edema or rhinorrhea.     Right Sinus: No maxillary sinus tenderness or frontal sinus tenderness.     Left Sinus: No maxillary sinus tenderness or frontal sinus tenderness.     Mouth/Throat:     Pharynx: Uvula midline.  Eyes:     General: Lids are normal. Lids are everted, no foreign bodies appreciated.     Conjunctiva/sclera: Conjunctivae normal.     Pupils: Pupils are equal, round, and reactive to light.  Neck:     Musculoskeletal: Normal range of motion and neck supple.     Thyroid: No thyroid mass or thyromegaly.     Vascular: No carotid bruit.     Trachea: Trachea normal.  Cardiovascular:  Rate and Rhythm: Normal rate and regular rhythm.     Pulses: Normal pulses.     Heart sounds: Normal heart sounds, S1 normal and S2 normal. No murmur. No friction rub. No gallop.   Pulmonary:     Effort: Pulmonary effort is normal. No tachypnea or respiratory distress.     Breath sounds: Normal breath sounds. No decreased breath sounds, wheezing, rhonchi or rales.  Abdominal:     General: Bowel sounds are normal.     Palpations: Abdomen is soft.      Tenderness: There is no abdominal tenderness.  Skin:    General: Skin is warm and dry.     Findings: No rash.  Neurological:     Mental Status: She is alert.  Psychiatric:        Mood and Affect: Mood is not anxious or depressed.        Speech: Speech normal.        Behavior: Behavior normal. Behavior is cooperative.        Thought Content: Thought content normal.        Judgment: Judgment normal.     Diabetic foot exam: Normal inspection No skin breakdown No calluses  Normal DP pulses Normal sensation to light touch and monofilament Nails normal      Assessment & Plan:

## 2018-11-25 NOTE — Assessment & Plan Note (Addendum)
Improving control on  Max glipizide and metformin ( max tolerated dose, more =diarrhea).

## 2018-11-25 NOTE — Patient Instructions (Signed)
Go to ER if severe abdominal pain recurs.. caln make appt if it recurs and not severe.  Keep up the great work with lifestyle changes!

## 2019-02-24 DIAGNOSIS — H25093 Other age-related incipient cataract, bilateral: Secondary | ICD-10-CM | POA: Diagnosis not present

## 2019-02-24 DIAGNOSIS — E1165 Type 2 diabetes mellitus with hyperglycemia: Secondary | ICD-10-CM | POA: Diagnosis not present

## 2019-02-24 LAB — HM DIABETES EYE EXAM

## 2019-03-02 DIAGNOSIS — D0511 Intraductal carcinoma in situ of right breast: Secondary | ICD-10-CM | POA: Diagnosis not present

## 2019-03-02 DIAGNOSIS — N6489 Other specified disorders of breast: Secondary | ICD-10-CM | POA: Diagnosis not present

## 2019-04-19 ENCOUNTER — Other Ambulatory Visit: Payer: Self-pay | Admitting: Family Medicine

## 2019-04-19 NOTE — Telephone Encounter (Signed)
Electronic refill request Nortriptyline Last office visit 11/25/18 Last refill 12/16/17 #30/11

## 2019-04-20 ENCOUNTER — Other Ambulatory Visit: Payer: Self-pay | Admitting: Family Medicine

## 2019-05-06 DIAGNOSIS — Z1159 Encounter for screening for other viral diseases: Secondary | ICD-10-CM | POA: Diagnosis not present

## 2019-05-06 DIAGNOSIS — Z20828 Contact with and (suspected) exposure to other viral communicable diseases: Secondary | ICD-10-CM | POA: Diagnosis not present

## 2019-05-26 ENCOUNTER — Other Ambulatory Visit: Payer: Self-pay

## 2019-05-26 ENCOUNTER — Ambulatory Visit (INDEPENDENT_AMBULATORY_CARE_PROVIDER_SITE_OTHER): Payer: Medicare Other | Admitting: Family Medicine

## 2019-05-26 ENCOUNTER — Ambulatory Visit: Payer: Medicare Other | Admitting: Family Medicine

## 2019-05-26 ENCOUNTER — Encounter: Payer: Self-pay | Admitting: Family Medicine

## 2019-05-26 VITALS — BP 120/66 | HR 76 | Temp 97.9°F | Ht 66.5 in | Wt 234.0 lb

## 2019-05-26 DIAGNOSIS — Z23 Encounter for immunization: Secondary | ICD-10-CM

## 2019-05-26 DIAGNOSIS — E1165 Type 2 diabetes mellitus with hyperglycemia: Secondary | ICD-10-CM

## 2019-05-26 DIAGNOSIS — I10 Essential (primary) hypertension: Secondary | ICD-10-CM

## 2019-05-26 LAB — POCT GLYCOSYLATED HEMOGLOBIN (HGB A1C): Hemoglobin A1C: 7 % — AB (ref 4.0–5.6)

## 2019-05-26 NOTE — Assessment & Plan Note (Signed)
Improving control on current regimen. Encouraged exercise, weight loss, healthy eating habits.   

## 2019-05-26 NOTE — Progress Notes (Signed)
Chief Complaint  Patient presents with  . Diabetes    History of Present Illness: HPI  68 year old female presents for DM follow up  Diabetes:  Improving control..on glipizide ( now on 10 mg XL), metformin Lab Results  Component Value Date   HGBA1C 7.0 (A) 05/26/2019  Using medications without difficulties: Hypoglycemic episodes: Hyperglycemic episodes: Feet problems: no ulcers Blood Sugars averaging: eye exam within last year: yes 01/2019  Moderate diet.  Exercise: minimal.  Hypertension:    Good control on lisinopril and amlodipine BP Readings from Last 3 Encounters:  05/26/19 120/66  11/25/18 120/60  07/26/18 110/70  Using medication without problems or lightheadedness:  none Chest pain with exertion:None Edema:none Short of breath:none Average home BPs: Other issues:   COVID 19 screen No recent travel or known exposure to [place] The patient denies respiratory symptoms of COVID 19 at this time.  The importance of social distancing was discussed today.   Review of Systems  Constitutional: Negative for chills and fever.  HENT: Negative for congestion and ear pain.   Eyes: Negative for pain and redness.  Respiratory: Negative for cough and shortness of breath.   Cardiovascular: Negative for chest pain, palpitations and leg swelling.  Gastrointestinal: Negative for abdominal pain, blood in stool, constipation, diarrhea, nausea and vomiting.  Genitourinary: Negative for dysuria.  Musculoskeletal: Negative for falls and myalgias.  Skin: Negative for rash.  Neurological: Negative for dizziness.  Psychiatric/Behavioral: Negative for depression. The patient is not nervous/anxious.       Past Medical History:  Diagnosis Date  . Abdominal mass   . Abnormal coagulation profile   . Arthritis   . CAD (coronary artery disease)   . Diabetes mellitus type II, uncontrolled ([place])   . GERD (gastroesophageal reflux disease)   . Hyperlipidemia   . Hypertension   .  Mesenteric mass 40/3474   complicated by nausea & vomiting  . Other postoperative infection   . S/P PICC central line placement 10/2008   line sepsis-right PICC line removed     reports that she has never smoked. She has never used smokeless tobacco. She reports that she does not drink alcohol or use drugs.   Current Outpatient Medications:  .  amLODipine (NORVASC) 10 MG tablet, Take 1 tablet (10 mg total) by mouth daily., Disp: 90 tablet, Rfl: 3 .  aspirin 81 MG tablet, Take 81 mg by mouth daily., Disp: , Rfl:  .  atorvastatin (LIPITOR) 40 MG tablet, TAKE 1 TABLET DAILY, Disp: 90 tablet, Rfl: 2 .  benzonatate (TESSALON) 200 MG capsule, Take 1 capsule (200 mg total) by mouth 3 (three) times daily as needed for cough. Swallow whole- do not bite pill, Disp: 30 capsule, Rfl: 0 .  Blood Glucose Monitoring Suppl (ONETOUCH VERIO IQ SYSTEM) w/Device KIT, , Disp: , Rfl:  .  Calcium Carbonate (CALCIUM 600 PO), Take 1 tablet by mouth daily., Disp: , Rfl:  .  Cholecalciferol (VITAMIN D3 PO), Take 2,000 Units by mouth daily., Disp: , Rfl:  .  ergocalciferol (VITAMIN D2) 50000 units capsule, Take 50,000 Units by mouth once a week., Disp: , Rfl:  .  glipiZIDE (GLUCOTROL XL) 10 MG 24 hr tablet, Take 1 tablet (10 mg total) by mouth daily with breakfast., Disp: 90 tablet, Rfl: 3 .  glucose blood (ONETOUCH VERIO) test strip, USE TO CHECK BLOOD SUGAR DAILY  DX: E11.65, Disp: 100 each, Rfl: 3 .  Lancets (ONETOUCH ULTRASOFT) lancets, Use as instructed, Disp: 100 each, Rfl: 3 .  letrozole (FEMARA) 2.5 MG tablet, Take 2.5 mg by mouth daily., Disp: , Rfl:  .  lisinopril (PRINIVIL,ZESTRIL) 20 MG tablet, Take 1 tablet (20 mg total) by mouth daily., Disp: 90 tablet, Rfl: 3 .  Lysine HCl 500 MG TABS, Take 1 tablet (500 mg total) by mouth daily., Disp: 90 tablet, Rfl: 1 .  metFORMIN (GLUCOPHAGE-XR) 500 MG 24 hr tablet, TAKE 2 TABLETS DAILY WITH BREAKFAST, Disp: 180 tablet, Rfl: 3 .  nortriptyline (PAMELOR) 10 MG capsule,  Take 1 capsule by mouth at bedtime, Disp: 30 capsule, Rfl: 0 .  nystatin cream (MYCOSTATIN), Apply 1 application topically 2 (two) times daily., Disp: 30 g, Rfl: 1 .  pantoprazole (PROTONIX) 40 MG tablet, Take 1 tablet (40 mg total) by mouth daily., Disp: 90 tablet, Rfl: 3  Current Facility-Administered Medications:  .  0.9 %  sodium chloride infusion, 500 mL, Intravenous, Once, Danis, Estill Cotta III, MD   Observations/Objective: Blood pressure 120/66, pulse 76, temperature 97.9 F (36.6 C), temperature source Temporal, height 5' 6.5" (1.689 m), weight 234 lb (106.1 kg), SpO2 97 %.  Physical Exam Constitutional:      General: She is not in acute distress.    Appearance: Normal appearance. She is well-developed. She is not ill-appearing or toxic-appearing.  HENT:     Head: Normocephalic.     Right Ear: Hearing, tympanic membrane, ear canal and external ear normal. Tympanic membrane is not erythematous, retracted or bulging.     Left Ear: Hearing, tympanic membrane, ear canal and external ear normal. Tympanic membrane is not erythematous, retracted or bulging.     Nose: No mucosal edema or rhinorrhea.     Right Sinus: No maxillary sinus tenderness or frontal sinus tenderness.     Left Sinus: No maxillary sinus tenderness or frontal sinus tenderness.     Mouth/Throat:     Pharynx: Uvula midline.  Eyes:     General: Lids are normal. Lids are everted, no foreign bodies appreciated.     Conjunctiva/sclera: Conjunctivae normal.     Pupils: Pupils are equal, round, and reactive to light.  Neck:     Musculoskeletal: Normal range of motion and neck supple.     Thyroid: No thyroid mass or thyromegaly.     Vascular: No carotid bruit.     Trachea: Trachea normal.  Cardiovascular:     Rate and Rhythm: Normal rate and regular rhythm.     Pulses: Normal pulses.     Heart sounds: Normal heart sounds, S1 normal and S2 normal. No murmur. No friction rub. No gallop.   Pulmonary:     Effort: Pulmonary  effort is normal. No tachypnea or respiratory distress.     Breath sounds: Normal breath sounds. No decreased breath sounds, wheezing, rhonchi or rales.  Abdominal:     General: Bowel sounds are normal.     Palpations: Abdomen is soft.     Tenderness: There is no abdominal tenderness.  Skin:    General: Skin is warm and dry.     Findings: No rash.  Neurological:     Mental Status: She is alert.  Psychiatric:        Mood and Affect: Mood is not anxious or depressed.        Speech: Speech normal.        Behavior: Behavior normal. Behavior is cooperative.        Thought Content: Thought content normal.        Judgment: Judgment normal.  Assessment and Plan      Amy Bedsole, MD   

## 2019-05-26 NOTE — Patient Instructions (Signed)
Get back on track with diet changes and regular exercise.

## 2019-05-26 NOTE — Assessment & Plan Note (Signed)
Well controlled. Continue current medication.  

## 2019-06-06 DIAGNOSIS — Z9189 Other specified personal risk factors, not elsewhere classified: Secondary | ICD-10-CM | POA: Diagnosis not present

## 2019-06-06 DIAGNOSIS — D0511 Intraductal carcinoma in situ of right breast: Secondary | ICD-10-CM | POA: Diagnosis not present

## 2019-06-06 DIAGNOSIS — Z9011 Acquired absence of right breast and nipple: Secondary | ICD-10-CM | POA: Diagnosis not present

## 2019-06-06 DIAGNOSIS — Z79811 Long term (current) use of aromatase inhibitors: Secondary | ICD-10-CM | POA: Diagnosis not present

## 2019-06-22 DIAGNOSIS — I890 Lymphedema, not elsewhere classified: Secondary | ICD-10-CM | POA: Diagnosis not present

## 2019-06-22 DIAGNOSIS — M79601 Pain in right arm: Secondary | ICD-10-CM | POA: Diagnosis not present

## 2019-07-06 DIAGNOSIS — M79601 Pain in right arm: Secondary | ICD-10-CM | POA: Diagnosis not present

## 2019-07-06 DIAGNOSIS — I890 Lymphedema, not elsewhere classified: Secondary | ICD-10-CM | POA: Diagnosis not present

## 2019-07-11 DIAGNOSIS — M79601 Pain in right arm: Secondary | ICD-10-CM | POA: Diagnosis not present

## 2019-07-11 DIAGNOSIS — I890 Lymphedema, not elsewhere classified: Secondary | ICD-10-CM | POA: Diagnosis not present

## 2019-08-03 ENCOUNTER — Other Ambulatory Visit: Payer: Self-pay | Admitting: Family Medicine

## 2019-08-10 DIAGNOSIS — M79601 Pain in right arm: Secondary | ICD-10-CM | POA: Diagnosis not present

## 2019-08-10 DIAGNOSIS — I890 Lymphedema, not elsewhere classified: Secondary | ICD-10-CM | POA: Diagnosis not present

## 2019-08-17 DIAGNOSIS — D0511 Intraductal carcinoma in situ of right breast: Secondary | ICD-10-CM | POA: Diagnosis not present

## 2019-08-28 ENCOUNTER — Other Ambulatory Visit: Payer: Self-pay | Admitting: Family Medicine

## 2019-08-28 NOTE — Telephone Encounter (Signed)
Last office visit 05/26/2019 for DM.  Last refilled 09/09/2017 for 30 g with 1 refill.  CPE scheduled for 12/08/2019.

## 2019-09-05 DIAGNOSIS — Z20828 Contact with and (suspected) exposure to other viral communicable diseases: Secondary | ICD-10-CM | POA: Diagnosis not present

## 2019-10-19 ENCOUNTER — Other Ambulatory Visit: Payer: Self-pay | Admitting: *Deleted

## 2019-10-19 MED ORDER — METFORMIN HCL ER 500 MG PO TB24: ORAL_TABLET | ORAL | 0 refills | Status: DC

## 2019-10-19 MED ORDER — LISINOPRIL 20 MG PO TABS: 20.0000 mg | ORAL_TABLET | Freq: Every day | ORAL | 0 refills | Status: DC

## 2019-10-19 MED ORDER — ATORVASTATIN CALCIUM 40 MG PO TABS: 40.0000 mg | ORAL_TABLET | Freq: Every day | ORAL | 0 refills | Status: DC

## 2019-10-19 MED ORDER — GLIPIZIDE ER 10 MG PO TB24: 10.0000 mg | ORAL_TABLET | Freq: Every day | ORAL | 0 refills | Status: DC

## 2019-10-19 MED ORDER — AMLODIPINE BESYLATE 10 MG PO TABS: 10.0000 mg | ORAL_TABLET | Freq: Every day | ORAL | 0 refills | Status: DC

## 2019-11-03 ENCOUNTER — Other Ambulatory Visit: Payer: Medicare Other

## 2019-11-22 ENCOUNTER — Telehealth: Payer: Self-pay

## 2019-11-22 NOTE — Telephone Encounter (Signed)
LVM w COVID screen, front door and back lab info 2.24.2021 TLJ 

## 2019-11-23 ENCOUNTER — Telehealth: Payer: Self-pay | Admitting: Family Medicine

## 2019-11-23 DIAGNOSIS — E1165 Type 2 diabetes mellitus with hyperglycemia: Secondary | ICD-10-CM

## 2019-11-23 NOTE — Telephone Encounter (Signed)
-----   Message from Alvina Chou sent at 11/10/2019  9:55 AM EST ----- Regarding: Lab orders for Friday, 2.26.21 Patient is scheduled for CPX labs, please order future labs, Thanks , Camelia Eng

## 2019-11-24 ENCOUNTER — Other Ambulatory Visit: Payer: Self-pay

## 2019-11-24 ENCOUNTER — Other Ambulatory Visit (INDEPENDENT_AMBULATORY_CARE_PROVIDER_SITE_OTHER): Payer: Medicare Other

## 2019-11-24 DIAGNOSIS — E1165 Type 2 diabetes mellitus with hyperglycemia: Secondary | ICD-10-CM | POA: Diagnosis not present

## 2019-11-24 LAB — LIPID PANEL
Cholesterol: 110 mg/dL (ref 0–200)
HDL: 40.8 mg/dL (ref >39.00)
LDL Cholesterol: 55 mg/dL (ref 0–99)
NonHDL: 69.22
Total CHOL/HDL Ratio: 3
Triglycerides: 73 mg/dL (ref 0.0–149.0)
VLDL: 14.6 mg/dL (ref 0.0–40.0)

## 2019-11-24 LAB — COMPREHENSIVE METABOLIC PANEL
ALT: 15 U/L (ref 0–35)
AST: 15 U/L (ref 0–37)
Albumin: 3.9 g/dL (ref 3.5–5.2)
Alkaline Phosphatase: 114 U/L (ref 39–117)
BUN: 8 mg/dL (ref 6–23)
CO2: 32 mEq/L (ref 19–32)
Calcium: 9.5 mg/dL (ref 8.4–10.5)
Chloride: 105 mEq/L (ref 96–112)
Creatinine, Ser: 0.75 mg/dL (ref 0.40–1.20)
GFR: 92.37 mL/min (ref >60.00)
Glucose, Bld: 131 mg/dL — ABNORMAL HIGH (ref 70–99)
Potassium: 3.5 mEq/L (ref 3.5–5.1)
Sodium: 143 mEq/L (ref 135–145)
Total Bilirubin: 0.7 mg/dL (ref 0.2–1.2)
Total Protein: 6.8 g/dL (ref 6.0–8.3)

## 2019-11-24 NOTE — Progress Notes (Signed)
No critical labs need to be addressed urgently. We will discuss labs in detail at upcoming office visit.   

## 2019-11-24 NOTE — Addendum Note (Signed)
Addended by: Alvina Chou on: 11/24/2019 08:40 AM   Modules accepted: Orders

## 2019-11-25 LAB — HEMOGLOBIN A1C
Hgb A1c MFr Bld: 7.2 % of total Hgb — ABNORMAL HIGH (ref <5.7)
Mean Plasma Glucose: 160 (calc)
eAG (mmol/L): 8.9 (calc)

## 2019-11-27 NOTE — Progress Notes (Signed)
No critical labs need to be addressed urgently. We will discuss labs in detail at upcoming office visit.   

## 2019-12-04 LAB — HM DIABETES FOOT EXAM

## 2019-12-08 ENCOUNTER — Ambulatory Visit (INDEPENDENT_AMBULATORY_CARE_PROVIDER_SITE_OTHER): Payer: Medicare Other | Admitting: Family Medicine

## 2019-12-08 ENCOUNTER — Encounter: Payer: Medicare Other | Admitting: Family Medicine

## 2019-12-08 ENCOUNTER — Other Ambulatory Visit: Payer: Self-pay

## 2019-12-08 ENCOUNTER — Encounter: Payer: Self-pay | Admitting: Family Medicine

## 2019-12-08 VITALS — BP 128/76 | HR 71 | Temp 97.0°F | Ht 66.5 in | Wt 233.0 lb

## 2019-12-08 DIAGNOSIS — Z0000 Encounter for general adult medical examination without abnormal findings: Secondary | ICD-10-CM | POA: Diagnosis not present

## 2019-12-08 DIAGNOSIS — E1159 Type 2 diabetes mellitus with other circulatory complications: Secondary | ICD-10-CM

## 2019-12-08 DIAGNOSIS — E785 Hyperlipidemia, unspecified: Secondary | ICD-10-CM

## 2019-12-08 DIAGNOSIS — I10 Essential (primary) hypertension: Secondary | ICD-10-CM | POA: Diagnosis not present

## 2019-12-08 DIAGNOSIS — Z6837 Body mass index (BMI) 37.0-37.9, adult: Secondary | ICD-10-CM

## 2019-12-08 DIAGNOSIS — E1169 Type 2 diabetes mellitus with other specified complication: Secondary | ICD-10-CM

## 2019-12-08 DIAGNOSIS — E1165 Type 2 diabetes mellitus with hyperglycemia: Secondary | ICD-10-CM | POA: Diagnosis not present

## 2019-12-08 MED ORDER — NORTRIPTYLINE HCL 10 MG PO CAPS: 10.0000 mg | ORAL_CAPSULE | Freq: Every day | ORAL | 0 refills | Status: AC

## 2019-12-08 NOTE — Patient Instructions (Signed)
Keep working on health eating and regular exercise!

## 2019-12-08 NOTE — Assessment & Plan Note (Signed)
Well controlled. Continue current medication.  

## 2019-12-08 NOTE — Progress Notes (Signed)
Chief Complaint  Patient presents with  . Medicare Wellness    History of Present Illness: HPI  The patient presents for annual medicare wellness, complete physical and review of chronic health problems. He/She also has the following acute concerns today: none  I have personally reviewed the Medicare Annual Wellness questionnaire and have noted 1. The patient's medical and social history 2. Their use of alcohol, tobacco or illicit drugs 3. Their current medications and supplements 4. The patient's functional ability including ADL's, fall risks, home safety risks and hearing or visual             impairment. 5. Diet and physical activities 6. Evidence for depression or mood disorders 7.         Updated provider list Cognitive evaluation was performed and recorded on pt medicare questionnaire form. The patients weight, height, BMI and visual acuity have been recorded in the chart  I have made referrals, counseling and provided education to the patient based review of the above and I have provided the pt with a written personalized care plan for preventive services.   Documentation of this information was scanned into the electronic record under the media tab.  Advance directives and end of life planning reviewed in detail with patient and documented in EMR. Patient given handout on advance care directives if needed. HCPOA and living will updated if needed.   Hearing Screening   125Hz  250Hz  500Hz  1000Hz  2000Hz  3000Hz  4000Hz  6000Hz  8000Hz   Right ear:   40 40 40  40    Left ear:   40 40 40  40    Vision Screening Comments: Pt had eye exam in 01/2019 at [place]  12/08/2019 03/25/2018 11/30/2016 11/22/2015 08/27/2014  Falls in the past year? 0 No No No No      Office Visit from 12/08/2019 in [place] at [facility] Total Score  0       Hypertension:   At goal.   BP Readings from Last 3 Encounters:  12/08/19 128/76  05/26/19 120/66  11/25/18  120/60  Using medication without problems or lightheadedness: none Chest pain with exertion:none Edema:none Short of breath:none Average home BPs: Other issues:  Diabetes:  Borderline control on current regimen.  Lab Results  Component Value Date   HGBA1C 7.2 (H) 11/24/2019  Using medications without difficulties: Hypoglycemic episodes:none Hyperglycemic episodes: none Feet problems: no ulcers Blood Sugars averaging: eye exam within last year: yes  Elevated Cholesterol:  LDL at goal on statin. Give CAD history. Lab Results  Component Value Date   CHOL 110 11/24/2019   HDL 40.80 11/24/2019   LDLCALC 55 11/24/2019   LDLDIRECT 63.3 11/22/2013   TRIG 73.0 11/24/2019   CHOLHDL 3 11/24/2019  Using medications without problems: Muscle aches:  Diet compliance: moderate Exercise: walking at mall Other complaints:   Wt Readings from Last 3 Encounters:  12/08/19 233 lb (105.7 kg)  05/26/19 234 lb (106.1 kg)  11/25/18 229 lb 4 oz (104 kg)   Breast cancer on femara.   Using nortriptyline for sleep and headaches prn.  This visit occurred during the SARS-CoV-2 public health emergency.  Safety protocols were in place, including screening questions prior to the visit, additional usage of staff PPE, and extensive cleaning of exam room while observing appropriate contact time as indicated for disinfecting solutions.   COVID 19 screen:  No recent travel or known exposure to COVID19 The patient denies respiratory symptoms of COVID 19  at this time. The importance of social distancing was discussed today.     Review of Systems  Constitutional: Positive for malaise/fatigue. Negative for chills and fever.  HENT: Negative for congestion and ear pain.   Eyes: Negative for pain and redness.  Respiratory: Negative for cough and shortness of breath.   Cardiovascular: Negative for chest pain, palpitations and leg swelling.  Gastrointestinal: Negative for abdominal pain, blood in stool,  constipation, diarrhea, nausea and vomiting.  Genitourinary: Negative for dysuria.  Musculoskeletal: Negative for falls and myalgias.  Skin: Negative for rash.  Neurological: Negative for dizziness.  Psychiatric/Behavioral: Negative for depression. The patient is not nervous/anxious.       Past Medical History:  Diagnosis Date  . Abdominal mass   . Abnormal coagulation profile   . Arthritis   . CAD (coronary artery disease)   . Diabetes mellitus type II, uncontrolled ([place])   . GERD (gastroesophageal reflux disease)   . Hyperlipidemia   . Hypertension   . Mesenteric mass 25/9563   complicated by nausea & vomiting  . Other postoperative infection   . S/P PICC central line placement 10/2008   line sepsis-right PICC line removed     reports that she has never smoked. She has never used smokeless tobacco. She reports that she does not drink alcohol or use drugs.   Current Outpatient Medications:  .  amLODipine (NORVASC) 10 MG tablet, Take 1 tablet (10 mg total) by mouth daily., Disp: 90 tablet, Rfl: 0 .  aspirin 81 MG tablet, Take 81 mg by mouth daily., Disp: , Rfl:  .  atorvastatin (LIPITOR) 40 MG tablet, Take 1 tablet (40 mg total) by mouth daily., Disp: 90 tablet, Rfl: 0 .  Blood Glucose Monitoring Suppl (ONETOUCH VERIO IQ SYSTEM) w/Device KIT, , Disp: , Rfl:  .  Calcium Carbonate (CALCIUM 600 PO), Take 1 tablet by mouth daily., Disp: , Rfl:  .  Cholecalciferol (VITAMIN D3 PO), Take 2,000 Units by mouth daily., Disp: , Rfl:  .  glipiZIDE (GLUCOTROL XL) 10 MG 24 hr tablet, Take 1 tablet (10 mg total) by mouth daily with breakfast., Disp: 90 tablet, Rfl: 0 .  glucose blood (ONETOUCH VERIO) test strip, USE TO CHECK BLOOD SUGAR DAILY  DX: E11.65, Disp: 100 each, Rfl: 3 .  Lancets (ONETOUCH ULTRASOFT) lancets, Use as instructed, Disp: 100 each, Rfl: 3 .  letrozole (FEMARA) 2.5 MG tablet, Take 2.5 mg by mouth daily., Disp: , Rfl:  .  lisinopril (ZESTRIL) 20 MG tablet, Take 1 tablet (20 mg  total) by mouth daily., Disp: 90 tablet, Rfl: 0 .  Lysine HCl 500 MG TABS, Take 1 tablet (500 mg total) by mouth daily. (Patient taking differently: Take 500 mg by mouth daily as needed. ), Disp: 90 tablet, Rfl: 1 .  metFORMIN (GLUCOPHAGE-XR) 500 MG 24 hr tablet, TAKE 2 TABLETS DAILY WITH BREAKFAST, Disp: 180 tablet, Rfl: 0 .  nortriptyline (PAMELOR) 10 MG capsule, Take 1 capsule by mouth at bedtime (Patient taking differently: Take 10 mg by mouth daily as needed. ), Disp: 30 capsule, Rfl: 0 .  nystatin cream (MYCOSTATIN), APPLY TO AFFECTED AREA TWICE A DAY (Patient taking differently: Apply 1 application topically daily as needed. ), Disp: 30 g, Rfl: 1   Observations/Objective: Blood pressure 128/76, pulse 71, temperature (!) 97 F (36.1 C), temperature source Temporal, height 5' 6.5" (1.689 m), weight 233 lb (105.7 kg), SpO2 98 %.  Physical Exam Constitutional:      General: She is not in  acute distress.    Appearance: Normal appearance. She is well-developed. She is obese. She is not ill-appearing or toxic-appearing.  HENT:     Head: Normocephalic.     Right Ear: Hearing, tympanic membrane, ear canal and external ear normal.     Left Ear: Hearing, tympanic membrane, ear canal and external ear normal.     Nose: Nose normal.  Eyes:     General: Lids are normal. Lids are everted, no foreign bodies appreciated.     Conjunctiva/sclera: Conjunctivae normal.     Pupils: Pupils are equal, round, and reactive to light.  Neck:     Thyroid: No thyroid mass or thyromegaly.     Vascular: No carotid bruit.     Trachea: Trachea normal.  Cardiovascular:     Rate and Rhythm: Normal rate and regular rhythm.     Heart sounds: Normal heart sounds, S1 normal and S2 normal. No murmur. No gallop.   Pulmonary:     Effort: Pulmonary effort is normal. No respiratory distress.     Breath sounds: Normal breath sounds. No wheezing, rhonchi or rales.  Abdominal:     General: Bowel sounds are normal. There is  no distension or abdominal bruit.     Palpations: Abdomen is soft. There is no fluid wave or mass.     Tenderness: There is no abdominal tenderness. There is no guarding or rebound.     Hernia: No hernia is present.  Musculoskeletal:     Cervical back: Normal range of motion and neck supple.  Lymphadenopathy:     Cervical: No cervical adenopathy.     Comments: right arm swelling secondary to lymphadenopathy, right hand in compression glove.  Skin:    General: Skin is warm and dry.     Findings: No rash.  Neurological:     Mental Status: She is alert.     Cranial Nerves: No cranial nerve deficit.     Sensory: No sensory deficit.  Psychiatric:        Mood and Affect: Mood is not anxious or depressed.        Speech: Speech normal.        Behavior: Behavior normal. Behavior is cooperative.        Judgment: Judgment normal.      Diabetic foot exam: Normal inspection No skin breakdown No calluses  Normal DP pulses Normal sensation to light touch and monofilament Nails normal  Assessment and Plan The patient's preventative maintenance and recommended screening tests for an annual wellness exam were reviewed in full today. Brought up to date unless services declined.  Counselled on the importance of diet, exercise, and its role in overall health and mortality. The patient's FH and SH was reviewed, including their home life, tobacco status, and drug and alcohol status.   Vaccines:uptodate Has had COVID19 vaccines x 2  Pap/DVE:S/P TAH. Mammo:Stable April 2019, DUE Hx of breast cancer right arm lymphadenopathy. Bone Density:2017 normal Colon:01/2018 no polyps,repeat in 10- year, Dr. Loletha Carrow Smoking Status:none ETOH/ drug MVH:QION/GEXB Hep C:done     Eliezer Lofts, MD

## 2019-12-08 NOTE — Assessment & Plan Note (Signed)
Encouraged exercise, weight loss, healthy eating habits. ? ?

## 2020-01-18 ENCOUNTER — Other Ambulatory Visit: Payer: Self-pay | Admitting: *Deleted

## 2020-01-18 MED ORDER — ATORVASTATIN CALCIUM 40 MG PO TABS: 40.0000 mg | ORAL_TABLET | Freq: Every day | ORAL | 3 refills | Status: DC

## 2020-01-18 MED ORDER — LISINOPRIL 20 MG PO TABS: 20.0000 mg | ORAL_TABLET | Freq: Every day | ORAL | 3 refills | Status: DC

## 2020-01-18 MED ORDER — AMLODIPINE BESYLATE 10 MG PO TABS: 10.0000 mg | ORAL_TABLET | Freq: Every day | ORAL | 3 refills | Status: DC

## 2020-01-18 MED ORDER — GLIPIZIDE ER 10 MG PO TB24: 10.0000 mg | ORAL_TABLET | Freq: Every day | ORAL | 3 refills | Status: DC

## 2020-01-18 MED ORDER — METFORMIN HCL ER 500 MG PO TB24: ORAL_TABLET | ORAL | 3 refills | Status: DC

## 2020-01-18 NOTE — Addendum Note (Signed)
Addended by: Damita Lack on: 01/18/2020 03:20 PM   Modules accepted: Orders

## 2020-02-23 ENCOUNTER — Other Ambulatory Visit: Payer: Self-pay

## 2020-02-23 MED ORDER — LISINOPRIL 20 MG PO TABS: 20.0000 mg | ORAL_TABLET | Freq: Every day | ORAL | 3 refills | Status: DC

## 2020-02-23 NOTE — Telephone Encounter (Signed)
[facility] left v/m that the LIsinopril 20 mg that was faxed to [facility] on 01/18/20 # 90 x 3 was not received; cannot find any refills for lisinopril 20 mg; Request to be resent by fax to fax # [phone].

## 2020-03-07 DIAGNOSIS — N6489 Other specified disorders of breast: Secondary | ICD-10-CM | POA: Diagnosis not present

## 2020-03-07 DIAGNOSIS — Z853 Personal history of malignant neoplasm of breast: Secondary | ICD-10-CM | POA: Diagnosis not present

## 2020-03-07 DIAGNOSIS — D0511 Intraductal carcinoma in situ of right breast: Secondary | ICD-10-CM | POA: Diagnosis not present

## 2020-03-07 LAB — HM MAMMOGRAPHY

## 2020-03-12 ENCOUNTER — Ambulatory Visit: Payer: Medicare Other | Admitting: Family Medicine

## 2020-03-12 ENCOUNTER — Ambulatory Visit (INDEPENDENT_AMBULATORY_CARE_PROVIDER_SITE_OTHER): Payer: Medicare Other | Admitting: Family Medicine

## 2020-03-12 ENCOUNTER — Other Ambulatory Visit: Payer: Self-pay

## 2020-03-12 ENCOUNTER — Encounter: Payer: Self-pay | Admitting: Family Medicine

## 2020-03-12 VITALS — BP 110/60 | HR 77 | Temp 97.6°F | Ht 66.5 in | Wt 230.8 lb

## 2020-03-12 DIAGNOSIS — R0609 Other forms of dyspnea: Secondary | ICD-10-CM | POA: Insufficient documentation

## 2020-03-12 DIAGNOSIS — R5383 Other fatigue: Secondary | ICD-10-CM | POA: Insufficient documentation

## 2020-03-12 DIAGNOSIS — R0600 Dyspnea, unspecified: Secondary | ICD-10-CM | POA: Diagnosis not present

## 2020-03-12 LAB — CBC WITH DIFFERENTIAL/PLATELET
Basophils Absolute: 0 10*3/uL (ref 0.0–0.1)
Basophils Relative: 0.5 % (ref 0.0–3.0)
Eosinophils Absolute: 0.1 10*3/uL (ref 0.0–0.7)
Eosinophils Relative: 1.2 % (ref 0.0–5.0)
HCT: 39.4 % (ref 36.0–46.0)
Hemoglobin: 13.3 g/dL (ref 12.0–15.0)
Lymphocytes Relative: 12.9 % (ref 12.0–46.0)
Lymphs Abs: 1.1 10*3/uL (ref 0.7–4.0)
MCHC: 33.8 g/dL (ref 30.0–36.0)
MCV: 88.4 fl (ref 78.0–100.0)
Monocytes Absolute: 0.6 10*3/uL (ref 0.1–1.0)
Monocytes Relative: 6.5 % (ref 3.0–12.0)
Neutro Abs: 6.9 10*3/uL (ref 1.4–7.7)
Neutrophils Relative %: 78.9 % — ABNORMAL HIGH (ref 43.0–77.0)
Platelets: 286 10*3/uL (ref 150.0–400.0)
RBC: 4.46 Mil/uL (ref 3.87–5.11)
RDW: 13.2 % (ref 11.5–15.5)
WBC: 8.8 10*3/uL (ref 4.0–10.5)

## 2020-03-12 LAB — COMPREHENSIVE METABOLIC PANEL
ALT: 17 U/L (ref 0–35)
AST: 14 U/L (ref 0–37)
Albumin: 4.2 g/dL (ref 3.5–5.2)
Alkaline Phosphatase: 118 U/L — ABNORMAL HIGH (ref 39–117)
BUN: 8 mg/dL (ref 6–23)
CO2: 32 mEq/L (ref 19–32)
Calcium: 9.4 mg/dL (ref 8.4–10.5)
Chloride: 104 mEq/L (ref 96–112)
Creatinine, Ser: 0.71 mg/dL (ref 0.40–1.20)
GFR: 98.31 mL/min (ref >60.00)
Glucose, Bld: 117 mg/dL — ABNORMAL HIGH (ref 70–99)
Potassium: 3.9 mEq/L (ref 3.5–5.1)
Sodium: 143 mEq/L (ref 135–145)
Total Bilirubin: 0.6 mg/dL (ref 0.2–1.2)
Total Protein: 6.5 g/dL (ref 6.0–8.3)

## 2020-03-12 LAB — HEMOGLOBIN A1C: Hgb A1c MFr Bld: 8 % — ABNORMAL HIGH (ref 4.6–6.5)

## 2020-03-12 LAB — T3, FREE: T3, Free: 3.6 pg/mL (ref 2.3–4.2)

## 2020-03-12 LAB — VITAMIN D 25 HYDROXY (VIT D DEFICIENCY, FRACTURES): VITD: 28.42 ng/mL — ABNORMAL LOW (ref 30.00–100.00)

## 2020-03-12 LAB — BRAIN NATRIURETIC PEPTIDE: Pro B Natriuretic peptide (BNP): 22 pg/mL (ref 0.0–100.0)

## 2020-03-12 LAB — VITAMIN B12: Vitamin B-12: 173 pg/mL — ABNORMAL LOW (ref 211–911)

## 2020-03-12 LAB — T4, FREE: Free T4: 1.09 ng/dL (ref 0.60–1.60)

## 2020-03-12 LAB — TSH: TSH: 1.67 u[IU]/mL (ref 0.35–4.50)

## 2020-03-12 NOTE — Progress Notes (Signed)
Chief Complaint  Patient presents with  . Fatigue    History of Present Illness: HPI   69 year old female with history of CAD, well controlled DM, HTN and breast cancer who presents with new onset fatigue, ongoing x several months. Restless at night cannot sleep for a long time.. but feels rested when waking.  Occ snoring, no apnea per husband.  She reports after eating a piece of  Water over the past weekend. Had nausea, upset stomach. NO D/C. Had some gas.. took gas X. No heartburn.   Since then since she has felt even worse.  No CP, some SOB.Marland Kitchen see below. No palpitations.  She has been having some cramps.  No new meds or changes in meds.  Husband has noted when going up stairs she is breathing heavier than usual in last 2 months.   Cardiologist in past: Dr. Jerelene Redden at [facility]. Medical management of CAD.Marland Kitchen Last cath 2008      This visit occurred during the SARS-CoV-2 public health emergency.  Safety protocols were in place, including screening questions prior to the visit, additional usage of staff PPE, and extensive cleaning of exam room while observing appropriate contact time as indicated for disinfecting solutions.   COVID 19 screen:  No recent travel or known exposure to COVID19 The patient denies respiratory symptoms of COVID 19 at this time. The importance of social distancing was discussed today.     Review of Systems  Constitutional: Positive for malaise/fatigue. Negative for chills, diaphoresis and fever.  HENT: Negative for congestion and ear pain.   Eyes: Negative for pain and redness.  Respiratory: Positive for shortness of breath. Negative for cough.   Cardiovascular: Negative for chest pain, palpitations and leg swelling.  Gastrointestinal: Negative for abdominal pain, blood in stool, constipation, diarrhea, nausea and vomiting.  Genitourinary: Negative for dysuria.  Musculoskeletal: Negative for falls and myalgias.  Skin: Negative for rash.   Neurological: Negative for dizziness.  Psychiatric/Behavioral: Negative for depression. The patient is not nervous/anxious.       Past Medical History:  Diagnosis Date  . Abdominal mass   . Abnormal coagulation profile   . Arthritis   . CAD (coronary artery disease)   . Diabetes mellitus type II, uncontrolled ([place])   . GERD (gastroesophageal reflux disease)   . Hyperlipidemia   . Hypertension   . Mesenteric mass 06/2724   complicated by nausea & vomiting  . Other postoperative infection   . S/P PICC central line placement 10/2008   line sepsis-right PICC line removed     reports that she has never smoked. She has never used smokeless tobacco. She reports that she does not drink alcohol and does not use drugs.   Current Outpatient Medications:  .  amLODipine (NORVASC) 10 MG tablet, Take 1 tablet (10 mg total) by mouth daily., Disp: 90 tablet, Rfl: 3 .  aspirin 81 MG tablet, Take 81 mg by mouth daily., Disp: , Rfl:  .  atorvastatin (LIPITOR) 40 MG tablet, Take 1 tablet (40 mg total) by mouth daily., Disp: 90 tablet, Rfl: 3 .  Blood Glucose Monitoring Suppl (ONETOUCH VERIO IQ SYSTEM) w/Device KIT, , Disp: , Rfl:  .  Calcium Carbonate (CALCIUM 600 PO), Take 1 tablet by mouth daily., Disp: , Rfl:  .  Cholecalciferol (VITAMIN D3 PO), Take 2,000 Units by mouth daily., Disp: , Rfl:  .  glipiZIDE (GLUCOTROL XL) 10 MG 24 hr tablet, Take 1 tablet (10 mg total) by mouth daily with breakfast., Disp:  90 tablet, Rfl: 3 .  glucose blood (ONETOUCH VERIO) test strip, USE TO CHECK BLOOD SUGAR DAILY  DX: E11.65, Disp: 100 each, Rfl: 3 .  Lancets (ONETOUCH ULTRASOFT) lancets, Use as instructed, Disp: 100 each, Rfl: 3 .  letrozole (FEMARA) 2.5 MG tablet, Take 2.5 mg by mouth daily., Disp: , Rfl:  .  lisinopril (ZESTRIL) 20 MG tablet, Take 1 tablet (20 mg total) by mouth daily., Disp: 90 tablet, Rfl: 3 .  Lysine HCl 500 MG TABS, Take 1 tablet (500 mg total) by mouth daily. (Patient taking differently:  Take 500 mg by mouth daily as needed. ), Disp: 90 tablet, Rfl: 1 .  metFORMIN (GLUCOPHAGE-XR) 500 MG 24 hr tablet, TAKE 2 TABLETS DAILY WITH BREAKFAST, Disp: 180 tablet, Rfl: 3 .  nortriptyline (PAMELOR) 10 MG capsule, Take 1 capsule (10 mg total) by mouth at bedtime., Disp: 90 capsule, Rfl: 0 .  nystatin cream (MYCOSTATIN), APPLY TO AFFECTED AREA TWICE A DAY (Patient taking differently: Apply 1 application topically daily as needed. ), Disp: 30 g, Rfl: 1   Observations/Objective: Blood pressure 110/60, pulse 77, temperature 97.6 F (36.4 C), temperature source Temporal, height 5' 6.5" (1.689 m), weight 230 lb 12 oz (104.7 kg), SpO2 97 %.  Physical Exam Constitutional:      General: She is not in acute distress.    Appearance: Normal appearance. She is well-developed. She is obese. She is not ill-appearing or toxic-appearing.  HENT:     Head: Normocephalic.     Right Ear: Hearing, tympanic membrane, ear canal and external ear normal. Tympanic membrane is not erythematous, retracted or bulging.     Left Ear: Hearing, tympanic membrane, ear canal and external ear normal. Tympanic membrane is not erythematous, retracted or bulging.     Nose: No mucosal edema or rhinorrhea.     Right Sinus: No maxillary sinus tenderness or frontal sinus tenderness.     Left Sinus: No maxillary sinus tenderness or frontal sinus tenderness.     Mouth/Throat:     Pharynx: Uvula midline.  Eyes:     General: Lids are normal. Lids are everted, no foreign bodies appreciated.     Conjunctiva/sclera: Conjunctivae normal.     Pupils: Pupils are equal, round, and reactive to light.  Neck:     Thyroid: No thyroid mass or thyromegaly.     Vascular: No carotid bruit.     Trachea: Trachea normal.  Cardiovascular:     Rate and Rhythm: Normal rate and regular rhythm.     Pulses: Normal pulses.     Heart sounds: Normal heart sounds, S1 normal and S2 normal. No murmur heard.  No friction rub. No gallop.   Pulmonary:      Effort: Pulmonary effort is normal. No tachypnea or respiratory distress.     Breath sounds: Normal breath sounds. No decreased breath sounds, wheezing, rhonchi or rales.  Abdominal:     General: Bowel sounds are normal.     Palpations: Abdomen is soft.     Tenderness: There is no abdominal tenderness.  Musculoskeletal:     Cervical back: Normal range of motion and neck supple.  Skin:    General: Skin is warm and dry.     Findings: No rash.  Neurological:     Mental Status: She is alert.  Psychiatric:        Mood and Affect: Mood is not anxious or depressed.        Speech: Speech normal.  Behavior: Behavior normal. Behavior is cooperative.        Thought Content: Thought content normal.        Judgment: Judgment normal.      Assessment and Plan  EKG: normal EKG, normal sinus rhythm, unchanged from previous tracings from 2009 except no sinus tachycardia now.   New onset fatigue, decreased endurance and DOE in last several months in pt with history of CAD.   Will eval with labs for etiology. No new change on EKG but at high risk for progression of CAD. Likely will need cardiac evaluation, stress test and possible EHCO.  Well controlled chol on atorvastatin Lab Results  Component Value Date   CHOL 110 11/24/2019   HDL 40.80 11/24/2019   LDLCALC 55 11/24/2019   LDLDIRECT 63.3 11/22/2013   TRIG 73.0 11/24/2019   CHOLHDL 3 11/24/2019    Moderate control DM at last check. Lab Results  Component Value Date   HGBA1C 7.2 (H) 11/24/2019      Eliezer Lofts, MD

## 2020-03-12 NOTE — Patient Instructions (Signed)
If severe shortness of breath or chest pain... go to ER.  Please stop at the lab to have labs drawn.

## 2020-03-12 NOTE — Addendum Note (Signed)
Addended by: Alvina Chou on: 03/12/2020 09:43 AM   Modules accepted: Orders

## 2020-03-19 NOTE — Addendum Note (Signed)
Addended by: Damita Lack on: 03/19/2020 09:22 AM   Modules accepted: Orders

## 2020-03-20 ENCOUNTER — Other Ambulatory Visit: Payer: Self-pay | Admitting: Family Medicine

## 2020-03-20 DIAGNOSIS — R0609 Other forms of dyspnea: Secondary | ICD-10-CM

## 2020-03-20 DIAGNOSIS — I2510 Atherosclerotic heart disease of native coronary artery without angina pectoris: Secondary | ICD-10-CM

## 2020-06-03 ENCOUNTER — Telehealth: Payer: Self-pay | Admitting: Family Medicine

## 2020-06-03 DIAGNOSIS — E1165 Type 2 diabetes mellitus with hyperglycemia: Secondary | ICD-10-CM

## 2020-06-03 NOTE — Telephone Encounter (Signed)
-----   Message from Aquilla Solian, RT sent at 05/21/2020  2:00 PM EDT ----- Regarding: Lab Orders for Tuesday 9.7.2021 Please place lab orders for Tuesday 9.7.2021, office visit for 6 month f/u on Tuesday 9.14.2021 Thank you, Jones Bales RT(R)

## 2020-06-04 ENCOUNTER — Other Ambulatory Visit: Payer: Medicare Other

## 2020-06-11 ENCOUNTER — Ambulatory Visit: Payer: Medicare Other | Admitting: Family Medicine

## 2020-06-17 DIAGNOSIS — I2510 Atherosclerotic heart disease of native coronary artery without angina pectoris: Secondary | ICD-10-CM | POA: Diagnosis not present

## 2020-06-17 DIAGNOSIS — I10 Essential (primary) hypertension: Secondary | ICD-10-CM | POA: Diagnosis not present

## 2020-06-17 DIAGNOSIS — R0600 Dyspnea, unspecified: Secondary | ICD-10-CM | POA: Diagnosis not present

## 2020-06-17 DIAGNOSIS — E7849 Other hyperlipidemia: Secondary | ICD-10-CM | POA: Diagnosis not present

## 2020-06-25 DIAGNOSIS — R5383 Other fatigue: Secondary | ICD-10-CM | POA: Diagnosis not present

## 2020-06-25 DIAGNOSIS — R0600 Dyspnea, unspecified: Secondary | ICD-10-CM | POA: Diagnosis not present

## 2020-06-26 ENCOUNTER — Other Ambulatory Visit: Payer: Medicare Other

## 2020-06-27 ENCOUNTER — Other Ambulatory Visit: Payer: Medicare Other

## 2020-07-02 ENCOUNTER — Ambulatory Visit: Payer: Medicare Other | Admitting: Family Medicine

## 2020-07-08 LAB — HM DIABETES FOOT EXAM

## 2020-07-10 DIAGNOSIS — R0600 Dyspnea, unspecified: Secondary | ICD-10-CM | POA: Diagnosis not present

## 2020-07-10 DIAGNOSIS — R0602 Shortness of breath: Secondary | ICD-10-CM | POA: Diagnosis not present

## 2020-07-10 DIAGNOSIS — R5383 Other fatigue: Secondary | ICD-10-CM | POA: Diagnosis not present

## 2020-07-15 ENCOUNTER — Other Ambulatory Visit: Payer: Self-pay

## 2020-07-15 ENCOUNTER — Other Ambulatory Visit (INDEPENDENT_AMBULATORY_CARE_PROVIDER_SITE_OTHER): Payer: Medicare Other

## 2020-07-15 DIAGNOSIS — E1165 Type 2 diabetes mellitus with hyperglycemia: Secondary | ICD-10-CM | POA: Diagnosis not present

## 2020-07-15 LAB — COMPREHENSIVE METABOLIC PANEL
ALT: 15 U/L (ref 0–35)
AST: 15 U/L (ref 0–37)
Albumin: 4.2 g/dL (ref 3.5–5.2)
Alkaline Phosphatase: 120 U/L — ABNORMAL HIGH (ref 39–117)
BUN: 9 mg/dL (ref 6–23)
CO2: 33 mEq/L — ABNORMAL HIGH (ref 19–32)
Calcium: 9.6 mg/dL (ref 8.4–10.5)
Chloride: 103 mEq/L (ref 96–112)
Creatinine, Ser: 0.71 mg/dL (ref 0.40–1.20)
GFR: 85.74 mL/min (ref >60.00)
Glucose, Bld: 130 mg/dL — ABNORMAL HIGH (ref 70–99)
Potassium: 3.9 mEq/L (ref 3.5–5.1)
Sodium: 143 mEq/L (ref 135–145)
Total Bilirubin: 0.7 mg/dL (ref 0.2–1.2)
Total Protein: 6.7 g/dL (ref 6.0–8.3)

## 2020-07-15 LAB — LIPID PANEL
Cholesterol: 143 mg/dL (ref 0–200)
HDL: 49.6 mg/dL (ref >39.00)
LDL Cholesterol: 72 mg/dL (ref 0–99)
NonHDL: 92.96
Total CHOL/HDL Ratio: 3
Triglycerides: 104 mg/dL (ref 0.0–149.0)
VLDL: 20.8 mg/dL (ref 0.0–40.0)

## 2020-07-15 LAB — HEMOGLOBIN A1C: Hgb A1c MFr Bld: 7.9 % — ABNORMAL HIGH (ref 4.6–6.5)

## 2020-07-16 NOTE — Progress Notes (Signed)
No critical labs need to be addressed urgently. We will discuss labs in detail at upcoming office visit.   

## 2020-07-19 ENCOUNTER — Ambulatory Visit (INDEPENDENT_AMBULATORY_CARE_PROVIDER_SITE_OTHER): Payer: Medicare Other | Admitting: Family Medicine

## 2020-07-19 ENCOUNTER — Other Ambulatory Visit: Payer: Self-pay

## 2020-07-19 ENCOUNTER — Encounter: Payer: Self-pay | Admitting: Family Medicine

## 2020-07-19 VITALS — BP 132/80 | HR 71 | Temp 97.9°F | Ht 66.5 in | Wt 235.5 lb

## 2020-07-19 DIAGNOSIS — E785 Hyperlipidemia, unspecified: Secondary | ICD-10-CM

## 2020-07-19 DIAGNOSIS — I152 Hypertension secondary to endocrine disorders: Secondary | ICD-10-CM

## 2020-07-19 DIAGNOSIS — Z23 Encounter for immunization: Secondary | ICD-10-CM | POA: Diagnosis not present

## 2020-07-19 DIAGNOSIS — E1165 Type 2 diabetes mellitus with hyperglycemia: Secondary | ICD-10-CM

## 2020-07-19 DIAGNOSIS — I25729 Atherosclerosis of autologous artery coronary artery bypass graft(s) with unspecified angina pectoris: Secondary | ICD-10-CM

## 2020-07-19 DIAGNOSIS — E1169 Type 2 diabetes mellitus with other specified complication: Secondary | ICD-10-CM

## 2020-07-19 DIAGNOSIS — E1159 Type 2 diabetes mellitus with other circulatory complications: Secondary | ICD-10-CM | POA: Diagnosis not present

## 2020-07-19 DIAGNOSIS — I10 Essential (primary) hypertension: Secondary | ICD-10-CM

## 2020-07-19 NOTE — Assessment & Plan Note (Signed)
Recent stress test normal. No clear cardiac cause of fatigue and DOE. More liekly deconditioning.

## 2020-07-19 NOTE — Assessment & Plan Note (Signed)
Well controlled. Continue current medication.  

## 2020-07-19 NOTE — Assessment & Plan Note (Signed)
Improving but not at goal.. discussed CGM but not covered by insurance.  Will follow CBGs more closely and verify she is low before using glucose tabs ( currently eating candy and soda if feeling tired) Keep up with physical activity.

## 2020-07-19 NOTE — Progress Notes (Signed)
Chief Complaint  Patient presents with  . Diabetes    History of Present Illness: HPI  69 year old female presents for follow up  Diabetes:   Inadequate but improving control on max glucotrol and 1000 mg metformin daily, as well as 500 mg at night. ( max tolerated dose, more =diarrhea). Lab Results  Component Value Date   HGBA1C 7.9 (H) 07/15/2020  Using medications without difficulties: Hypoglycemic episodes: Hyperglycemic episodes: Feet problems: Blood Sugars averaging: eye exam within last year:  She is walking.  Elevated Cholesterol:  At goal on atorvastatin. Lab Results  Component Value Date   CHOL 143 07/15/2020   HDL 49.60 07/15/2020   LDLCALC 72 07/15/2020   LDLDIRECT 63.3 11/22/2013   TRIG 104.0 07/15/2020   CHOLHDL 3 07/15/2020  Using medications without problems: Muscle aches:  Diet compliance: Exercise: Other complaints:  Wt Readings from Last 3 Encounters:  07/19/20 235 lb 8 oz (106.8 kg)  03/12/20 230 lb 12 oz (104.7 kg)  12/08/19 233 lb (105.7 kg)     Hypertension:    Good control on lisinopril, amlodipine BP Readings from Last 3 Encounters:  07/19/20 132/80  03/12/20 110/60  12/08/19 128/76  Using medication without problems or lightheadedness: none Chest pain with exertion:none Edema:none Short of breath: DOE Average home BPs: Other issues:   Reviewed recent cardiology OV.. reviewed ECHo, stress test, EKG and labs done at [facility]... all unremarkable.  This visit occurred during the SARS-CoV-2 public health emergency.  Safety protocols were in place, including screening questions prior to the visit, additional usage of staff PPE, and extensive cleaning of exam room while observing appropriate contact time as indicated for disinfecting solutions.   COVID 19 screen:  No recent travel or known exposure to COVID19 The patient denies respiratory symptoms of COVID 19 at this time. The importance of social distancing was discussed today.      Review of Systems  Constitutional: Negative for chills and fever.  HENT: Negative for congestion and ear pain.   Eyes: Negative for pain and redness.  Respiratory: Negative for cough and shortness of breath.   Cardiovascular: Negative for chest pain, palpitations and leg swelling.  Gastrointestinal: Negative for abdominal pain, blood in stool, constipation, diarrhea, nausea and vomiting.  Genitourinary: Negative for dysuria.  Musculoskeletal: Negative for falls and myalgias.  Skin: Negative for rash.  Neurological: Negative for dizziness.  Psychiatric/Behavioral: Negative for depression. The patient is not nervous/anxious.       Past Medical History:  Diagnosis Date  . Abdominal mass   . Abnormal coagulation profile   . Arthritis   . CAD (coronary artery disease)   . Diabetes mellitus type II, uncontrolled ([place])   . GERD (gastroesophageal reflux disease)   . Hyperlipidemia   . Hypertension   . Mesenteric mass 71/2458   complicated by nausea & vomiting  . Other postoperative infection   . S/P PICC central line placement 10/2008   line sepsis-right PICC line removed     reports that she has never smoked. She has never used smokeless tobacco. She reports that she does not drink alcohol and does not use drugs.   Current Outpatient Medications:  .  amLODipine (NORVASC) 10 MG tablet, Take 1 tablet (10 mg total) by mouth daily., Disp: 90 tablet, Rfl: 3 .  aspirin 81 MG tablet, Take 81 mg by mouth daily., Disp: , Rfl:  .  atorvastatin (LIPITOR) 40 MG tablet, Take 1 tablet (40 mg total) by mouth daily., Disp: 90 tablet,  Rfl: 3 .  Blood Glucose Monitoring Suppl (ONETOUCH VERIO IQ SYSTEM) w/Device KIT, , Disp: , Rfl:  .  Calcium Carbonate (CALCIUM 600 PO), Take 1 tablet by mouth daily., Disp: , Rfl:  .  Cholecalciferol (VITAMIN D3 PO), Take 2,000 Units by mouth daily., Disp: , Rfl:  .  glipiZIDE (GLUCOTROL XL) 10 MG 24 hr tablet, Take 1 tablet (10 mg total) by mouth daily with  breakfast., Disp: 90 tablet, Rfl: 3 .  glucose blood (ONETOUCH VERIO) test strip, USE TO CHECK BLOOD SUGAR DAILY  DX: E11.65, Disp: 100 each, Rfl: 3 .  Lancets (ONETOUCH ULTRASOFT) lancets, Use as instructed, Disp: 100 each, Rfl: 3 .  letrozole (FEMARA) 2.5 MG tablet, Take 2.5 mg by mouth daily., Disp: , Rfl:  .  lisinopril (ZESTRIL) 20 MG tablet, Take 1 tablet (20 mg total) by mouth daily., Disp: 90 tablet, Rfl: 3 .  Lysine HCl 500 MG TABS, Take 1 tablet (500 mg total) by mouth daily. (Patient taking differently: Take 500 mg by mouth daily as needed. ), Disp: 90 tablet, Rfl: 1 .  metFORMIN (GLUCOPHAGE-XR) 500 MG 24 hr tablet, Take 1,000 mg by mouth daily with breakfast. And 1 tablet by mouth in the evening, Disp: , Rfl:  .  nortriptyline (PAMELOR) 10 MG capsule, Take 1 capsule (10 mg total) by mouth at bedtime., Disp: 90 capsule, Rfl: 0 .  nystatin cream (MYCOSTATIN), APPLY TO AFFECTED AREA TWICE A DAY (Patient taking differently: Apply 1 application topically daily as needed. ), Disp: 30 g, Rfl: 1   Observations/Objective: Blood pressure 132/80, pulse 71, temperature 97.9 F (36.6 C), temperature source Temporal, height 5' 6.5" (1.689 m), weight 235 lb 8 oz (106.8 kg), SpO2 98 %.  Physical Exam Constitutional:      General: She is not in acute distress.    Appearance: Normal appearance. She is well-developed. She is obese. She is not ill-appearing or toxic-appearing.  HENT:     Head: Normocephalic.     Right Ear: Hearing, tympanic membrane, ear canal and external ear normal. Tympanic membrane is not erythematous, retracted or bulging.     Left Ear: Hearing, tympanic membrane, ear canal and external ear normal. Tympanic membrane is not erythematous, retracted or bulging.     Nose: No mucosal edema or rhinorrhea.     Right Sinus: No maxillary sinus tenderness or frontal sinus tenderness.     Left Sinus: No maxillary sinus tenderness or frontal sinus tenderness.     Mouth/Throat:      Pharynx: Uvula midline.  Eyes:     General: Lids are normal. Lids are everted, no foreign bodies appreciated.     Conjunctiva/sclera: Conjunctivae normal.     Pupils: Pupils are equal, round, and reactive to light.  Neck:     Thyroid: No thyroid mass or thyromegaly.     Vascular: No carotid bruit.     Trachea: Trachea normal.  Cardiovascular:     Rate and Rhythm: Normal rate and regular rhythm.     Pulses: Normal pulses.     Heart sounds: Normal heart sounds, S1 normal and S2 normal. No murmur heard.  No friction rub. No gallop.   Pulmonary:     Effort: Pulmonary effort is normal. No tachypnea or respiratory distress.     Breath sounds: Normal breath sounds. No decreased breath sounds, wheezing, rhonchi or rales.  Abdominal:     General: Bowel sounds are normal.     Palpations: Abdomen is soft.  Tenderness: There is no abdominal tenderness.  Musculoskeletal:     Cervical back: Normal range of motion and neck supple.  Skin:    General: Skin is warm and dry.     Findings: No rash.  Neurological:     Mental Status: She is alert.  Psychiatric:        Mood and Affect: Mood is not anxious or depressed.        Speech: Speech normal.        Behavior: Behavior normal. Behavior is cooperative.        Thought Content: Thought content normal.        Judgment: Judgment normal.     Diabetic foot exam: Normal inspection No skin breakdown No calluses  Normal DP pulses Normal sensation to light touch and monofilament Nails normal   Assessment and Plan   Hypertension associated with diabetes ([place]) Well controlled. Continue current medication.   Hyperlipidemia associated with type 2 diabetes mellitus ([place])  At goal on statin.  Diabetes mellitus type 2, uncontrolled Improving but not at goal.. discussed CGM but not covered by insurance.  Will follow CBGs more closely and verify she is low before using glucose tabs ( currently eating candy and soda if feeling tired) Keep up  with physical activity.  Atherosclerosis of autologous artery coronary artery bypass graft(s) with unspecified angina pectoris ([place]) Recent stress test normal. No clear cardiac cause of fatigue and DOE. More liekly deconditioning.     Eliezer Lofts, MD

## 2020-07-19 NOTE — Assessment & Plan Note (Signed)
At goal on statin 

## 2020-07-19 NOTE — Patient Instructions (Signed)
Follow blood sugars more closely. Wokr on increasing exercise,  Start low back and hip stretching.  Fasting blood sugar goal < 120, 2 hours after meals < 180. Too low < 60.. use a glucose tbalet and check blood sugar 15 min later. Conitnue metfomrin 1000 mg in AM and 500 mg in PM along with gluctrol XL.

## 2020-07-19 NOTE — Addendum Note (Signed)
Addended by: Damita Lack on: 07/19/2020 11:18 AM   Modules accepted: Orders

## 2020-07-24 DIAGNOSIS — G8929 Other chronic pain: Secondary | ICD-10-CM | POA: Diagnosis not present

## 2020-07-24 DIAGNOSIS — Z79811 Long term (current) use of aromatase inhibitors: Secondary | ICD-10-CM | POA: Diagnosis not present

## 2020-07-24 DIAGNOSIS — M1611 Unilateral primary osteoarthritis, right hip: Secondary | ICD-10-CM | POA: Diagnosis not present

## 2020-07-24 DIAGNOSIS — Z9189 Other specified personal risk factors, not elsewhere classified: Secondary | ICD-10-CM | POA: Diagnosis not present

## 2020-07-24 DIAGNOSIS — Z9011 Acquired absence of right breast and nipple: Secondary | ICD-10-CM | POA: Diagnosis not present

## 2020-07-24 DIAGNOSIS — M25551 Pain in right hip: Secondary | ICD-10-CM | POA: Diagnosis not present

## 2020-07-24 DIAGNOSIS — D0511 Intraductal carcinoma in situ of right breast: Secondary | ICD-10-CM | POA: Diagnosis not present

## 2020-10-07 DIAGNOSIS — E1165 Type 2 diabetes mellitus with hyperglycemia: Secondary | ICD-10-CM | POA: Diagnosis not present

## 2020-10-07 DIAGNOSIS — H25093 Other age-related incipient cataract, bilateral: Secondary | ICD-10-CM | POA: Diagnosis not present

## 2020-10-18 ENCOUNTER — Ambulatory Visit: Payer: Medicare Other | Admitting: Family Medicine

## 2020-11-18 ENCOUNTER — Other Ambulatory Visit: Payer: Self-pay

## 2020-11-19 ENCOUNTER — Encounter: Payer: Self-pay | Admitting: Family Medicine

## 2020-11-19 ENCOUNTER — Ambulatory Visit (INDEPENDENT_AMBULATORY_CARE_PROVIDER_SITE_OTHER): Payer: Medicare Other | Admitting: Family Medicine

## 2020-11-19 VITALS — BP 120/78 | HR 82 | Temp 97.3°F | Ht 66.5 in | Wt 235.0 lb

## 2020-11-19 DIAGNOSIS — E1159 Type 2 diabetes mellitus with other circulatory complications: Secondary | ICD-10-CM

## 2020-11-19 DIAGNOSIS — I872 Venous insufficiency (chronic) (peripheral): Secondary | ICD-10-CM

## 2020-11-19 DIAGNOSIS — I152 Hypertension secondary to endocrine disorders: Secondary | ICD-10-CM | POA: Diagnosis not present

## 2020-11-19 DIAGNOSIS — E1165 Type 2 diabetes mellitus with hyperglycemia: Secondary | ICD-10-CM | POA: Diagnosis not present

## 2020-11-19 LAB — POCT GLYCOSYLATED HEMOGLOBIN (HGB A1C): Hemoglobin A1C: 7.1 % — AB (ref 4.0–5.6)

## 2020-11-19 NOTE — Progress Notes (Signed)
Patient ID: Terri [name], female    DOB: 04/02/1951, 70 y.o.   MRN: [id number]  This visit was conducted in person.  BP 120/78 (BP Location: Left Arm, Patient Position: Sitting, Cuff Size: Normal)    Pulse 82    Temp (!) 97.3 F (36.3 C) (Temporal)    Ht 5' 6.5" (1.689 m)    Wt 235 lb (106.6 kg)    SpO2 98%    BMI 37.36 kg/m    CC:  Chief Complaint  Patient presents with   Diabetes    Subjective:   HPI: Terri [name] is a 70 y.o. female presenting on 11/19/2020 for Diabetes  Diabetes:   Improved control in last 3 months A1C decreased from 7.9 to 7.1 On metformin and glipizide.  Stopped candy and soda... improved diet . Walking at mall.Marland Kitchen 2-3 times a week. Lab Results  Component Value Date   HGBA1C 7.1 (A) 11/19/2020  Using medications without difficulties: Hypoglycemic episodes:none Hyperglycemic episodes:none Feet problems: no ulcers Blood Sugars averaging: FBS 130 eye exam within last year: yes, 1 month ago  Wt Readings from Last 3 Encounters:  11/19/20 235 lb (106.6 kg)  07/19/20 235 lb 8 oz (106.8 kg)  03/12/20 230 lb 12 oz (104.7 kg)   Hypertension:   Great control  Using medication without problems or lightheadedness: none Chest pain with exertion:none Edema: off and on... has noted prominence on left lower leg and skin color change. Short of breath:none Average home BPs: Other issues:       Relevant past medical, surgical, family and social history reviewed and updated as indicated. Interim medical history since our last visit reviewed. Allergies and medications reviewed and updated. Outpatient Medications Prior to Visit  Medication Sig Dispense Refill   amLODipine (NORVASC) 10 MG tablet Take 1 tablet (10 mg total) by mouth daily. 90 tablet 3   aspirin 81 MG tablet Take 81 mg by mouth daily.     atorvastatin (LIPITOR) 40 MG tablet Take 1 tablet (40 mg total) by mouth daily. 90 tablet 3   Blood Glucose Monitoring Suppl (ONETOUCH VERIO IQ  SYSTEM) w/Device KIT      Calcium Carbonate (CALCIUM 600 PO) Take 1 tablet by mouth daily.     Cholecalciferol (VITAMIN D3 PO) Take 2,000 Units by mouth daily.     glipiZIDE (GLUCOTROL XL) 10 MG 24 hr tablet Take 1 tablet (10 mg total) by mouth daily with breakfast. 90 tablet 3   glucose blood (ONETOUCH VERIO) test strip USE TO CHECK BLOOD SUGAR DAILY  DX: E11.65 100 each 3   Lancets (ONETOUCH ULTRASOFT) lancets Use as instructed 100 each 3   letrozole (FEMARA) 2.5 MG tablet Take 2.5 mg by mouth daily.     lisinopril (ZESTRIL) 20 MG tablet Take 1 tablet (20 mg total) by mouth daily. 90 tablet 3   Lysine HCl 500 MG TABS Take 1 tablet (500 mg total) by mouth daily. (Patient taking differently: Take 500 mg by mouth daily as needed.) 90 tablet 1   metFORMIN (GLUCOPHAGE-XR) 500 MG 24 hr tablet Take 1,000 mg by mouth daily with breakfast. And 1 tablet by mouth in the evening     nortriptyline (PAMELOR) 10 MG capsule Take 1 capsule (10 mg total) by mouth at bedtime. 90 capsule 0   nystatin cream (MYCOSTATIN) APPLY TO AFFECTED AREA TWICE A DAY (Patient taking differently: Apply 1 application topically daily as needed.) 30 g 1   No facility-administered medications prior to visit.  Per HPI unless specifically indicated in ROS section below Review of Systems  Constitutional: Negative for fatigue and fever.  HENT: Negative for congestion.   Eyes: Negative for pain.  Respiratory: Negative for cough and shortness of breath.   Cardiovascular: Negative for chest pain and palpitations.  Gastrointestinal: Negative for abdominal pain.  Genitourinary: Negative for dysuria and vaginal bleeding.  Musculoskeletal: Negative for back pain.  Neurological: Negative for syncope, light-headedness and headaches.  Psychiatric/Behavioral: Negative for dysphoric mood.   Objective:  BP 120/78 (BP Location: Left Arm, Patient Position: Sitting, Cuff Size: Normal)    Pulse 82    Temp (!) 97.3 F (36.3 C)  (Temporal)    Ht 5' 6.5" (1.689 m)    Wt 235 lb (106.6 kg)    SpO2 98%    BMI 37.36 kg/m   Wt Readings from Last 3 Encounters:  11/19/20 235 lb (106.6 kg)  07/19/20 235 lb 8 oz (106.8 kg)  03/12/20 230 lb 12 oz (104.7 kg)      Physical Exam Constitutional:      General: She is not in acute distress.Vital signs are normal.     Appearance: Normal appearance. She is well-developed and well-nourished. She is not ill-appearing or toxic-appearing.  HENT:     Head: Normocephalic.     Right Ear: Hearing, tympanic membrane, ear canal and external ear normal. Tympanic membrane is not erythematous, retracted or bulging.     Left Ear: Hearing, tympanic membrane, ear canal and external ear normal. Tympanic membrane is not erythematous, retracted or bulging.     Nose: No mucosal edema or rhinorrhea.     Right Sinus: No maxillary sinus tenderness or frontal sinus tenderness.     Left Sinus: No maxillary sinus tenderness or frontal sinus tenderness.     Mouth/Throat:     Mouth: Oropharynx is clear and moist and mucous membranes are normal.     Pharynx: Uvula midline.  Eyes:     General: Lids are normal. Lids are everted, no foreign bodies appreciated.     Extraocular Movements: EOM normal.     Conjunctiva/sclera: Conjunctivae normal.     Pupils: Pupils are equal, round, and reactive to light.  Neck:     Thyroid: No thyroid mass or thyromegaly.     Vascular: No carotid bruit.     Trachea: Trachea normal.  Cardiovascular:     Rate and Rhythm: Normal rate and regular rhythm.     Pulses: Normal pulses and intact distal pulses.     Heart sounds: Normal heart sounds, S1 normal and S2 normal. No murmur heard. No friction rub. No gallop.      Comments: Nonpitting edema, bilateral varicosities and chronic venous skin changes including hemosiderin deposition. Pulmonary:     Effort: Pulmonary effort is normal. No tachypnea or respiratory distress.     Breath sounds: Normal breath sounds. No decreased  breath sounds, wheezing, rhonchi or rales.  Abdominal:     General: Bowel sounds are normal.     Palpations: Abdomen is soft.     Tenderness: There is no abdominal tenderness.  Musculoskeletal:     Cervical back: Normal range of motion and neck supple.     Right lower leg: 1+ Pitting Edema present.     Left lower leg: 1+ Pitting Edema present.  Skin:    General: Skin is warm, dry and intact.     Findings: No rash.  Neurological:     Mental Status: She is alert.  Psychiatric:  Mood and Affect: Mood is not anxious or depressed.        Speech: Speech normal.        Behavior: Behavior normal. Behavior is cooperative.        Thought Content: Thought content normal.        Cognition and Memory: Cognition and memory normal.        Judgment: Judgment normal.       Results for orders placed or performed in visit on 11/19/20  POCT glycosylated hemoglobin (Hb A1C)  Result Value Ref Range   Hemoglobin A1C 7.1 (A) 4.0 - 5.6 %   HbA1c POC (<> result, manual entry)     HbA1c, POC (prediabetic range)     HbA1c, POC (controlled diabetic range)      This visit occurred during the SARS-CoV-2 public health emergency.  Safety protocols were in place, including screening questions prior to the visit, additional usage of staff PPE, and extensive cleaning of exam room while observing appropriate contact time as indicated for disinfecting solutions.   COVID 19 screen:  No recent travel or known exposure to COVID19 The patient denies respiratory symptoms of COVID 19 at this time. The importance of social distancing was discussed today.   Assessment and Plan Problem List Items Addressed This Visit    Chronic venous insufficiency (Chronic)    Exercise, elevated above heart, rx for compression hose given.      Hypertension associated with diabetes ([place]) (Chronic)    Stable, chronic.  Continue current medication.   Lisinopril 20 mg daily  Amlodipine 10 mg daily         Type 2 diabetes  mellitus with other circulatory complications HTN (HCC) - Primary (Chronic)    Improving control on glipizide and metformin. Bck on track with dietary changes. Keep up with regular exercise.            Eliezer Lofts, MD

## 2020-11-19 NOTE — Assessment & Plan Note (Signed)
Exercise, elevated above heart, rx for compression hose given.

## 2020-11-19 NOTE — Assessment & Plan Note (Signed)
Improving control on glipizide and metformin. Bck on track with dietary changes. Keep up with regular exercise.

## 2020-11-19 NOTE — Assessment & Plan Note (Signed)
Stable, chronic.  Continue current medication.   Lisinopril 20 mg daily  Amlodipine 10 mg daily

## 2020-11-19 NOTE — Patient Instructions (Signed)
Keep up great work on diet and exercise.  

## 2021-01-24 ENCOUNTER — Other Ambulatory Visit: Payer: Self-pay | Admitting: *Deleted

## 2021-01-24 MED ORDER — METFORMIN HCL ER 500 MG PO TB24: 1000.0000 mg | ORAL_TABLET | Freq: Every day | ORAL | 1 refills | Status: DC

## 2021-01-27 DIAGNOSIS — Z03818 Encounter for observation for suspected exposure to other biological agents ruled out: Secondary | ICD-10-CM | POA: Diagnosis not present

## 2021-02-11 ENCOUNTER — Telehealth: Payer: Self-pay | Admitting: Family Medicine

## 2021-02-11 DIAGNOSIS — E1165 Type 2 diabetes mellitus with hyperglycemia: Secondary | ICD-10-CM

## 2021-02-11 NOTE — Telephone Encounter (Signed)
-----   Message from Aquilla Solian, RT sent at 01/28/2021  1:43 PM EDT ----- Regarding: Lab Orders for Thursday 5.19.2022 Please place lab orders for Thursday 5.19.2022, 3 month f/u on Thursday 5.26.2022 Thank you, Jones Bales RT(R)

## 2021-02-13 ENCOUNTER — Other Ambulatory Visit: Payer: Self-pay

## 2021-02-13 ENCOUNTER — Other Ambulatory Visit (INDEPENDENT_AMBULATORY_CARE_PROVIDER_SITE_OTHER): Payer: Medicare Other

## 2021-02-13 DIAGNOSIS — E1165 Type 2 diabetes mellitus with hyperglycemia: Secondary | ICD-10-CM

## 2021-02-13 LAB — COMPREHENSIVE METABOLIC PANEL
ALT: 16 U/L (ref 0–35)
AST: 14 U/L (ref 0–37)
Albumin: 4.2 g/dL (ref 3.5–5.2)
Alkaline Phosphatase: 125 U/L — ABNORMAL HIGH (ref 39–117)
BUN: 9 mg/dL (ref 6–23)
CO2: 31 mEq/L (ref 19–32)
Calcium: 9.6 mg/dL (ref 8.4–10.5)
Chloride: 104 mEq/L (ref 96–112)
Creatinine, Ser: 0.75 mg/dL (ref 0.40–1.20)
GFR: 80.06 mL/min (ref >60.00)
Glucose, Bld: 146 mg/dL — ABNORMAL HIGH (ref 70–99)
Potassium: 4.3 mEq/L (ref 3.5–5.1)
Sodium: 142 mEq/L (ref 135–145)
Total Bilirubin: 0.7 mg/dL (ref 0.2–1.2)
Total Protein: 6.8 g/dL (ref 6.0–8.3)

## 2021-02-13 LAB — HEMOGLOBIN A1C: Hgb A1c MFr Bld: 8.1 % — ABNORMAL HIGH (ref 4.6–6.5)

## 2021-02-14 NOTE — Progress Notes (Signed)
No critical labs need to be addressed urgently. We will discuss labs in detail at upcoming office visit.   

## 2021-02-20 ENCOUNTER — Ambulatory Visit: Payer: Medicare Other | Admitting: Family Medicine

## 2021-02-25 ENCOUNTER — Ambulatory Visit: Payer: Medicare Other | Admitting: Family Medicine

## 2021-02-27 DIAGNOSIS — Z03818 Encounter for observation for suspected exposure to other biological agents ruled out: Secondary | ICD-10-CM | POA: Diagnosis not present

## 2021-02-28 ENCOUNTER — Encounter: Payer: Self-pay | Admitting: Family Medicine

## 2021-02-28 ENCOUNTER — Ambulatory Visit (INDEPENDENT_AMBULATORY_CARE_PROVIDER_SITE_OTHER): Payer: Medicare Other | Admitting: Family Medicine

## 2021-02-28 ENCOUNTER — Other Ambulatory Visit: Payer: Self-pay

## 2021-02-28 VITALS — BP 128/76 | HR 72 | Temp 97.8°F | Ht 66.5 in | Wt 237.0 lb

## 2021-02-28 DIAGNOSIS — R0609 Other forms of dyspnea: Secondary | ICD-10-CM

## 2021-02-28 DIAGNOSIS — R0600 Dyspnea, unspecified: Secondary | ICD-10-CM | POA: Diagnosis not present

## 2021-02-28 DIAGNOSIS — E1159 Type 2 diabetes mellitus with other circulatory complications: Secondary | ICD-10-CM

## 2021-02-28 DIAGNOSIS — I25729 Atherosclerosis of autologous artery coronary artery bypass graft(s) with unspecified angina pectoris: Secondary | ICD-10-CM | POA: Diagnosis not present

## 2021-02-28 DIAGNOSIS — Z6837 Body mass index (BMI) 37.0-37.9, adult: Secondary | ICD-10-CM

## 2021-02-28 MED ORDER — TRULICITY 0.75 MG/0.5ML ~~LOC~~ SOAJ: 0.7500 mg | SUBCUTANEOUS | 11 refills | Status: DC

## 2021-02-28 NOTE — Assessment & Plan Note (Signed)
Will start Trulicity  GLP1 RA given CVD benefit.

## 2021-02-28 NOTE — Patient Instructions (Signed)
Add Trulcitiy to regimen. Continue metformin and glipizide.  Check blood sugar daily.  Work on healthier eating habits, smaller potion size.

## 2021-02-28 NOTE — Assessment & Plan Note (Signed)
Neg stress testing and EHCO in 2021.  Normal labs.  Likely due to deconditioning and obesity. Work on [employer].

## 2021-02-28 NOTE — Progress Notes (Signed)
Patient ID: Terri [name], female    DOB: 1951-08-29, 70 y.o.   MRN: [id number]  This visit was conducted in person.  BP 128/76   Pulse 72   Temp 97.8 F (36.6 C) (Temporal)   Ht 5' 6.5" (1.689 m)   Wt 237 lb (107.5 kg)   SpO2 97%   BMI 37.68 kg/m    CC:  Chief Complaint  Patient presents with  . Diabetes    3 month follow up      Subjective:   HPI: Terri [name] is a 70 y.o. female presenting on 02/28/2021 for Diabetes (3 month follow up /)  Diabetes:  Significant worsening of control.. from 7.1  In 10/2020  on glipizide XL 10 mg daily and metformin XR 500 mg 2 tabs in AM and 1 tab in PM.  (max tolerated dose, more =diarrhea).  she reports she Is compliant with medication... but has not been keeping up with diet control.  She over ate in last month. Lab Results  Component Value Date   HGBA1C 8.1 (H) 02/13/2021  Using medications without difficulties: Hypoglycemic episodes:none Hyperglycemic episodes: none Feet problems: no  Blood Sugars averaging: FBS  eye exam within last year:  Wt Readings from Last 3 Encounters:  02/28/21 237 lb (107.5 kg)  11/19/20 235 lb (106.6 kg)  07/19/20 235 lb 8 oz (106.8 kg)         Relevant past medical, surgical, family and social history reviewed and updated as indicated. Interim medical history since our last visit reviewed. Allergies and medications reviewed and updated. Outpatient Medications Prior to Visit  Medication Sig Dispense Refill  . amLODipine (NORVASC) 10 MG tablet Take 1 tablet (10 mg total) by mouth daily. 90 tablet 3  . aspirin 81 MG tablet Take 81 mg by mouth daily.    Marland Kitchen atorvastatin (LIPITOR) 40 MG tablet Take 1 tablet (40 mg total) by mouth daily. 90 tablet 3  . Blood Glucose Monitoring Suppl (ONETOUCH VERIO IQ SYSTEM) w/Device KIT     . Calcium Carbonate (CALCIUM 600 PO) Take 1 tablet by mouth daily.    . Cholecalciferol (VITAMIN D3 PO) Take 2,000 Units by mouth daily.    Marland Kitchen glipiZIDE (GLUCOTROL XL) 10  MG 24 hr tablet Take 1 tablet (10 mg total) by mouth daily with breakfast. 90 tablet 3  . glucose blood (ONETOUCH VERIO) test strip USE TO CHECK BLOOD SUGAR DAILY  DX: E11.65 100 each 3  . Lancets (ONETOUCH ULTRASOFT) lancets Use as instructed 100 each 3  . letrozole (FEMARA) 2.5 MG tablet Take 2.5 mg by mouth daily.    Marland Kitchen lisinopril (ZESTRIL) 20 MG tablet Take 1 tablet (20 mg total) by mouth daily. 90 tablet 3  . Lysine HCl 500 MG TABS Take 1 tablet (500 mg total) by mouth daily. (Patient taking differently: Take 500 mg by mouth daily as needed.) 90 tablet 1  . metFORMIN (GLUCOPHAGE-XR) 500 MG 24 hr tablet Take 2 tablets (1,000 mg total) by mouth daily with breakfast. And 1 tablet by mouth in the evening 270 tablet 1  . nortriptyline (PAMELOR) 10 MG capsule Take 1 capsule (10 mg total) by mouth at bedtime. 90 capsule 0  . nystatin cream (MYCOSTATIN) APPLY TO AFFECTED AREA TWICE A DAY (Patient taking differently: Apply 1 application topically daily as needed.) 30 g 1   No facility-administered medications prior to visit.     Per HPI unless specifically indicated in ROS section below Review of Systems  Constitutional: Negative for fatigue and fever.  HENT: Negative for congestion.   Eyes: Negative for pain.  Respiratory: Negative for cough and shortness of breath.   Cardiovascular: Negative for chest pain, palpitations and leg swelling.  Gastrointestinal: Negative for abdominal pain.  Genitourinary: Negative for dysuria and vaginal bleeding.  Musculoskeletal: Negative for back pain.  Neurological: Negative for syncope, light-headedness and headaches.  Psychiatric/Behavioral: Negative for dysphoric mood.   Objective:  BP 128/76   Pulse 72   Temp 97.8 F (36.6 C) (Temporal)   Ht 5' 6.5" (1.689 m)   Wt 237 lb (107.5 kg)   SpO2 97%   BMI 37.68 kg/m   Wt Readings from Last 3 Encounters:  02/28/21 237 lb (107.5 kg)  11/19/20 235 lb (106.6 kg)  07/19/20 235 lb 8 oz (106.8 kg)       Physical Exam Constitutional:      General: She is not in acute distress.    Appearance: Normal appearance. She is well-developed. She is not ill-appearing or toxic-appearing.  HENT:     Head: Normocephalic.     Right Ear: Hearing, tympanic membrane, ear canal and external ear normal. Tympanic membrane is not erythematous, retracted or bulging.     Left Ear: Hearing, tympanic membrane, ear canal and external ear normal. Tympanic membrane is not erythematous, retracted or bulging.     Nose: No mucosal edema or rhinorrhea.     Right Sinus: No maxillary sinus tenderness or frontal sinus tenderness.     Left Sinus: No maxillary sinus tenderness or frontal sinus tenderness.     Mouth/Throat:     Pharynx: Uvula midline.  Eyes:     General: Lids are normal. Lids are everted, no foreign bodies appreciated.     Conjunctiva/sclera: Conjunctivae normal.     Pupils: Pupils are equal, round, and reactive to light.  Neck:     Thyroid: No thyroid mass or thyromegaly.     Vascular: No carotid bruit.     Trachea: Trachea normal.  Cardiovascular:     Rate and Rhythm: Normal rate and regular rhythm.     Pulses: Normal pulses.     Heart sounds: Normal heart sounds, S1 normal and S2 normal. No murmur heard. No friction rub. No gallop.      Comments: Bilateral varicosities and mild peripheral edema Pulmonary:     Effort: Pulmonary effort is normal. No tachypnea or respiratory distress.     Breath sounds: Normal breath sounds. No decreased breath sounds, wheezing, rhonchi or rales.  Abdominal:     General: Bowel sounds are normal.     Palpations: Abdomen is soft.     Tenderness: There is no abdominal tenderness.  Musculoskeletal:     Cervical back: Normal range of motion and neck supple.     Right lower leg: 1+ Edema present.     Left lower leg: 1+ Edema present.  Skin:    General: Skin is warm and dry.     Findings: No rash.  Neurological:     Mental Status: She is alert.  Psychiatric:         Mood and Affect: Mood is not anxious or depressed.        Speech: Speech normal.        Behavior: Behavior normal. Behavior is cooperative.        Thought Content: Thought content normal.        Judgment: Judgment normal.       Results for orders placed or performed  in visit on 02/13/21  Comprehensive metabolic panel  Result Value Ref Range   Sodium 142 135 - 145 mEq/L   Potassium 4.3 3.5 - 5.1 mEq/L   Chloride 104 96 - 112 mEq/L   CO2 31 19 - 32 mEq/L   Glucose, Bld 146 (H) 70 - 99 mg/dL   BUN 9 6 - 23 mg/dL   Creatinine, Ser 0.75 0.40 - 1.20 mg/dL   Total Bilirubin 0.7 0.2 - 1.2 mg/dL   Alkaline Phosphatase 125 (H) 39 - 117 U/L   AST 14 0 - 37 U/L   ALT 16 0 - 35 U/L   Total Protein 6.8 6.0 - 8.3 g/dL   Albumin 4.2 3.5 - 5.2 g/dL   GFR 80.06 >60.00 mL/min   Calcium 9.6 8.4 - 10.5 mg/dL  Hemoglobin A1c  Result Value Ref Range   Hgb A1c MFr Bld 8.1 (H) 4.6 - 6.5 %    This visit occurred during the SARS-CoV-2 public health emergency.  Safety protocols were in place, including screening questions prior to the visit, additional usage of staff PPE, and extensive cleaning of exam room while observing appropriate contact time as indicated for disinfecting solutions.   COVID 19 screen:  No recent travel or known exposure to COVID19 The patient denies respiratory symptoms of COVID 19 at this time. The importance of social distancing was discussed today.   Assessment and Plan    Problem List Items Addressed This Visit    Atherosclerosis of autologous artery coronary artery bypass graft(s) with unspecified angina pectoris (HCC) (Chronic)    Will start Trulicity  GLP1 RA given CVD benefit.       Class 2 severe obesity with serious comorbidity and body mass index (BMI) of 37.0 to 37.9 in adult [facility])    Encouraged exercise, weight loss, healthy eating habits.  Start trulicity.      Relevant Medications   Dulaglutide (TRULICITY) 6.14 JW/9.2HV SOPN   DOE (dyspnea on exertion)     Neg stress testing and EHCO in 2021.  Normal labs.  Likely due to deconditioning and obesity. Work on [employer].      Type 2 diabetes mellitus with other circulatory complications HTN (HCC) - Primary (Chronic)     Poor control... will add trulicity weekly injection to her regimen with plan of stopping glipizide as control improves.  Follow up in 2 weeks for re-eval and to discuss med tolerance.      Relevant Medications   Dulaglutide (TRULICITY) 7.47 BU/0.3JQ SOPN       Eliezer Lofts, MD

## 2021-02-28 NOTE — Assessment & Plan Note (Signed)
Encouraged exercise, weight loss, healthy eating habits.  Start trulicity.

## 2021-02-28 NOTE — Assessment & Plan Note (Signed)
Poor control... will add trulicity weekly injection to her regimen with plan of stopping glipizide as control improves.  Follow up in 2 weeks for re-eval and to discuss med tolerance.

## 2021-03-03 ENCOUNTER — Encounter: Payer: Self-pay | Admitting: Family Medicine

## 2021-03-03 ENCOUNTER — Telehealth: Payer: Self-pay

## 2021-03-03 ENCOUNTER — Telehealth (INDEPENDENT_AMBULATORY_CARE_PROVIDER_SITE_OTHER): Payer: Medicare Other | Admitting: Family Medicine

## 2021-03-03 VITALS — Temp 96.9°F | Ht 66.5 in | Wt 237.0 lb

## 2021-03-03 DIAGNOSIS — U071 COVID-19: Secondary | ICD-10-CM | POA: Diagnosis not present

## 2021-03-03 MED ORDER — NIRMATRELVIR/RITONAVIR (PAXLOVID)TABLET: 3.0000 | ORAL_TABLET | Freq: Two times a day (BID) | ORAL | 0 refills | Status: AC

## 2021-03-03 NOTE — Progress Notes (Signed)
    I connected with Terri [name] on 03/03/21 at 12:20 PM EDT by video and verified that I am speaking with the correct person using two identifiers.   I discussed the limitations, risks, security and privacy concerns of performing an evaluation and management service by video and the availability of in person appointments. I also discussed with the patient that there may be a patient responsible charge related to this service. The patient expressed understanding and agreed to proceed.  Patient location: Home Provider Location: [place] [facility] Participants: Terri [name] and Terri [name]   Subjective:     Terri [name] is a 70 y.o. female presenting for Covid Positive, Cough, Sore Throat, Headache, and Nasal Congestion     HPI   #Covid  - 02/28/2021 - chills and 100 degree temperature - using mucinex w/ some improvement - made a citrus drink which helps as well   Review of Systems  Constitutional: Positive for chills and fever.  HENT: Positive for sore throat.   Respiratory: Positive for cough.   Neurological: Positive for headaches.     Social History   Tobacco Use  Smoking Status Never Smoker  Smokeless Tobacco Never Used        Objective:   BP Readings from Last 3 Encounters:  02/28/21 128/76  11/19/20 120/78  07/19/20 132/80   Wt Readings from Last 3 Encounters:  03/03/21 237 lb (107.5 kg)  02/28/21 237 lb (107.5 kg)  11/19/20 235 lb (106.6 kg)    Temp (!) 96.9 F (36.1 C) (Temporal)   Ht 5' 6.5" (1.689 m)   Wt 237 lb (107.5 kg)   BMI 37.68 kg/m    Physical Exam Constitutional:      Appearance: Normal appearance. She is not ill-appearing.  HENT:     Head: Normocephalic and atraumatic.     Right Ear: External ear normal.     Left Ear: External ear normal.  Eyes:     Conjunctiva/sclera: Conjunctivae normal.  Pulmonary:     Effort: Pulmonary effort is normal. No respiratory distress.  Neurological:     Mental Status: She is  alert. Mental status is at baseline.  Psychiatric:        Mood and Affect: Mood normal.        Behavior: Behavior normal.        Thought Content: Thought content normal.        Judgment: Judgment normal.            Assessment & Plan:   Problem List Items Addressed This Visit   None   Visit Diagnoses    COVID-19 virus infection    -  Primary   Relevant Medications   nirmatrelvir/ritonavir EUA (PAXLOVID) TABS      Pt at risk for severe covid - DM, Breast Cancer on letrozole, HTN, CAD, Obesity - discussed she is eligible and would benefit from paxlovid.   Normal liver/kidney function  Hold statin and start paxlovid tonight Reviewed isolation guidelines ER precautions and return precautions Symptomatic care   Return if symptoms worsen or fail to improve.  Terri Child, MD

## 2021-03-03 NOTE — Telephone Encounter (Signed)
Tested pos on 03/01/21 morning and had fever at 100 at the time but afebrile now. Only symptoms now are deep cough w/clear phlegm, chest congestion, and sore throat.   Scheduled pt for VV with Dr. Selena Batten today. Pt agreed to visit but has not done one before. She is going to try to do one through mychart.

## 2021-03-03 NOTE — Telephone Encounter (Signed)
PLEASE NOTE: All timestamps contained within this report are represented as [place] Standard Time. CONFIDENTIALTY NOTICE: This fax transmission is intended only for the addressee. It contains information that is legally privileged, confidential or otherwise protected from use or disclosure. If you are not the intended recipient, you are strictly prohibited from reviewing, disclosing, copying using or disseminating any of this information or taking any action in reliance on or regarding this information. If you have received this fax in error, please notify us immediately by telephone so that we can arrange for its return to [place]. Phone: [phone], Toll-Free: [phone], Fax: [phone] Page: 1 of 3 Call Id: [id number] [facility] Night - Client TELEPHONE ADVICE RECORD [facility] Patient Name: Terri [name] Gender: Female DOB: May 15, 1951 Age: 70 Y 6 M 17 D Return Phone Number: [phone] (Primary), [phone] (Secondary) Address: City/ State/ Zip: [place] [address] Client [facility] Night - Client Client Site [facility] - Night Physician Kerby Nora - MD Contact Type Call Who Is Calling Patient / Member / Family / Caregiver Call Type Triage / Clinical Caller Name Lissa Merlin Relationship To Patient Daughter Return Phone Number [phone] (Primary) Chief Complaint Headache Reason for Call Symptomatic / Request for Health Information Initial Comment Caller states her mother has Covid and her mother sounds congestion, sore throat, headache, and cough. Caller states her mother just tested on a home test yesterday. Caller states her mother has a history of breast cancer, diabetes and high blood pressure. Translation No Nurse Assessment Nurse: Felipa Furnace, RN, Tannia Date/Time (Eastern Time): 03/02/2021 9:07:11 AM Confirm and document reason for call. If symptomatic, describe symptoms. ---Caller  states her mother has Covid and her mother sounds congestion, sore throat, headache, and cough. Caller states her mother just tested on a home test yesterday. Caller states her mother has a history of breast cancer, diabetes and high blood pressure. Denies fever today. Symptoms started Friday night. Does the patient have any new or worsening symptoms? ---Yes Will a triage be completed? ---Yes Related visit to physician within the last 2 weeks? ---No Does the PT have any chronic conditions? (i.e. diabetes, asthma, this includes High risk factors for pregnancy, etc.) ---Yes List chronic conditions. ---has a history of breast cancer, diabetes and high blood pressure. Is this a behavioral health or substance abuse call? ---No PLEASE NOTE: All timestamps contained within this report are represented as [place] Standard Time. CONFIDENTIALTY NOTICE: This fax transmission is intended only for the addressee. It contains information that is legally privileged, confidential or otherwise protected from use or disclosure. If you are not the intended recipient, you are strictly prohibited from reviewing, disclosing, copying using or disseminating any of this information or taking any action in reliance on or regarding this information. If you have received this fax in error, please notify us immediately by telephone so that we can arrange for its return to [place]. Phone: [phone], Toll-Free: [phone], Fax: [phone] Page: 2 of 3 Call Id: [id number] Guidelines Guideline Title Affirmed Question Affirmed Notes Nurse Date/Time Lamount Cohen Time) COVID-19 - Diagnosed or Suspected [1] HIGH RISK for severe COVID complications (e.g., weak immune system, age > 64 years, obesity with BMI > 25, pregnant, chronic lung disease or other chronic medical condition) AND [2] COVID symptoms (e.g., cough, fever) (Exceptions: Already seen by PCP and no new or worsening symptoms.) Felipa Furnace, RN, Tannia  03/02/2021 9:08:36 AM Disp. Time Lamount Cohen Time) Disposition Final User 03/02/2021 9:22:17 AM Called On-Call Provider Felipa Furnace,  RN, Steffanie Dunn 03/02/2021 9:12:27 AM Call PCP Now Yes Felipa Furnace, RN, Francetta Found Disagree/Comply Comply Caller Understands Yes PreDisposition Did not know what to do Care Advice Given Per Guideline * I'll page the on-call provider now. If you haven't heard from the provider (or me) within 30 minutes, call again. CALL BACK IF: * You become worse CARE ADVICE given per COVID-19 - DIAGNOSED OR SUSPECTED (Adult) guideline. Comments User: Sherlynn Stalls, RN Date/Time Lamount Cohen Time): 03/02/2021 9:31:36 AM Spoke with patient and let her know someone from the clinic will call her early tomorrow. If she does not hear from anyone to please call office. PLEASE NOTE: All timestamps contained within this report are represented as [place] Standard Time. CONFIDENTIALTY NOTICE: This fax transmission is intended only for the addressee. It contains information that is legally privileged, confidential or otherwise protected from use or disclosure. If you are not the intended recipient, you are strictly prohibited from reviewing, disclosing, copying using or disseminating any of this information or taking any action in reliance on or regarding this information. If you have received this fax in error, please notify us immediately by telephone so that we can arrange for its return to [place]. Phone: [phone], Toll-Free: [phone], Fax: [phone] Page: 3 of 3 Call Id: [id number] Paging DoctorName Phone DateTime Result/ Outcome Message Type Notes Raechel Ache - MD [phone] 03/02/2021 9:22:17 AM Called On Call Provider - Reached Doctor Paged Raechel Ache - MD 03/02/2021 9:24:43 AM Spoke with On Call - General Message Result Updated on call provider of patient condition. Asks to let her know someone from the clinic will call her tomorrow morning, and for her to please call if she has not  been called. Also to let him know if she needs anything otc for cough or symptoms for today. She also needs to been seen today for extreme symptoms like SOB.

## 2021-03-03 NOTE — Telephone Encounter (Signed)
-----   Message from Joaquim Nam, MD sent at 03/02/2021  9:20 AM EDT ----- Regarding: please schedule on Monday. Patient called in Sunday.  Positive covid test.  Monday will be still <5 days of symptoms.  Please schedule video/phone visit.  I saw that Selena Batten had open slots.  Many thanks.    Clelia Croft

## 2021-03-04 NOTE — Telephone Encounter (Signed)
See note from 03/03/2021

## 2021-03-12 DIAGNOSIS — Z03818 Encounter for observation for suspected exposure to other biological agents ruled out: Secondary | ICD-10-CM | POA: Diagnosis not present

## 2021-03-13 DIAGNOSIS — Z86000 Personal history of in-situ neoplasm of breast: Secondary | ICD-10-CM | POA: Diagnosis not present

## 2021-03-13 DIAGNOSIS — Z1231 Encounter for screening mammogram for malignant neoplasm of breast: Secondary | ICD-10-CM | POA: Diagnosis not present

## 2021-03-13 DIAGNOSIS — D0511 Intraductal carcinoma in situ of right breast: Secondary | ICD-10-CM | POA: Diagnosis not present

## 2021-03-14 ENCOUNTER — Ambulatory Visit: Payer: Medicare Other | Admitting: Family Medicine

## 2021-03-28 ENCOUNTER — Other Ambulatory Visit: Payer: Self-pay | Admitting: Family Medicine

## 2021-03-28 MED ORDER — LISINOPRIL 20 MG PO TABS: 20.0000 mg | ORAL_TABLET | Freq: Every day | ORAL | 0 refills | Status: DC

## 2021-03-28 MED ORDER — GLIPIZIDE ER 10 MG PO TB24: 10.0000 mg | ORAL_TABLET | Freq: Every day | ORAL | 0 refills | Status: DC

## 2021-03-28 MED ORDER — AMLODIPINE BESYLATE 10 MG PO TABS: 10.0000 mg | ORAL_TABLET | Freq: Every day | ORAL | 0 refills | Status: DC

## 2021-03-28 NOTE — Addendum Note (Signed)
Addended by: Damita Lack on: 03/28/2021 12:59 PM   Modules accepted: Orders

## 2021-03-28 NOTE — Telephone Encounter (Addendum)
Pt left v/m that she has not received her meds from mail order pharmacy yet and request refill of Amlodipine 10 mg,Lisinopril 20 mg and [facility]izide [facility] [address] [facility] [place] until can get mail order prescriptions.

## 2021-03-28 NOTE — Telephone Encounter (Signed)
Refills sent as requested

## 2021-04-17 ENCOUNTER — Encounter: Payer: Self-pay | Admitting: Family Medicine

## 2021-04-17 ENCOUNTER — Ambulatory Visit (INDEPENDENT_AMBULATORY_CARE_PROVIDER_SITE_OTHER): Payer: Medicare Other | Admitting: Family Medicine

## 2021-04-17 ENCOUNTER — Other Ambulatory Visit: Payer: Self-pay

## 2021-04-17 DIAGNOSIS — E1165 Type 2 diabetes mellitus with hyperglycemia: Secondary | ICD-10-CM

## 2021-04-17 DIAGNOSIS — E1169 Type 2 diabetes mellitus with other specified complication: Secondary | ICD-10-CM | POA: Diagnosis not present

## 2021-04-17 DIAGNOSIS — E1159 Type 2 diabetes mellitus with other circulatory complications: Secondary | ICD-10-CM

## 2021-04-17 DIAGNOSIS — I152 Hypertension secondary to endocrine disorders: Secondary | ICD-10-CM

## 2021-04-17 DIAGNOSIS — E785 Hyperlipidemia, unspecified: Secondary | ICD-10-CM

## 2021-04-17 LAB — POCT GLYCOSYLATED HEMOGLOBIN (HGB A1C): Hemoglobin A1C: 7.1 % — AB (ref 4.0–5.6)

## 2021-04-17 MED ORDER — ONETOUCH ULTRASOFT LANCETS MISC: 3 refills | Status: DC

## 2021-04-17 MED ORDER — ATORVASTATIN CALCIUM 40 MG PO TABS: 40.0000 mg | ORAL_TABLET | Freq: Every day | ORAL | 3 refills | Status: DC

## 2021-04-17 MED ORDER — ONETOUCH VERIO VI STRP: ORAL_STRIP | 3 refills | Status: AC

## 2021-04-17 MED ORDER — TRULICITY 0.75 MG/0.5ML ~~LOC~~ SOAJ: 0.7500 mg | SUBCUTANEOUS | 3 refills | Status: DC

## 2021-04-17 NOTE — Patient Instructions (Signed)
Continue current dose of Trulicity weekly. Continue metformin  XR 500 mg twice daily.  If seeing frequent fasting blood sugars < 80.. hold glipizide.

## 2021-04-17 NOTE — Assessment & Plan Note (Signed)
Stable, chronic.  Continue current medication.    lisinopril 20 mg daily  amlodipine 10 mg daily

## 2021-04-17 NOTE — Assessment & Plan Note (Signed)
Refilled statin medication.

## 2021-04-17 NOTE — Assessment & Plan Note (Signed)
Improving control with trulicity.  Continue 0.75 mg weekly dose as well as metformin. If FBS < 80 frequently stop glipizide. Re-eval in 3 motnhs.

## 2021-04-17 NOTE — Progress Notes (Signed)
Patient ID: Terri [name], female    DOB: September 26, 1951, 70 y.o.   MRN: [id number]  This visit was conducted in person.  BP 130/80   Pulse 72   Temp 97.8 F (36.6 C) (Temporal)   Ht 5' 6.5" (1.689 m)   Wt 235 lb 8 oz (106.8 kg)   SpO2 98%   BMI 37.44 kg/m    CC: Chief Complaint  Patient presents with   Follow-up    Trulicity Start     Subjective:   HPI: Terri [name] is a 70 y.o. female presenting on 04/17/2021 for Follow-up (Trulicity Start/)  At last OV started on Trulicity  for DM control and weight management.  She has taken 3 weeks.  Cost was manageable.  No SE to the medication   Metformin  XR 500 mg BID and glipizide    FBS 77-146... 111...Marland Kitchen postprandial < 180. Wt Readings from Last 3 Encounters:  04/17/21 235 lb 8 oz (106.8 kg)  03/03/21 237 lb (107.5 kg)  02/28/21 237 lb (107.5 kg)   She is due for A1C repeat.     Relevant past medical, surgical, family and social history reviewed and updated as indicated. Interim medical history since our last visit reviewed. Allergies and medications reviewed and updated. Outpatient Medications Prior to Visit  Medication Sig Dispense Refill   amLODipine (NORVASC) 10 MG tablet Take 1 tablet (10 mg total) by mouth daily. 30 tablet 0   aspirin 81 MG tablet Take 81 mg by mouth daily.     atorvastatin (LIPITOR) 40 MG tablet Take 1 tablet (40 mg total) by mouth daily. 90 tablet 3   Blood Glucose Monitoring Suppl (ONETOUCH VERIO IQ SYSTEM) w/Device KIT      Calcium Carbonate (CALCIUM 600 PO) Take 1 tablet by mouth daily.     Cholecalciferol (VITAMIN D3 PO) Take 2,000 Units by mouth daily.     Dulaglutide (TRULICITY) 3.55 HR/4.1UL SOPN Inject 0.75 mg into the skin once a week. 3 mL 11   glipiZIDE (GLUCOTROL XL) 10 MG 24 hr tablet Take 1 tablet (10 mg total) by mouth daily with breakfast. 30 tablet 0   glucose blood (ONETOUCH VERIO) test strip USE TO CHECK BLOOD SUGAR DAILY  DX: E11.65 100 each 3   Lancets (ONETOUCH  ULTRASOFT) lancets Use as instructed 100 each 3   letrozole (FEMARA) 2.5 MG tablet Take 2.5 mg by mouth daily.     lisinopril (ZESTRIL) 20 MG tablet Take 1 tablet (20 mg total) by mouth daily. 30 tablet 0   Lysine HCl 500 MG TABS Take 1 tablet (500 mg total) by mouth daily. (Patient taking differently: Take 500 mg by mouth daily as needed.) 90 tablet 1   metFORMIN (GLUCOPHAGE-XR) 500 MG 24 hr tablet Take 2 tablets (1,000 mg total) by mouth daily with breakfast. And 1 tablet by mouth in the evening 270 tablet 1   nortriptyline (PAMELOR) 10 MG capsule Take 1 capsule (10 mg total) by mouth at bedtime. 90 capsule 0   nystatin cream (MYCOSTATIN) APPLY TO AFFECTED AREA TWICE A DAY (Patient taking differently: Apply 1 application topically daily as needed.) 30 g 1   No facility-administered medications prior to visit.     Per HPI unless specifically indicated in ROS section below Review of Systems  Constitutional:  Negative for fatigue and fever.  HENT:  Negative for congestion.   Eyes:  Negative for pain.  Respiratory:  Negative for cough and shortness of breath.  Cardiovascular:  Negative for chest pain, palpitations and leg swelling.  Gastrointestinal:  Negative for abdominal pain.  Genitourinary:  Negative for dysuria and vaginal bleeding.  Musculoskeletal:  Negative for back pain.  Neurological:  Negative for syncope, light-headedness and headaches.  Psychiatric/Behavioral:  Negative for dysphoric mood.   Objective:  BP 130/80   Pulse 72   Temp 97.8 F (36.6 C) (Temporal)   Ht 5' 6.5" (1.689 m)   Wt 235 lb 8 oz (106.8 kg)   SpO2 98%   BMI 37.44 kg/m   Wt Readings from Last 3 Encounters:  04/17/21 235 lb 8 oz (106.8 kg)  03/03/21 237 lb (107.5 kg)  02/28/21 237 lb (107.5 kg)      Physical Exam Constitutional:      General: She is not in acute distress.    Appearance: Normal appearance. She is well-developed. She is obese. She is not ill-appearing or toxic-appearing.  HENT:      Head: Normocephalic.     Right Ear: Hearing, tympanic membrane, ear canal and external ear normal. Tympanic membrane is not erythematous, retracted or bulging.     Left Ear: Hearing, tympanic membrane, ear canal and external ear normal. Tympanic membrane is not erythematous, retracted or bulging.     Nose: No mucosal edema or rhinorrhea.     Right Sinus: No maxillary sinus tenderness or frontal sinus tenderness.     Left Sinus: No maxillary sinus tenderness or frontal sinus tenderness.     Mouth/Throat:     Pharynx: Uvula midline.  Eyes:     General: Lids are normal. Lids are everted, no foreign bodies appreciated.     Conjunctiva/sclera: Conjunctivae normal.     Pupils: Pupils are equal, round, and reactive to light.  Neck:     Thyroid: No thyroid mass or thyromegaly.     Vascular: No carotid bruit.     Trachea: Trachea normal.  Cardiovascular:     Rate and Rhythm: Normal rate and regular rhythm.     Pulses: Normal pulses.     Heart sounds: Normal heart sounds, S1 normal and S2 normal. No murmur heard.   No friction rub. No gallop.  Pulmonary:     Effort: Pulmonary effort is normal. No tachypnea or respiratory distress.     Breath sounds: Normal breath sounds. No decreased breath sounds, wheezing, rhonchi or rales.  Abdominal:     General: Bowel sounds are normal.     Palpations: Abdomen is soft.     Tenderness: There is no abdominal tenderness.  Musculoskeletal:     Cervical back: Normal range of motion and neck supple.  Skin:    General: Skin is warm and dry.     Findings: No rash.  Neurological:     Mental Status: She is alert.  Psychiatric:        Mood and Affect: Mood is not anxious or depressed.        Speech: Speech normal.        Behavior: Behavior normal. Behavior is cooperative.        Thought Content: Thought content normal.        Judgment: Judgment normal.      Results for orders placed or performed in visit on 02/13/21  Comprehensive metabolic panel   Result Value Ref Range   Sodium 142 135 - 145 mEq/L   Potassium 4.3 3.5 - 5.1 mEq/L   Chloride 104 96 - 112 mEq/L   CO2 31 19 - 32 mEq/L  Glucose, Bld 146 (H) 70 - 99 mg/dL   BUN 9 6 - 23 mg/dL   Creatinine, Ser 0.75 0.40 - 1.20 mg/dL   Total Bilirubin 0.7 0.2 - 1.2 mg/dL   Alkaline Phosphatase 125 (H) 39 - 117 U/L   AST 14 0 - 37 U/L   ALT 16 0 - 35 U/L   Total Protein 6.8 6.0 - 8.3 g/dL   Albumin 4.2 3.5 - 5.2 g/dL   GFR 80.06 >60.00 mL/min   Calcium 9.6 8.4 - 10.5 mg/dL  Hemoglobin A1c  Result Value Ref Range   Hgb A1c MFr Bld 8.1 (H) 4.6 - 6.5 %    This visit occurred during the SARS-CoV-2 public health emergency.  Safety protocols were in place, including screening questions prior to the visit, additional usage of staff PPE, and extensive cleaning of exam room while observing appropriate contact time as indicated for disinfecting solutions.   COVID 19 screen:  No recent travel or known exposure to COVID19 The patient denies respiratory symptoms of COVID 19 at this time. The importance of social distancing was discussed today.   Assessment and Plan Problem List Items Addressed This Visit     Hyperlipidemia associated with type 2 diabetes mellitus ([place]) (Chronic)    Refilled statin medication.       Relevant Medications   atorvastatin (LIPITOR) 40 MG tablet   Dulaglutide (TRULICITY) 7.62 GB/1.5VV SOPN   Hypertension associated with diabetes (HCC) (Chronic)    Stable, chronic.  Continue current medication.    lisinopril 20 mg daily  amlodipine 10 mg daily        Relevant Medications   atorvastatin (LIPITOR) 40 MG tablet   Dulaglutide (TRULICITY) 6.16 WV/3.7TG SOPN   Type 2 diabetes mellitus with other circulatory complications HTN (HCC) (Chronic)    Improving control with trulicity.  Continue 0.75 mg weekly dose as well as metformin. If FBS < 80 frequently stop glipizide. Re-eval in 3 motnhs.       Relevant Medications   atorvastatin (LIPITOR) 40 MG  tablet   Lancets (ONETOUCH ULTRASOFT) lancets   Dulaglutide (TRULICITY) 6.26 RS/8.5IO SOPN   Other Visit Diagnoses     Uncontrolled type 2 diabetes mellitus with hyperglycemia (HCC)       Relevant Medications   atorvastatin (LIPITOR) 40 MG tablet   Lancets (ONETOUCH ULTRASOFT) lancets   Dulaglutide (TRULICITY) 2.70 JJ/0.0XF SOPN   Other Relevant Orders   POCT glycosylated hemoglobin (Hb A1C) (Completed)          Eliezer Lofts, MD

## 2021-04-18 MED ORDER — ONETOUCH ULTRASOFT LANCETS MISC
3 refills | Status: AC
Start: 2021-04-18 — End: ?

## 2021-04-18 NOTE — Addendum Note (Signed)
Addended by: Damita Lack on: 04/18/2021 11:05 AM   Modules accepted: Orders

## 2021-05-13 ENCOUNTER — Telehealth: Payer: Self-pay | Admitting: *Deleted

## 2021-05-13 NOTE — Telephone Encounter (Signed)
Responded via Mychart.

## 2021-05-13 NOTE — Telephone Encounter (Signed)
Patient left a voicemail stating that she is getting ready to go on vacation. Patient wants to know if it is okay for her not to take her truliciy for a few days or does she need to figure out a way to take it.

## 2021-06-09 ENCOUNTER — Telehealth: Payer: Self-pay | Admitting: Family Medicine

## 2021-06-09 NOTE — Telephone Encounter (Signed)
LVM for pt to rtn my call to schedule AWV with [facility]. Please schedule AWV with [facility] if pt calls the office.  

## 2021-07-02 ENCOUNTER — Telehealth: Payer: Self-pay | Admitting: Family Medicine

## 2021-07-02 DIAGNOSIS — E1165 Type 2 diabetes mellitus with hyperglycemia: Secondary | ICD-10-CM

## 2021-07-02 NOTE — Telephone Encounter (Signed)
-----   Message from Alvina Chou sent at 06/19/2021  3:24 PM EDT ----- Regarding: Lab orders for Thursday. 10.13.22 Lab orders for f/u

## 2021-07-11 ENCOUNTER — Other Ambulatory Visit (INDEPENDENT_AMBULATORY_CARE_PROVIDER_SITE_OTHER): Payer: Medicare Other

## 2021-07-11 ENCOUNTER — Other Ambulatory Visit: Payer: Self-pay

## 2021-07-11 DIAGNOSIS — E1165 Type 2 diabetes mellitus with hyperglycemia: Secondary | ICD-10-CM | POA: Diagnosis not present

## 2021-07-11 LAB — LIPID PANEL
Cholesterol: 128 mg/dL (ref 0–200)
HDL: 50.5 mg/dL (ref >39.00)
LDL Cholesterol: 62 mg/dL (ref 0–99)
NonHDL: 77.91
Total CHOL/HDL Ratio: 3
Triglycerides: 79 mg/dL (ref 0.0–149.0)
VLDL: 15.8 mg/dL (ref 0.0–40.0)

## 2021-07-11 LAB — COMPREHENSIVE METABOLIC PANEL
ALT: 14 U/L (ref 0–35)
AST: 16 U/L (ref 0–37)
Albumin: 4.3 g/dL (ref 3.5–5.2)
Alkaline Phosphatase: 118 U/L — ABNORMAL HIGH (ref 39–117)
BUN: 7 mg/dL (ref 6–23)
CO2: 32 mEq/L (ref 19–32)
Calcium: 10 mg/dL (ref 8.4–10.5)
Chloride: 103 mEq/L (ref 96–112)
Creatinine, Ser: 0.72 mg/dL (ref 0.40–1.20)
GFR: 83.84 mL/min (ref >60.00)
Glucose, Bld: 117 mg/dL — ABNORMAL HIGH (ref 70–99)
Potassium: 4.4 mEq/L (ref 3.5–5.1)
Sodium: 142 mEq/L (ref 135–145)
Total Bilirubin: 0.7 mg/dL (ref 0.2–1.2)
Total Protein: 6.7 g/dL (ref 6.0–8.3)

## 2021-07-11 LAB — HEMOGLOBIN A1C: Hgb A1c MFr Bld: 6.9 % — ABNORMAL HIGH (ref 4.6–6.5)

## 2021-07-11 NOTE — Progress Notes (Signed)
No critical labs need to be addressed urgently. We will discuss labs in detail at upcoming office visit.   

## 2021-07-15 ENCOUNTER — Telehealth: Payer: Self-pay | Admitting: Family Medicine

## 2021-07-15 NOTE — Telephone Encounter (Signed)
Spoke with Terri [name].  She states she is currently in the donut hole and her Trulicity is costing $497 for a 3 month supply.  She is wondering if it is okay to stop this medication until the first of the year.   Terri [name] states [facility] if there is a [facility] card they can offer to make the Trulicity more affordable over the next 2 months.  Please advise about patient stopping medication until new year.

## 2021-07-15 NOTE — Telephone Encounter (Signed)
Pt called in stating that the medication dulaglutide is to expensive and Insurance will not pay for it anymore. Pt takes her last injection on Sunday of this medication. Pt wants to be called regarding this

## 2021-07-15 NOTE — Telephone Encounter (Signed)
It would be okay but not ideal to hold the Trulicity. I am assuming all meds in same family would fall in donut hole as well.  If she is agreeable, I can see if Phil Dopp can help with coverage at all.  Let me know if she would like a referral to pharmacy for med assistance options.

## 2021-07-16 NOTE — Telephone Encounter (Signed)
Left message for Terri [name] to return my call in regards to her Trulicity.

## 2021-07-17 MED ORDER — TRULICITY 0.75 MG/0.5ML ~~LOC~~ SOAJ: 0.7500 mg | SUBCUTANEOUS | 2 refills | Status: DC

## 2021-07-17 NOTE — Telephone Encounter (Signed)
Spoke with Mrs. Boreman.  She states [facility] and he thought he could get her a 30 day supply for $74.  She wants to try to go that route.  [facility] [address] in [place].  Patient will call me back if she has any issues. FYI to Dr. Ermalene Searing.

## 2021-07-18 ENCOUNTER — Ambulatory Visit: Payer: Medicare Other | Admitting: Family Medicine

## 2021-07-24 ENCOUNTER — Ambulatory Visit (INDEPENDENT_AMBULATORY_CARE_PROVIDER_SITE_OTHER): Payer: Medicare Other | Admitting: Family Medicine

## 2021-07-24 ENCOUNTER — Other Ambulatory Visit: Payer: Self-pay

## 2021-07-24 ENCOUNTER — Encounter: Payer: Self-pay | Admitting: Family Medicine

## 2021-07-24 ENCOUNTER — Ambulatory Visit: Payer: Medicare Other | Admitting: Family Medicine

## 2021-07-24 VITALS — BP 134/82 | HR 73 | Temp 97.5°F | Ht 66.5 in | Wt 234.0 lb

## 2021-07-24 DIAGNOSIS — Z23 Encounter for immunization: Secondary | ICD-10-CM

## 2021-07-24 DIAGNOSIS — E1169 Type 2 diabetes mellitus with other specified complication: Secondary | ICD-10-CM

## 2021-07-24 DIAGNOSIS — E785 Hyperlipidemia, unspecified: Secondary | ICD-10-CM

## 2021-07-24 DIAGNOSIS — E1159 Type 2 diabetes mellitus with other circulatory complications: Secondary | ICD-10-CM

## 2021-07-24 DIAGNOSIS — I152 Hypertension secondary to endocrine disorders: Secondary | ICD-10-CM

## 2021-07-24 NOTE — Patient Instructions (Addendum)
Continue Trulicity at 0.75 mg weekly, as well as glipizide and metformin.  Next year when out of donut hole we can consider higher dose of Trulicity for weight loss. Set up yearly eye exam for diabetes and have the opthalmologist send [place] a copy of the evaluation for the chart.  Get back on track with low carb diet and work on regular exercise.

## 2021-07-24 NOTE — Progress Notes (Signed)
Patient ID: Terri [name], female    DOB: 07/30/1951, 70 y.o.   MRN: [id number]  This visit was conducted in person.  BP 134/82   Pulse 73   Temp (!) 97.5 F (36.4 C) (Temporal)   Ht 5' 6.5" (1.689 m)   Wt 234 lb (106.1 kg)   SpO2 98%   BMI 37.20 kg/m    CC:  Chief Complaint  Patient presents with   Diabetes    Subjective:   HPI: Terri [name] is a 70 y.o. female presenting on 07/24/2021 for Diabetes  Diabetes:  On 9.45 trulicity as well as metformin and glipizide SE to higher dose metfomrin.  Trouble with donut hole and cost of Trulicity. Lab Results  Component Value Date   HGBA1C 6.9 (H) 07/11/2021  Using medications without difficulties: Hypoglycemic episodes:  If  skips a meal Hyperglycemic episodes: none Feet problems: no ulcers. Blood Sugars averaging: FBS 117-140 eye exam within last year: Wt Readings from Last 3 Encounters:  07/24/21 234 lb (106.1 kg)  04/17/21 235 lb 8 oz (106.8 kg)  03/03/21 237 lb (107.5 kg)    Elevated Cholesterol:  At [place] on atorvastatin 40 mg daily Lab Results  Component Value Date   CHOL 128 07/11/2021   HDL 50.50 07/11/2021   LDLCALC 62 07/11/2021   LDLDIRECT 63.3 11/22/2013   TRIG 79.0 07/11/2021   CHOLHDL 3 07/11/2021    Using medications without problems: Muscle aches:  Diet compliance: good Exercise: yes Other complaints:  Hypertension:   At goal  BP Readings from Last 3 Encounters:  07/24/21 134/82  04/17/21 130/80  02/28/21 128/76    Using medication without problems or lightheadedness: none Chest pain with exertion:none Edema:none Short of breath:none Average home BPs: Other issues:      Relevant past medical, surgical, family and social history reviewed and updated as indicated. Interim medical history since our last visit reviewed. Allergies and medications reviewed and updated. Outpatient Medications Prior to Visit  Medication Sig Dispense Refill   amLODipine (NORVASC) 10 MG tablet Take  1 tablet (10 mg total) by mouth daily. 30 tablet 0   aspirin 81 MG tablet Take 81 mg by mouth daily.     atorvastatin (LIPITOR) 40 MG tablet Take 1 tablet (40 mg total) by mouth daily. 90 tablet 3   Blood Glucose Monitoring Suppl (ONETOUCH VERIO IQ SYSTEM) w/Device KIT      Calcium Carbonate (CALCIUM 600 PO) Take 1 tablet by mouth daily.     Cholecalciferol (VITAMIN D3 PO) Take 2,000 Units by mouth daily.     Dulaglutide (TRULICITY) 8.59 YT/2.4MQ SOPN Inject 0.75 mg into the skin once a week. 2 mL 2   glipiZIDE (GLUCOTROL XL) 10 MG 24 hr tablet Take 1 tablet (10 mg total) by mouth daily with breakfast. 30 tablet 0   glucose blood (ONETOUCH VERIO) test strip USE TO CHECK BLOOD SUGAR DAILY  DX: E11.65 100 each 3   Lancets (ONETOUCH ULTRASOFT) lancets USE TO CHECK FASTING BLOOD SUGAR DAILY  DX: E11.65 100 each 3   letrozole (FEMARA) 2.5 MG tablet Take 2.5 mg by mouth daily.     lisinopril (ZESTRIL) 20 MG tablet Take 1 tablet (20 mg total) by mouth daily. 30 tablet 0   Lysine 500 MG TABS Take 1 tablet by mouth daily as needed.     metFORMIN (GLUCOPHAGE-XR) 500 MG 24 hr tablet Take 1,000 mg by mouth daily.     nortriptyline (PAMELOR) 10 MG capsule Take  1 capsule (10 mg total) by mouth at bedtime. 90 capsule 0   nystatin cream (MYCOSTATIN) Apply 1 application topically 2 (two) times daily as needed for dry skin.     No facility-administered medications prior to visit.     Per HPI unless specifically indicated in ROS section below Review of Systems  Constitutional:  Negative for fatigue and fever.  HENT:  Negative for congestion.   Eyes:  Negative for pain.  Respiratory:  Negative for cough and shortness of breath.   Cardiovascular:  Negative for chest pain, palpitations and leg swelling.  Gastrointestinal:  Negative for abdominal pain.  Genitourinary:  Negative for dysuria and vaginal bleeding.  Musculoskeletal:  Negative for back pain.  Neurological:  Negative for syncope, light-headedness  and headaches.  Psychiatric/Behavioral:  Negative for dysphoric mood.   Objective:  BP 134/82   Pulse 73   Temp (!) 97.5 F (36.4 C) (Temporal)   Ht 5' 6.5" (1.689 m)   Wt 234 lb (106.1 kg)   SpO2 98%   BMI 37.20 kg/m   Wt Readings from Last 3 Encounters:  07/24/21 234 lb (106.1 kg)  04/17/21 235 lb 8 oz (106.8 kg)  03/03/21 237 lb (107.5 kg)      Physical Exam Constitutional:      General: She is not in acute distress.    Appearance: Normal appearance. She is well-developed. She is obese. She is not ill-appearing or toxic-appearing.  HENT:     Head: Normocephalic.     Right Ear: Hearing, tympanic membrane, ear canal and external ear normal. Tympanic membrane is not erythematous, retracted or bulging.     Left Ear: Hearing, tympanic membrane, ear canal and external ear normal. Tympanic membrane is not erythematous, retracted or bulging.     Nose: No mucosal edema or rhinorrhea.     Right Sinus: No maxillary sinus tenderness or frontal sinus tenderness.     Left Sinus: No maxillary sinus tenderness or frontal sinus tenderness.     Mouth/Throat:     Pharynx: Uvula midline.  Eyes:     General: Lids are normal. Lids are everted, no foreign bodies appreciated.     Conjunctiva/sclera: Conjunctivae normal.     Pupils: Pupils are equal, round, and reactive to light.  Neck:     Thyroid: No thyroid mass or thyromegaly.     Vascular: No carotid bruit.     Trachea: Trachea normal.  Cardiovascular:     Rate and Rhythm: Normal rate and regular rhythm.     Pulses: Normal pulses.     Heart sounds: Normal heart sounds, S1 normal and S2 normal. No murmur heard.   No friction rub. No gallop.  Pulmonary:     Effort: Pulmonary effort is normal. No tachypnea or respiratory distress.     Breath sounds: Normal breath sounds. No decreased breath sounds, wheezing, rhonchi or rales.  Abdominal:     General: Bowel sounds are normal.     Palpations: Abdomen is soft.     Tenderness: There is no  abdominal tenderness.  Musculoskeletal:     Cervical back: Normal range of motion and neck supple.  Skin:    General: Skin is warm and dry.     Findings: No rash.  Neurological:     Mental Status: She is alert.  Psychiatric:        Mood and Affect: Mood is not anxious or depressed.        Speech: Speech normal.  Behavior: Behavior normal. Behavior is cooperative.        Thought Content: Thought content normal.        Judgment: Judgment normal.      Results for orders placed or performed in visit on 07/11/21  Comprehensive metabolic panel  Result Value Ref Range   Sodium 142 135 - 145 mEq/L   Potassium 4.4 3.5 - 5.1 mEq/L   Chloride 103 96 - 112 mEq/L   CO2 32 19 - 32 mEq/L   Glucose, Bld 117 (H) 70 - 99 mg/dL   BUN 7 6 - 23 mg/dL   Creatinine, Ser 0.72 0.40 - 1.20 mg/dL   Total Bilirubin 0.7 0.2 - 1.2 mg/dL   Alkaline Phosphatase 118 (H) 39 - 117 U/L   AST 16 0 - 37 U/L   ALT 14 0 - 35 U/L   Total Protein 6.7 6.0 - 8.3 g/dL   Albumin 4.3 3.5 - 5.2 g/dL   GFR 83.84 >60.00 mL/min   Calcium 10.0 8.4 - 10.5 mg/dL  Lipid panel  Result Value Ref Range   Cholesterol 128 0 - 200 mg/dL   Triglycerides 79.0 0.0 - 149.0 mg/dL   HDL 50.50 >39.00 mg/dL   VLDL 15.8 0.0 - 40.0 mg/dL   LDL Cholesterol 62 0 - 99 mg/dL   Total CHOL/HDL Ratio 3    NonHDL 77.91   Hemoglobin A1c  Result Value Ref Range   Hgb A1c MFr Bld 6.9 (H) 4.6 - 6.5 %    This visit occurred during the SARS-CoV-2 public health emergency.  Safety protocols were in place, including screening questions prior to the visit, additional usage of staff PPE, and extensive cleaning of exam room while observing appropriate contact time as indicated for disinfecting solutions.   COVID 19 screen:  No recent travel or known exposure to COVID19 The patient denies respiratory symptoms of COVID 19 at this time. The importance of social distancing was discussed today.   Assessment and Plan     Eliezer Lofts, MD

## 2021-08-14 NOTE — Assessment & Plan Note (Signed)
Stable, chronic.  Continue current medication.   Atorvastatin 40 mg daily. 

## 2021-08-14 NOTE — Assessment & Plan Note (Signed)
Stable, chronic.  Continue current medication.  Lisinopril 20 mg daily. 

## 2021-08-14 NOTE — Assessment & Plan Note (Signed)
Continue Trulicity at 0.75 mg weekly, as well as glipizide and metformin.  Next year when out of donut hole we can consider higher dose of Trulicity for weight loss. Set up yearly eye exam for diabetes and have the opthalmologist send [place] a copy of the evaluation for the chart.  Get back on track with low carb diet and work on regular exercise.

## 2021-08-25 ENCOUNTER — Telehealth: Payer: Self-pay | Admitting: *Deleted

## 2021-08-25 NOTE — Telephone Encounter (Signed)
Received fax from [facility] requesting PA for Trulicity 0.75 mg/0.5 ml. PA completed on CoverMyMeds and approved. Effective from 08/25/2021 through 08/25/2022.   [facility] notified of approval via fax.

## 2021-09-12 ENCOUNTER — Telehealth: Payer: Self-pay | Admitting: Family Medicine

## 2021-09-12 MED ORDER — TRULICITY 0.75 MG/0.5ML ~~LOC~~ SOAJ: 0.7500 mg | SUBCUTANEOUS | 0 refills | Status: DC

## 2021-09-12 NOTE — Telephone Encounter (Signed)
I am trying to help offload paperwork this afternoon.  If a patient calls in for a prescription refill, please note the prescription name and their pharmacy.  A returned phone call directly to the patient for clarification is rarely needed.  I will cc Dr. Ermalene Searing, since it is Friday afternoon.

## 2021-09-12 NOTE — Telephone Encounter (Signed)
Pt called in requesting a call back to discuss RX refill stated she's down to one pill and need RX called in  . Please advise [phone]

## 2021-09-12 NOTE — Telephone Encounter (Signed)
Rx already sent in for 30 day supply as was requested

## 2021-09-12 NOTE — Telephone Encounter (Signed)
Called patient. She was not available but I spoke with her husband-James, ok per DPR on file. Per Fayrene Fearing due to patient been in donut hole she wanted to get 30 day supply of Trulicity to mail order pharmacy, they already spoke with mail order and got a quote on the price. Then in the beginning of January patient will call back and let us know when she is ready for 90 day supply to be sent to mail order.  FYI to Dr Ermalene Searing

## 2021-10-06 DIAGNOSIS — H25093 Other age-related incipient cataract, bilateral: Secondary | ICD-10-CM | POA: Diagnosis not present

## 2021-10-06 DIAGNOSIS — E1165 Type 2 diabetes mellitus with hyperglycemia: Secondary | ICD-10-CM | POA: Diagnosis not present

## 2021-10-06 LAB — HM DIABETES EYE EXAM

## 2021-10-09 ENCOUNTER — Telehealth: Payer: Self-pay | Admitting: Family Medicine

## 2021-10-09 ENCOUNTER — Encounter: Payer: Self-pay | Admitting: Family Medicine

## 2021-10-09 MED ORDER — TRULICITY 0.75 MG/0.5ML ~~LOC~~ SOAJ: 0.7500 mg | SUBCUTANEOUS | 1 refills | Status: DC

## 2021-10-09 NOTE — Telephone Encounter (Signed)
Refill sent as requested. 

## 2021-10-09 NOTE — Telephone Encounter (Signed)
1.Medication Requested: Dulaglutide (TRULICITY) 0.75 MG/0.5ML SOPN  2. Pharmacy (Name, Street, [place]):  Chief Executive Officer [facility]) [facility] - [place], [place] - [address] Phone:  [phone]  Fax:  [phone]      3. On Med List: Y  4. Last Visit with PCP: 10.27.22  5. Next visit date with PCP: 02.02.23  **Patient is requesting a 3 mos refill since it is the first of the year and benefits started over

## 2021-10-27 LAB — HM DIABETES FOOT EXAM

## 2021-10-29 ENCOUNTER — Other Ambulatory Visit: Payer: Self-pay | Admitting: Family Medicine

## 2021-10-30 ENCOUNTER — Ambulatory Visit (INDEPENDENT_AMBULATORY_CARE_PROVIDER_SITE_OTHER): Payer: Medicare Other | Admitting: Family Medicine

## 2021-10-30 ENCOUNTER — Encounter: Payer: Self-pay | Admitting: Family Medicine

## 2021-10-30 ENCOUNTER — Other Ambulatory Visit: Payer: Self-pay

## 2021-10-30 VITALS — BP 120/70 | HR 73 | Temp 98.0°F | Ht 66.5 in | Wt 228.2 lb

## 2021-10-30 DIAGNOSIS — E1159 Type 2 diabetes mellitus with other circulatory complications: Secondary | ICD-10-CM

## 2021-10-30 DIAGNOSIS — Z6837 Body mass index (BMI) 37.0-37.9, adult: Secondary | ICD-10-CM

## 2021-10-30 DIAGNOSIS — M7061 Trochanteric bursitis, right hip: Secondary | ICD-10-CM | POA: Diagnosis not present

## 2021-10-30 DIAGNOSIS — E66812 Obesity, class 2: Secondary | ICD-10-CM

## 2021-10-30 LAB — POCT GLYCOSYLATED HEMOGLOBIN (HGB A1C): Hemoglobin A1C: 7.2 % — AB (ref 4.0–5.6)

## 2021-10-30 MED ORDER — NYSTATIN 100000 UNIT/GM EX CREA: 1.0000 "application " | TOPICAL_CREAM | Freq: Two times a day (BID) | CUTANEOUS | 0 refills | Status: AC | PRN

## 2021-10-30 NOTE — Assessment & Plan Note (Signed)
Chronic, slightly worse off glipizide.  Recommended increasing Trulicity to 1.5 mg weekly but they want to wait on refill until when she is out of this dose in next 2 months.  They will call for refills at higher dose level ( 1.5 mg weekly)  Continue metformin and re-eval in 3 months.   If needed at that time we can refer to pharmacy for med assistance.

## 2021-10-30 NOTE — Assessment & Plan Note (Signed)
6 lb weight loss on GLP1. Encouraged exercise, weight loss, healthy eating habits.

## 2021-10-30 NOTE — Progress Notes (Signed)
Patient ID: Terri [name], female    DOB: [birth year], 71 y.o.   MRN: [id number]  This visit was conducted in person.  BP 120/70    Pulse 73    Temp 98 F (36.7 C) (Temporal)    Ht 5' 6.5" (1.689 m)    Wt 228 lb 4 oz (103.5 kg)    SpO2 96%    BMI 36.29 kg/m    CC:  Chief Complaint  Patient presents with   Diabetes    Subjective:   HPI: Terri [name] is a 71 y.o. female presenting on 10/30/2021 for Diabetes  Diabetes: Slightly worse control  on Trulicity, and metformin  XR 1000 mg daily in the last 3 months. Lab Results  Component Value Date   HGBA1C 7.2 (A) 10/30/2021  Using medications without difficulties: Hypoglycemic episodes: Hyperglycemic episodes: Feet problems: no ulcers Blood Sugars averaging: eye exam within last year:   Walking at mall 2 times a week.  Right lateral hip pain interferes with walking, especially when going up stairs.  No better with OTC meds.    She has lost 6 lbs in last 4 months. Wt Readings from Last 3 Encounters:  10/30/21 228 lb 4 oz (103.5 kg)  07/24/21 234 lb (106.1 kg)  04/17/21 235 lb 8 oz (106.8 kg)   Hypertension:   Good control on lisinopril 20 mg daily, amlodipine 10 mg daily BP Readings from Last 3 Encounters:  10/30/21 120/70  07/24/21 134/82  04/17/21 130/80  Using medication without problems or lightheadedness:  none Chest pain with exertion: none Edema: none Short of breath: none Average home BPs: Other issues:       Relevant past medical, surgical, family and social history reviewed and updated as indicated. Interim medical history since our last visit reviewed. Allergies and medications reviewed and updated. Outpatient Medications Prior to Visit  Medication Sig Dispense Refill   amLODipine (NORVASC) 10 MG tablet Take 1 tablet (10 mg total) by mouth daily. 30 tablet 0   aspirin 81 MG tablet Take 81 mg by mouth daily.     atorvastatin (LIPITOR) 40 MG tablet Take 1 tablet (40 mg total) by mouth daily. 90  tablet 3   Blood Glucose Monitoring Suppl (ONETOUCH VERIO IQ SYSTEM) w/Device KIT      Calcium Carbonate (CALCIUM 600 PO) Take 1 tablet by mouth daily.     Cholecalciferol (VITAMIN D3 PO) Take 2,000 Units by mouth daily.     Dulaglutide (TRULICITY) 3.21 YY/4.8GN SOPN Inject 0.75 mg into the skin once a week. 6 mL 1   glucose blood (ONETOUCH VERIO) test strip USE TO CHECK BLOOD SUGAR DAILY  DX: E11.65 100 each 3   Lancets (ONETOUCH ULTRASOFT) lancets USE TO CHECK FASTING BLOOD SUGAR DAILY  DX: E11.65 100 each 3   lisinopril (ZESTRIL) 20 MG tablet Take 1 tablet (20 mg total) by mouth daily. 30 tablet 0   Lysine 500 MG TABS Take 1 tablet by mouth daily as needed.     metFORMIN (GLUCOPHAGE-XR) 500 MG 24 hr tablet Take 2 tablets (1,000 mg total) by mouth daily with breakfast. 180 tablet 1   nortriptyline (PAMELOR) 10 MG capsule Take 1 capsule (10 mg total) by mouth at bedtime. 90 capsule 0   nystatin cream (MYCOSTATIN) Apply 1 application topically 2 (two) times daily as needed for dry skin.     glipiZIDE (GLUCOTROL XL) 10 MG 24 hr tablet Take 1 tablet (10 mg total) by mouth daily with breakfast.  30 tablet 0   letrozole (FEMARA) 2.5 MG tablet Take 2.5 mg by mouth daily.     No facility-administered medications prior to visit.     Per HPI unless specifically indicated in ROS section below Review of Systems  Constitutional:  Negative for fatigue and fever.  HENT:  Negative for ear pain.   Eyes:  Negative for pain.  Respiratory:  Negative for chest tightness and shortness of breath.   Cardiovascular:  Negative for chest pain, palpitations and leg swelling.  Gastrointestinal:  Negative for abdominal pain.  Genitourinary:  Negative for dysuria.  Objective:  BP 120/70    Pulse 73    Temp 98 F (36.7 C) (Temporal)    Ht 5' 6.5" (1.689 m)    Wt 228 lb 4 oz (103.5 kg)    SpO2 96%    BMI 36.29 kg/m   Wt Readings from Last 3 Encounters:  10/30/21 228 lb 4 oz (103.5 kg)  07/24/21 234 lb (106.1 kg)   04/17/21 235 lb 8 oz (106.8 kg)      Physical Exam Constitutional:      General: She is not in acute distress.    Appearance: Normal appearance. She is well-developed. She is not ill-appearing or toxic-appearing.  HENT:     Head: Normocephalic.     Right Ear: Hearing, tympanic membrane, ear canal and external ear normal. Tympanic membrane is not erythematous, retracted or bulging.     Left Ear: Hearing, tympanic membrane, ear canal and external ear normal. Tympanic membrane is not erythematous, retracted or bulging.     Nose: No mucosal edema or rhinorrhea.     Right Sinus: No maxillary sinus tenderness or frontal sinus tenderness.     Left Sinus: No maxillary sinus tenderness or frontal sinus tenderness.     Mouth/Throat:     Pharynx: Uvula midline.  Eyes:     General: Lids are normal. Lids are everted, no foreign bodies appreciated.     Conjunctiva/sclera: Conjunctivae normal.     Pupils: Pupils are equal, round, and reactive to light.  Neck:     Thyroid: No thyroid mass or thyromegaly.     Vascular: No carotid bruit.     Trachea: Trachea normal.  Cardiovascular:     Rate and Rhythm: Normal rate and regular rhythm.     Pulses: Normal pulses.     Heart sounds: Normal heart sounds, S1 normal and S2 normal. No murmur heard.   No friction rub. No gallop.  Pulmonary:     Effort: Pulmonary effort is normal. No tachypnea or respiratory distress.     Breath sounds: Normal breath sounds. No decreased breath sounds, wheezing, rhonchi or rales.  Abdominal:     General: Bowel sounds are normal.     Palpations: Abdomen is soft.     Tenderness: There is no abdominal tenderness.  Musculoskeletal:     Cervical back: Normal range of motion and neck supple.  Skin:    General: Skin is warm and dry.     Findings: No rash.  Neurological:     Mental Status: She is alert.  Psychiatric:        Mood and Affect: Mood is not anxious or depressed.        Speech: Speech normal.        Behavior:  Behavior normal. Behavior is cooperative.        Thought Content: Thought content normal.        Judgment: Judgment normal.  Diabetic foot exam: Normal inspection No skin breakdown No calluses  Normal DP pulses Normal sensation to light touch and monofilament Nails normal  Results for orders placed or performed in visit on 10/30/21  POCT glycosylated hemoglobin (Hb A1C)  Result Value Ref Range   Hemoglobin A1C 7.2 (A) 4.0 - 5.6 %   HbA1c POC (<> result, manual entry)     HbA1c, POC (prediabetic range)     HbA1c, POC (controlled diabetic range)      This visit occurred during the SARS-CoV-2 public health emergency.  Safety protocols were in place, including screening questions prior to the visit, additional usage of staff PPE, and extensive cleaning of exam room while observing appropriate contact time as indicated for disinfecting solutions.   COVID 19 screen:  No recent travel or known exposure to COVID19 The patient denies respiratory symptoms of COVID 19 at this time. The importance of social distancing was discussed today.   Assessment and Plan    Problem List Items Addressed This Visit     Type 2 diabetes mellitus with other circulatory complications HTN ([place]) - Primary (Chronic)      Chronic, slightly worse off glipizide.  Recommended increasing Trulicity to 1.5 mg weekly but they want to wait on refill until when she is out of this dose in next 2 months.  They will call for refills at higher dose level ( 1.5 mg weekly)  Continue metformin and re-eval in 3 months.   If needed at that time we can refer to pharmacy for med assistance.      Relevant Orders   POCT glycosylated hemoglobin (Hb A1C) (Completed)   Class 2 severe obesity with serious comorbidity and body mass index (BMI) of 37.0 to 37.9 in adult (HCC)    6 lb weight loss on GLP1. Encouraged exercise, weight loss, healthy eating habits.       Trochanteric bursitis of right hip    Start home PT, info  given. Use ibuprofen prn, reviewed SE profile and encouraged her to not use longterm.     Requested refill for candida, none currently. Meds ordered this encounter  Medications   nystatin cream (MYCOSTATIN)    Sig: Apply 1 application topically 2 (two) times daily as needed for dry skin.    Dispense:  30 g    Refill:  0     Eliezer Lofts, MD

## 2021-10-30 NOTE — Assessment & Plan Note (Signed)
Start home PT, info given. Use ibuprofen prn, reviewed SE profile and encouraged her to not use longterm.

## 2021-10-30 NOTE — Patient Instructions (Addendum)
Start home stretching for trochanteric bursitis.  Use tylenol for pain.. can use ibuprofen  for short time iif really flaring up.  Can use glucosamine and turmeric  for decreased in inflammation.  Add exercise and continue working on low carb , high protein and fiber diet.   We will plan on increasing Trulicity to 1.5 mg weekly at next refill.

## 2021-12-17 ENCOUNTER — Telehealth: Payer: Self-pay | Admitting: Family Medicine

## 2021-12-17 NOTE — Telephone Encounter (Signed)
Left message for patient to call back and schedule Medicare Annual Wellness Visit (AWV) virtually  ? ?Last AWV ;12/08/19 ?please schedule at anytime with health coach ? ?

## 2021-12-29 ENCOUNTER — Ambulatory Visit (INDEPENDENT_AMBULATORY_CARE_PROVIDER_SITE_OTHER): Payer: Medicare Other

## 2021-12-29 VITALS — Ht 66.0 in | Wt 231.0 lb

## 2021-12-29 DIAGNOSIS — Z0000 Encounter for general adult medical examination without abnormal findings: Secondary | ICD-10-CM | POA: Diagnosis not present

## 2021-12-29 NOTE — Patient Instructions (Signed)
[name] , ?Thank you for taking time to come for your Medicare Wellness Visit. I appreciate your ongoing commitment to your health goals. Please review the following plan we discussed and let me know if I can assist you in the future.  ? ?Screening recommendations/referrals: ?Colonoscopy: completed 02/01/2018, due 02/02/2028 ?Mammogram: completed 2022, due 02/2022 ?Bone Density: completed 12/23/2015 ?Recommended yearly ophthalmology/optometry visit for glaucoma screening and checkup ?Recommended yearly dental visit for hygiene and checkup ? ?Vaccinations: ?Influenza vaccine: due next flu season ?Pneumococcal vaccine: completed 12/03/2016 ?Tdap vaccine: completed 11/26/2014, due 2/29/2026 ?Shingles vaccine: discussed   ?Covid-19: 05/28/2020, 11/04/2019, 10/10/2019 ? ?Advanced directives: Please bring a copy of your POA (Power of Attorney) and/or Living Will to your next appointment.  ? ?Conditions/risks identified: none ? ?Next appointment: Follow up in one year for your annual wellness visit  ? ? ?Preventive Care 21 Years and Older, Female ?Preventive care refers to lifestyle choices and visits with your health care provider that can promote health and wellness. ?What does preventive care include? ?A yearly physical exam. This is also called an annual well check. ?Dental exams once or twice a year. ?Routine eye exams. Ask your health care provider how often you should have your eyes checked. ?Personal lifestyle choices, including: ?Daily care of your teeth and gums. ?Regular physical activity. ?Eating a healthy diet. ?Avoiding tobacco and drug use. ?Limiting alcohol use. ?Practicing safe sex. ?Taking low-dose aspirin every day. ?Taking vitamin and mineral supplements as recommended by your health care provider. ?What happens during an annual well check? ?The services and screenings done by your health care provider during your annual well check will depend on your age, overall health, lifestyle risk factors, and family  history of disease. ?Counseling  ?Your health care provider may ask you questions about your: ?Alcohol use. ?Tobacco use. ?Drug use. ?Emotional well-being. ?Home and relationship well-being. ?Sexual activity. ?Eating habits. ?History of falls. ?Memory and ability to understand (cognition). ?Work and work Astronomer. ?Reproductive health. ?Screening  ?You may have the following tests or measurements: ?Height, weight, and BMI. ?Blood pressure. ?Lipid and cholesterol levels. These may be checked every 5 years, or more frequently if you are over 44 years old. ?Skin check. ?Lung cancer screening. You may have this screening every year starting at age 90 if you have a 30-pack-year history of smoking and currently smoke or have quit within the past 15 years. ?Fecal occult blood test (FOBT) of the stool. You may have this test every year starting at age 39. ?Flexible sigmoidoscopy or colonoscopy. You may have a sigmoidoscopy every 5 years or a colonoscopy every 10 years starting at age 24. ?Hepatitis C blood test. ?Hepatitis B blood test. ?Sexually transmitted disease (STD) testing. ?Diabetes screening. This is done by checking your blood sugar (glucose) after you have not eaten for a while (fasting). You may have this done every 1-3 years. ?Bone density scan. This is done to screen for osteoporosis. You may have this done starting at age 41. ?Mammogram. This may be done every 1-2 years. Talk to your health care provider about how often you should have regular mammograms. ?Talk with your health care provider about your test results, treatment options, and if necessary, the need for more tests. ?Vaccines  ?Your health care provider may recommend certain vaccines, such as: ?Influenza vaccine. This is recommended every year. ?Tetanus, diphtheria, and acellular pertussis (Tdap, Td) vaccine. You may need a Td booster every 10 years. ?Zoster vaccine. You may need this after age 36. ?Pneumococcal  13-valent conjugate (PCV13)  vaccine. One dose is recommended after age 58. ?Pneumococcal polysaccharide (PPSV23) vaccine. One dose is recommended after age 4. ?Talk to your health care provider about which screenings and vaccines you need and how often you need them. ?This information is not intended to replace advice given to you by your health care provider. Make sure you discuss any questions you have with your health care provider. ?Document Released: 10/11/2015 Document Revised: 06/03/2016 Document Reviewed: 07/16/2015 ?Elsevier Interactive Patient Education ? 2017 [place]. ? ?Fall Prevention in the Home ?Falls can cause injuries. They can happen to people of all ages. There are many things you can do to make your home safe and to help prevent falls. ?What can I do on the outside of my home? ?Regularly fix the edges of walkways and driveways and fix any cracks. ?Remove anything that might make you trip as you walk through a door, such as a raised step or threshold. ?Trim any bushes or trees on the path to your home. ?Use bright outdoor lighting. ?Clear any walking paths of anything that might make someone trip, such as rocks or tools. ?Regularly check to see if handrails are loose or broken. Make sure that both sides of any steps have handrails. ?Any raised decks and porches should have guardrails on the edges. ?Have any leaves, snow, or ice cleared regularly. ?Use sand or salt on walking paths during winter. ?Clean up any spills in your garage right away. This includes oil or grease spills. ?What can I do in the bathroom? ?Use night lights. ?Install grab bars by the toilet and in the tub and shower. Do not use towel bars as grab bars. ?Use non-skid mats or decals in the tub or shower. ?If you need to sit down in the shower, use a plastic, non-slip stool. ?Keep the floor dry. Clean up any water that spills on the floor as soon as it happens. ?Remove soap buildup in the tub or shower regularly. ?Attach bath mats securely with  double-sided non-slip rug tape. ?Do not have throw rugs and other things on the floor that can make you trip. ?What can I do in the bedroom? ?Use night lights. ?Make sure that you have a light by your bed that is easy to reach. ?Do not use any sheets or blankets that are too big for your bed. They should not hang down onto the floor. ?Have a firm chair that has side arms. You can use this for support while you get dressed. ?Do not have throw rugs and other things on the floor that can make you trip. ?What can I do in the kitchen? ?Clean up any spills right away. ?Avoid walking on wet floors. ?Keep items that you use a lot in easy-to-reach places. ?If you need to reach something above you, use a Dickerman step stool that has a grab bar. ?Keep electrical cords out of the way. ?Do not use floor polish or wax that makes floors slippery. If you must use wax, use non-skid floor wax. ?Do not have throw rugs and other things on the floor that can make you trip. ?What can I do with my stairs? ?Do not leave any items on the stairs. ?Make sure that there are handrails on both sides of the stairs and use them. Fix handrails that are broken or loose. Make sure that handrails are as long as the stairways. ?Check any carpeting to make sure that it is firmly attached to the stairs. Fix any carpet  that is loose or worn. ?Avoid having throw rugs at the top or bottom of the stairs. If you do have throw rugs, attach them to the floor with carpet tape. ?Make sure that you have a light switch at the top of the stairs and the bottom of the stairs. If you do not have them, ask someone to add them for you. ?What else can I do to help prevent falls? ?Wear shoes that: ?Do not have high heels. ?Have rubber bottoms. ?Are comfortable and fit you well. ?Are closed at the toe. Do not wear sandals. ?If you use a stepladder: ?Make sure that it is fully opened. Do not climb a closed stepladder. ?Make sure that both sides of the stepladder are locked  into place. ?Ask someone to hold it for you, if possible. ?Clearly mark and make sure that you can see: ?Any grab bars or handrails. ?First and last steps. ?Where the edge of each step is. ?Use tools that help you move ar

## 2021-12-29 NOTE — Addendum Note (Signed)
Addended by: Barb Merino on: 12/29/2021 09:12 AM ? ? Modules accepted: Orders ? ?

## 2021-12-29 NOTE — Progress Notes (Signed)
?I connected with Monico Hoar today by telephone and verified that I am speaking with the correct person using two identifiers. ?Location patient: home ?Location provider: work ?Persons participating in the virtual visit: Greggory Stallion LPN. ?  ?I discussed the limitations, risks, security and privacy concerns of performing an evaluation and management service by telephone and the availability of in person appointments. I also discussed with the patient that there may be a patient responsible charge related to this service. The patient expressed understanding and verbally consented to this telephonic visit.  ?  ?Interactive audio and video telecommunications were attempted between this provider and patient, however failed, due to patient having technical difficulties OR patient did not have access to video capability.  We continued and completed visit with audio only. ? ?  ? ?Vital signs may be patient reported or missing. ? ?Subjective:  ? Terri [name] is a 71 y.o. female who presents for Medicare Annual (Subsequent) preventive examination. ? ?Review of Systems    ? ?Cardiac Risk Factors include: advanced age (>54mn, >>47women);diabetes mellitus;dyslipidemia;hypertension;obesity (BMI >30kg/m2) ? ?   ?Objective:  ?  ?Today's Vitals  ? 12/29/21 0848  ?Weight: 231 lb (104.8 kg)  ?Height: _0  (1.676 m)  ? ?Body mass index is 37.28 kg/m?. ? ? ?  12/29/2021  ?  8:53 AM 11/30/2016  ?  9:26 AM  ?Advanced Directives  ?Does Patient Have a Medical Advance Directive? Yes Yes  ?Type of [place] will [place] will  ?Copy of [place]? No - copy requested No - copy requested  ? ? ?Current Medications (verified) ?Outpatient Encounter Medications as of 12/29/2021  ?Medication Sig  ? amLODipine (NORVASC) 10 MG tablet Take 1 tablet (10 mg total) by mouth daily.  ? aspirin 81 MG tablet Take 81 mg by mouth daily.  ?  atorvastatin (LIPITOR) 40 MG tablet Take 1 tablet (40 mg total) by mouth daily.  ? Blood Glucose Monitoring Suppl (ONETOUCH VERIO IQ SYSTEM) w/Device KIT   ? Calcium Carbonate (CALCIUM 600 PO) Take 1 tablet by mouth daily.  ? Cholecalciferol (VITAMIN D3 PO) Take 2,000 Units by mouth daily.  ? Dulaglutide (TRULICITY) 00.53MZJ/6.7HASOPN Inject 0.75 mg into the skin once a week.  ? glucose blood (ONETOUCH VERIO) test strip USE TO CHECK BLOOD SUGAR DAILY  DX: E11.65  ? Lancets (ONETOUCH ULTRASOFT) lancets USE TO CHECK FASTING BLOOD SUGAR DAILY  DX: E11.65  ? lisinopril (ZESTRIL) 20 MG tablet Take 1 tablet (20 mg total) by mouth daily.  ? Lysine 500 MG TABS Take 1 tablet by mouth daily as needed.  ? metFORMIN (GLUCOPHAGE-XR) 500 MG 24 hr tablet Take 2 tablets (1,000 mg total) by mouth daily with breakfast.  ? nortriptyline (PAMELOR) 10 MG capsule Take 1 capsule (10 mg total) by mouth at bedtime.  ? nystatin cream (MYCOSTATIN) Apply 1 application topically 2 (two) times daily as needed for dry skin.  ? ?No facility-administered encounter medications on file as of 12/29/2021.  ? ? ?Allergies (verified) ?Metoclopramide and Morphine  ? ?History: ?Past Medical History:  ?Diagnosis Date  ? Abdominal mass   ? Abnormal coagulation profile   ? Arthritis   ? CAD (coronary artery disease)   ? Diabetes mellitus type II, uncontrolled   ? GERD (gastroesophageal reflux disease)   ? Hyperlipidemia   ? Hypertension   ? Mesenteric mass 07/2008  ? complicated by nausea & vomiting  ?  Other postoperative infection   ? S/P PICC central line placement 10/2008  ? line sepsis-right PICC line removed   ? ?Past Surgical History:  ?Procedure Laterality Date  ? ABDOMINAL MASS RESECTION  07/2008  ? Mesentertic Mass resection  ? BREAST SURGERY Right 01/27/2016  ? [facility]  ? TOTAL ABDOMINAL HYSTERECTOMY  1993  ? ?Family History  ?Problem Relation Age of Onset  ? Coronary artery disease Mother   ? Hypertension Mother   ? Arthritis Mother   ? Brain cancer  Father 2  ?     deceased  ? Arthritis Father   ? Diabetes Sister   ? Diabetes Brother   ?      X 2  ? Coronary artery disease Brother   ? Prostate cancer Brother   ? Diabetes Brother   ? Colon cancer Neg Hx   ? Stomach cancer Neg Hx   ? ?Social History  ? ?Socioeconomic History  ? Marital status: Married  ?  Spouse name: Not on file  ? Number of children: Not on file  ? Years of education: Not on file  ? Highest education level: Not on file  ?Occupational History  ? Not on file  ?Tobacco Use  ? Smoking status: Never  ? Smokeless tobacco: Never  ?Vaping Use  ? Vaping Use: Never used  ?Substance and Sexual Activity  ? Alcohol use: No  ?  Alcohol/week: 0.0 standard drinks  ? Drug use: No  ? Sexual activity: Yes  ?Other Topics Concern  ? Not on file  ?Social History Narrative  ? Occupation:  retired  ?    Married - two grown children  ?    Never Smoked  ?    Alcohol use-no        ?   ? ?Social Determinants of Health  ? ?Financial Resource Strain: Low Risk   ? Difficulty of Paying Living Expenses: Not hard at all  ?Food Insecurity: No Food Insecurity  ? Worried About Charity fundraiser in the Last Year: Never true  ? Ran Out of Food in the Last Year: Never true  ?Transportation Needs: No Transportation Needs  ? Lack of Transportation (Medical): No  ? Lack of Transportation (Non-Medical): No  ?Physical Activity: Sufficiently Active  ? Days of Exercise per Week: 3 days  ? Minutes of Exercise per Session: 60 min  ?Stress: No Stress Concern Present  ? Feeling of Stress : Not at all  ?Social Connections: Not on file  ? ? ?Tobacco Counseling ?Counseling given: Not Answered ? ? ?Clinical Intake: ? ?Pre-visit preparation completed: Yes ? ?Pain : No/denies pain ? ?  ? ?Nutritional Status: BMI > 30  Obese ?Nutritional Risks: None ?Diabetes: Yes ? ?How often do you need to have someone help you when you read instructions, pamphlets, or other written materials from your doctor or pharmacy?: 1 - Never ?What is the last grade level  you completed in school?: 12th grade ? ?Diabetic?yes ?Nutrition Risk Assessment: ? ?Has the patient had any N/V/D within the last 2 months?  No  ?Does the patient have any non-healing wounds?  No  ?Has the patient had any unintentional weight loss or weight gain?  No  ? ?Diabetes: ? ?Is the patient diabetic?  Yes  ?If diabetic, was a CBG obtained today?  No  ?Did the patient bring in their glucometer from home?  No  ?How often do you monitor your CBG's? 1-2 daily.  ? ?Financial Strains and Diabetes Management: ? ?  Are you having any financial strains with the device, your supplies or your medication? Yes .  ?Does the patient want to be seen by Chronic Care Management for management of their diabetes?  Yes  ?Would the patient like to be referred to a Nutritionist or for Diabetic Management?  No  ? ?Diabetic Exams: ? ?Diabetic Eye Exam: Completed 10/06/2021 ?Diabetic Foot Exam: Completed 10/27/2021 ? ? ?Interpreter Needed?: No ? ?Information entered by :: NAllen LPN ? ? ?Activities of Daily Living ? ?  12/29/2021  ?  8:54 AM 02/28/2021  ? 10:36 AM  ?In your present state of health, do you have any difficulty performing the following activities:  ?Hearing? 0 0  ?Vision? 0 0  ?Difficulty concentrating or making decisions? 0 0  ?Walking or climbing stairs? 0 0  ?Dressing or bathing? 0 0  ?Doing errands, shopping? 0 0  ?Preparing Food and eating ? N   ?Using the Toilet? N   ?In the past six months, have you accidently leaked urine? N   ?Do you have problems with loss of bowel control? N   ?Managing your Medications? N   ?Managing your Finances? N   ?Housekeeping or managing your Housekeeping? N   ? ? ?Patient Care Team: ?Jinny Sanders, MD as PCP - General (Family Medicine) ?Bonner Puna, MD as Referring Physician (Radiation Oncology) ?Verlan Friends, MD as Referring Physician (Hematology and Oncology) ?Cherlyn Roberts, OD as Consulting Physician (Optometry) ? ?Indicate any recent Medical Services you may have received from  other than [facility] providers in the past year (date may be approximate). ? ?   ?Assessment:  ? This is a routine wellness examination for [place]. ? ?Hearing/Vision screen ?Vision Screening - Comments:: Regular

## 2022-01-01 ENCOUNTER — Telehealth: Payer: Self-pay | Admitting: Family Medicine

## 2022-01-01 NOTE — Chronic Care Management (AMB) (Signed)
?  Chronic Care Management  ? ?Note ? ?01/01/2022 ?Name: JANASIA COVERDALE MRN: [id number] DOB: November 02, 1950 ? ?ARLINE KETTER is a 71 y.o. year old female who is a primary care patient of Bedsole, Amy E, MD. I reached out to [name] by phone today in response to a referral sent by Ms. Heywood Bene Doepke's PCP, Excell Seltzer, MD.  ? ?Ms. Nottingham was given information about Chronic Care Management services today including:  ?CCM service includes personalized support from designated clinical staff supervised by her physician, including individualized plan of care and coordination with other care providers ?24/7 contact phone numbers for assistance for urgent and routine care needs. ?Service will only be billed when office clinical staff spend 20 minutes or more in a month to coordinate care. ?Only one practitioner may furnish and bill the service in a calendar month. ?The patient may stop CCM services at any time (effective at the end of the month) by phone call to the office staff. ? ? ?Patient agreed to services and verbal consent obtained.  ? ?Follow up plan:REFERRAL/ NO COPAY ? ? ?Tatjana Dellinger ?Upstream Scheduler  ?

## 2022-01-23 ENCOUNTER — Telehealth: Payer: Self-pay

## 2022-01-23 NOTE — Progress Notes (Signed)
? ? ?  Chronic Care Management ?Pharmacy Assistant  ? ?Name: CERRA EISENHOWER  MRN: [id number] DOB: Aug 21, 1951 ? ?Reason for Encounter: CCM (Initial Questions) ?  ?Recent office visits:  ?12/29/21 AWV  ?10/30/21 Eliezer Lofts, MD Diabetes: A1c - 7.2 Stop (completed): Glipizide and Letrozole.  ? ?Recent consult visits:  ?10/06/21 Louie Bun, MD (Ophthalmology): Diabetic eye exam ? ?Hospital visits:  ?None in previous 6 months ? ?Medications: ?Outpatient Encounter Medications as of 01/23/2022  ?Medication Sig  ? amLODipine (NORVASC) 10 MG tablet Take 1 tablet (10 mg total) by mouth daily.  ? aspirin 81 MG tablet Take 81 mg by mouth daily.  ? atorvastatin (LIPITOR) 40 MG tablet Take 1 tablet (40 mg total) by mouth daily.  ? Blood Glucose Monitoring Suppl (ONETOUCH VERIO IQ SYSTEM) w/Device KIT   ? Calcium Carbonate (CALCIUM 600 PO) Take 1 tablet by mouth daily.  ? Cholecalciferol (VITAMIN D3 PO) Take 2,000 Units by mouth daily.  ? Dulaglutide (TRULICITY) 9.91 AC/4.5EA SOPN Inject 0.75 mg into the skin once a week.  ? glucose blood (ONETOUCH VERIO) test strip USE TO CHECK BLOOD SUGAR DAILY  DX: E11.65  ? Lancets (ONETOUCH ULTRASOFT) lancets USE TO CHECK FASTING BLOOD SUGAR DAILY  DX: E11.65  ? lisinopril (ZESTRIL) 20 MG tablet Take 1 tablet (20 mg total) by mouth daily.  ? Lysine 500 MG TABS Take 1 tablet by mouth daily as needed.  ? metFORMIN (GLUCOPHAGE-XR) 500 MG 24 hr tablet Take 2 tablets (1,000 mg total) by mouth daily with breakfast.  ? nortriptyline (PAMELOR) 10 MG capsule Take 1 capsule (10 mg total) by mouth at bedtime.  ? nystatin cream (MYCOSTATIN) Apply 1 application topically 2 (two) times daily as needed for dry skin.  ? ?No facility-administered encounter medications on file as of 01/23/2022.  ? ?Lab Results  ?Component Value Date/Time  ? HGBA1C 7.2 (A) 10/30/2021 10:13 AM  ? HGBA1C 6.9 (H) 07/11/2021 09:13 AM  ? HGBA1C 7.1 (A) 04/17/2021 02:55 PM  ? HGBA1C 8.1 (H) 02/13/2021 09:46 AM  ? MICROALBUR 1.3  08/14/2014 10:08 AM  ? MICROALBUR 0.2 11/22/2013 09:10 AM  ?  ?BP Readings from Last 3 Encounters:  ?10/30/21 120/70  ?07/24/21 134/82  ?04/17/21 130/80  ? ?Unsuccessful attempt to reach patient. Left patient message reminding patient of appointment. ? ?Patient contacted to confirm telephone appointment with Charlene Brooke, Pharm D, on 01/28/2022 at 9:30. ? ?Star Rating Drugs:  ?Medication:  Last Fill: Day Supply ?Metformin 500 mg 10/30/2021 90 ?Trulicity 8.35 mg 07/57/3225 28 ?Atorvastatin 40 mg 10/30/2021 90  ?Lisinopril 20 mg 10/30/2021 90 ?  ?Care Gaps: ?Annual wellness visit in last year? Yes 12/29/2021 ?Most Recent BP reading: 120/70 on 10/30/2021 ? ?If Diabetic: ?Most recent A1C reading: 7.2 on 10/30/2021 ?Last eye exam / retinopathy screening: Up to date ?Last diabetic foot exam: Up to date ? ?Charlene Brooke, CPP notified ? ?Marijean Niemann, RMA ?Clinical Pharmacy Assistant ?[phone] ? ? ? ? ? ?

## 2022-01-28 ENCOUNTER — Ambulatory Visit (INDEPENDENT_AMBULATORY_CARE_PROVIDER_SITE_OTHER): Payer: Medicare Other | Admitting: Pharmacist

## 2022-01-28 DIAGNOSIS — I152 Hypertension secondary to endocrine disorders: Secondary | ICD-10-CM

## 2022-01-28 DIAGNOSIS — E1169 Type 2 diabetes mellitus with other specified complication: Secondary | ICD-10-CM

## 2022-01-28 DIAGNOSIS — E1159 Type 2 diabetes mellitus with other circulatory complications: Secondary | ICD-10-CM

## 2022-01-28 NOTE — Progress Notes (Signed)
? ?Chronic Care Management ?Pharmacy Note ? ?01/28/2022 ?Name:  Terri [name] MRN:  [id number] DOB:  [birth year] ? ?Summary: CCM Initial visit ?-Pt reports Trulicity is cost-prohibitive - $75 per month now, increases to hundreds in donut hole. Unfortunately her reported income is too high to qualify for any assistance programs (Trulicity or [employer]) ?-Reviewed previous plan to increase Trulicity to 1.5 mg; also discussed relative benefits of Ozempic (higher potency A1c reduction, wt loss, and CV benefits); pt may be interested in switching to Ozempic but would like to discuss with PCP ? ?Recommendations/Changes made from today's visit: ?-No med changes; pt to discuss Trulicity increase vs Ozempic at upcoming PCP appt ? ?Plan: ?-PCP f/u 02/05/22 ?-The patient has been provided with contact information for the care management team and has been advised to call with any health related questions or concerns.  ? ? ? ?Subjective: ?Terri [name] is an 71 y.o. year old female who is a primary patient of Bedsole, Amy E, MD.  The CCM team was consulted for assistance with disease management and care coordination needs.   ? ?Engaged with patient by telephone for initial visit in response to provider referral for pharmacy case management and/or care coordination services.  ? ?Consent to Services:  ?The patient was given the following information about Chronic Care Management services today, agreed to services, and gave verbal consent: 1. CCM service includes personalized support from designated clinical staff supervised by the primary care provider, including individualized plan of care and coordination with other care providers 2. 24/7 contact phone numbers for assistance for urgent and routine care needs. 3. Service will only be billed when office clinical staff spend 20 minutes or more in a month to coordinate care. 4. Only one practitioner may furnish and bill the service in a calendar month. 5.The patient may stop CCM  services at any time (effective at the end of the month) by phone call to the office staff. 6. The patient will be responsible for cost sharing (co-pay) of up to 20% of the service fee (after annual deductible is met). Patient agreed to services and consent obtained. ? ?Patient Care Team: ?Jinny Sanders, MD as PCP - General (Family Medicine) ?Bonner Puna, MD as Referring Physician (Radiation Oncology) ?Verlan Friends, MD as Referring Physician (Hematology and Oncology) ?Cherlyn Roberts, OD as Consulting Physician (Optometry) ?Marcques Wrightsman, [facility] as Pharmacist (Pharmacist) ? ?Recent office visits: ?12/29/21 AWV - referred to Pharmacist for Trulicity assistance. ?10/30/21 Eliezer Lofts, MD Diabetes: A1c - 7.2 Stop (completed): Glipizide and Letrozole. Rec increase Trulicity to 1.5 mg, wants to wait until she is out of current dose. ? ?Recent consult visits: ?10/06/21 Louie Bun, MD (Ophthalmology): Diabetic eye exam ? ?Hospital visits: ?None in previous 6 months ? ? ?Objective: ? ?Lab Results  ?Component Value Date  ? CREATININE 0.72 07/11/2021  ? BUN 7 07/11/2021  ? GFR 83.84 07/11/2021  ? GFRNONAA 109.61 01/21/2010  ? GFRAA 162 10/16/2008  ? NA 142 07/11/2021  ? K 4.4 07/11/2021  ? CALCIUM 10.0 07/11/2021  ? CO2 32 07/11/2021  ? GLUCOSE 117 (H) 07/11/2021  ? ? ?Lab Results  ?Component Value Date/Time  ? HGBA1C 7.2 (A) 10/30/2021 10:13 AM  ? HGBA1C 6.9 (H) 07/11/2021 09:13 AM  ? HGBA1C 7.1 (A) 04/17/2021 02:55 PM  ? HGBA1C 8.1 (H) 02/13/2021 09:46 AM  ? GFR 83.84 07/11/2021 09:13 AM  ? GFR 80.06 02/13/2021 09:46 AM  ? MICROALBUR 1.3 08/14/2014 10:08 AM  ? MICROALBUR  0.2 11/22/2013 09:10 AM  ?  ?Last diabetic Eye exam:  ?Lab Results  ?Component Value Date/Time  ? HMDIABEYEEXA No Retinopathy 10/06/2021 12:00 AM  ?  ?Last diabetic Foot exam:  ?Lab Results  ?Component Value Date/Time  ? HMDIABFOOTEX done 10/27/2021 12:00 AM  ?  ? ?Lab Results  ?Component Value Date  ? CHOL 128 07/11/2021  ? HDL 50.50  07/11/2021  ? [place] 62 07/11/2021  ? LDLDIRECT 63.3 11/22/2013  ? TRIG 79.0 07/11/2021  ? CHOLHDL 3 07/11/2021  ? ? ? ?  Latest Ref Rng & Units 07/11/2021  ?  9:13 AM 02/13/2021  ?  9:46 AM 07/15/2020  ?  9:07 AM  ?Hepatic Function  ?Total Protein 6.0 - 8.3 g/dL 6.7   6.8   6.7    ?Albumin 3.5 - 5.2 g/dL 4.3   4.2   4.2    ?AST 0 - 37 U/L 16   14   15     ?ALT 0 - 35 U/L 14   16   15     ?Alk Phosphatase 39 - 117 U/L 118   125   120    ?Total Bilirubin 0.2 - 1.2 mg/dL 0.7   0.7   0.7    ? ? ?Lab Results  ?Component Value Date/Time  ? TSH 1.67 03/12/2020 09:48 AM  ? TSH 1.79 08/14/2014 10:08 AM  ? FREET4 1.09 03/12/2020 09:48 AM  ? ? ? ?  Latest Ref Rng & Units 03/12/2020  ?  9:48 AM 08/14/2014  ? 10:08 AM 11/22/2012  ?  9:39 AM  ?CBC  ?WBC 4.0 - 10.5 K/uL 8.8   5.2   4.5    ?Hemoglobin 12.0 - 15.0 g/dL 13.3   14.1   14.6    ?Hematocrit 36.0 - 46.0 % 39.4   43.4   44.1    ?Platelets 150.0 - 400.0 K/uL 286.0   296.0   249.0    ? ? ?Lab Results  ?Component Value Date/Time  ? VD25OH 28.42 (L) 03/12/2020 09:48 AM  ? ? ?Clinical ASCVD: Yes  ?The ASCVD Risk score (Arnett DK, et al., 2019) failed to calculate for the following reasons: ?  The valid total cholesterol range is 130 to 320 mg/dL   ? ? ?  12/29/2021  ?  8:54 AM 02/28/2021  ? 10:37 AM 12/08/2019  ? 11:02 AM  ?Depression screen PHQ 2/9  ?Decreased Interest 0 0 0  ?Down, Depressed, Hopeless 0 0 0  ?PHQ - 2 Score 0 0 0  ?Altered sleeping  0   ?Tired, decreased energy  3   ?Change in appetite  3   ?Feeling bad or failure about yourself   0   ?Trouble concentrating  0   ?Moving slowly or fidgety/restless  0   ?Suicidal thoughts  0   ?PHQ-9 Score  6   ?Difficult doing work/chores  Not difficult at all   ?  ?Social History  ? ?Tobacco Use  ?Smoking Status Never  ?Smokeless Tobacco Never  ? ?BP Readings from Last 3 Encounters:  ?10/30/21 120/70  ?07/24/21 134/82  ?04/17/21 130/80  ? ?Pulse Readings from Last 3 Encounters:  ?10/30/21 73  ?07/24/21 73  ?04/17/21 72  ? ?Wt Readings  from Last 3 Encounters:  ?12/29/21 231 lb (104.8 kg)  ?10/30/21 228 lb 4 oz (103.5 kg)  ?07/24/21 234 lb (106.1 kg)  ? ?BMI Readings from Last 3 Encounters:  ?12/29/21 37.28 kg/m?  ?10/30/21 36.29 kg/m?  ?07/24/21 37.20 kg/m?  ? ? ?  Assessment/Interventions: Review of patient past medical history, allergies, medications, health status, including review of consultants reports, laboratory and other test data, was performed as part of comprehensive evaluation and provision of chronic care management services.  ? ?SDOH:  (Social Determinants of Health) assessments and interventions performed: No - done 12/2021 AWV ? ?SDOH Screenings  ? ?Alcohol Screen: Not on file  ?Depression (PHQ2-9): Low Risk   ? PHQ-2 Score: 0  ?Financial Resource Strain: Low Risk   ? Difficulty of Paying Living Expenses: Not hard at all  ?Food Insecurity: No Food Insecurity  ? Worried About Charity fundraiser in the Last Year: Never true  ? Ran Out of Food in the Last Year: Never true  ?Housing: Not on file  ?Physical Activity: Sufficiently Active  ? Days of Exercise per Week: 3 days  ? Minutes of Exercise per Session: 60 min  ?Social Connections: Not on file  ?Stress: No Stress Concern Present  ? Feeling of Stress : Not at all  ?Tobacco Use: Low Risk   ? Smoking Tobacco Use: Never  ? Smokeless Tobacco Use: Never  ? Passive Exposure: Not on file  ?Transportation Needs: No Transportation Needs  ? Lack of Transportation (Medical): No  ? Lack of Transportation (Non-Medical): No  ? ? ?CCM Care Plan ? ?Allergies  ?Allergen Reactions  ? Metoclopramide   ?  Other reaction(s): Unknown  ? Morphine   ?  Other reaction(s): Unknown  ? ? ?Medications Reviewed Today   ? ? Reviewed by Charlton Haws, RPH (Pharmacist) on 01/28/22 at 1001  Med List Status: <None>  ? ?Medication Order Taking? Sig Documenting Provider Last Dose Status Informant  ?amLODipine (NORVASC) 10 MG tablet [id number] Yes Take 1 tablet (10 mg total) by mouth daily. Jinny Sanders, MD Taking  Active   ?aspirin 81 MG tablet [id number] Yes Take 81 mg by mouth daily. [provider] Taking Active   ?atorvastatin (LIPITOR) 40 MG tablet [id number] Yes Take 1 tablet (40 mg total) by mouth daily. Bed

## 2022-01-28 NOTE — Patient Instructions (Signed)
Visit Information ? ?Phone number for Pharmacist: [phone] ? ?Thank you for meeting with me to discuss your medications! I look forward to working with you to achieve your health care goals. Below is a summary of what we talked about during the visit: ? ? Goals Addressed   ?None ?  ? ? ?Care Plan : CCM Pharmacy Care Plan  ?Updates made by Terri [name], Terri [name] since 01/28/2022 12:00 AM  ?  ? ?Problem: Hypertension, Hyperlipidemia, Diabetes, and Coronary Artery Disease, Cluster headache   ?Priority: High  ?  ? ?Long-Range Goal: Disease mgmt   ?Start Date: 01/28/2022  ?Expected End Date: 01/29/2023  ?This Visit's Progress: On track  ?Priority: High  ?Note:   ?Current Barriers:  ?Trulicity cost ? ?Pharmacist Clinical Goal(s):  ?Patient will contact provider office for questions/concerns as evidenced notation of same in electronic health record through collaboration with PharmD and provider.  ? ?Interventions: ?1:1 collaboration with Excell Seltzer, MD regarding development and update of comprehensive plan of care as evidenced by provider attestation and co-signature ?Inter-disciplinary care team collaboration (see longitudinal plan of care) ?Comprehensive medication review performed; medication list updated in electronic medical record ? ?Hypertension (BP goal <130/80) ?-Controlled - BP at goal in office; pt affirms compliance ?-Current treatment: ?Amlodipine 10 mg daily - Appropriate, Effective, Safe, Accessible ?Lisinopril 20 mg daily -Appropriate, Effective, Safe, Accessible ?-Medications previously tried: n/a  ?-Educated on BP goals and benefits of medications for prevention of heart attack, stroke and kidney damage; ?-Counseled to monitor BP at home periodically ?-Recommended to continue current medication ? ?Hyperlipidemia: (LDL goal < 70) ?-Controlled - LDL 62 (06/2021) at goal; pt affirms compliance ?-Hx CAD (CABG - 2009?) ?-Current treatment: ?Atorvastatin 40 mg daily -Appropriate, Effective, Safe,  Accessible ?Aspirin 81 mg daily -Appropriate, Effective, Safe, Accessible ?-Medications previously tried: n/a  ?-Educated on Cholesterol goals;  ?-Recommended to continue current medication ? ?Diabetes (A1c goal <7%) ?-Not ideally controlled - A1c 7.2% (10/2021), PCP has recommended increasing Trulicity to 1.5 mg however pt wanted to finish supply before ordering new Rx. ?-Pt reports Trulicity is cost prohibitive - currently $74 per month, increases to several hundred in donut hole. Discussed pt assistance opportunities - unfortunately pt reports her income is above limit for both [employer] and [employer] ?-Current home glucose readings ?fasting glucose: 118 ?post prandial glucose: 160 ?-Denies hypoglycemic/hyperglycemic symptoms ?-Current medications: ?Metformin ER 500 mg - 2 tab AM - Appropriate, Query Effective ?Trulicity 0.75 mg weekly - Appropriate, Query Effective ?-Medications previously tried: glipizide, metformin 2g/day (diarrhea) ?-Educated on A1c and blood sugar goals; ?-Reviewed benefits of Trulicity/Ozempic; discussed previous plan to increase Trulicity, pt may be interested in switching to Ozempic instead - she would like to discuss at upcoming PCP visit ?-Recommended to continue current medication ? ?Cluster Headache (Goal: reduce frequency) ?-Controlled - per pt report ?-Current treatment  ?Nortriptyline 10 mg ?-Medications previously tried: n/a  ?-Recommended to continue current medication ? ?Health Maintenance ?-Vaccine gaps: Shingrix ?-Hx breast cancer (2017). Completed 5 years on Letrozole. ?-Current therapy:  ?Calcium carbonate 600 mg daily ?Vitamin D 2000 IU daily ?Vitamin B12 100 mcg ?Nystatin cream  ?-Patient is satisfied with current therapy and denies issues ?-Recommended to continue current medication ? ?Patient Goals/Self-Care Activities ?Patient will:  ?- take medications as prescribed as evidenced by patient report and record review ?focus on medication adherence by  routine ?check glucose daily, document, and provide at future appointments ?  ?  ? ?Terri [name] was given information about  Chronic Care Management services today including:  ?CCM service includes personalized support from designated clinical staff supervised by her physician, including individualized plan of care and coordination with other care providers ?24/7 contact phone numbers for assistance for urgent and routine care needs. ?Standard insurance, coinsurance, copays and deductibles apply for chronic care management only during months in which we provide at least 20 minutes of these services. Most insurances cover these services at 100%, however patients may be responsible for any copay, coinsurance and/or deductible if applicable. This service may help you avoid the need for more expensive face-to-face services. ?Only one practitioner may furnish and bill the service in a calendar month. ?The patient may stop CCM services at any time (effective at the end of the month) by phone call to the office staff. ? ?Patient agreed to services and verbal consent obtained.  ? ?Patient verbalizes understanding of instructions and care plan provided today and agrees to view in MyChart. Active MyChart status confirmed with patient.   ?The patient has been provided with contact information for the care management team and has been advised to call with any health related questions or concerns.  ? ?Terri [name], PharmD, BCACP ?Clinical Pharmacist ?[facility] ?[phone]  ?

## 2022-02-05 ENCOUNTER — Ambulatory Visit: Payer: Medicare Other | Admitting: Family Medicine

## 2022-02-10 ENCOUNTER — Ambulatory Visit (INDEPENDENT_AMBULATORY_CARE_PROVIDER_SITE_OTHER): Payer: Medicare Other | Admitting: Family Medicine

## 2022-02-10 ENCOUNTER — Encounter: Payer: Self-pay | Admitting: Family Medicine

## 2022-02-10 VITALS — BP 120/72 | HR 74 | Temp 98.0°F | Ht 66.5 in | Wt 226.2 lb

## 2022-02-10 DIAGNOSIS — I890 Lymphedema, not elsewhere classified: Secondary | ICD-10-CM

## 2022-02-10 DIAGNOSIS — E1159 Type 2 diabetes mellitus with other circulatory complications: Secondary | ICD-10-CM

## 2022-02-10 LAB — POCT GLYCOSYLATED HEMOGLOBIN (HGB A1C): Hemoglobin A1C: 7.3 % — AB (ref 4.0–5.6)

## 2022-02-10 MED ORDER — TRULICITY 1.5 MG/0.5ML ~~LOC~~ SOAJ: 1.5000 mg | SUBCUTANEOUS | 3 refills | Status: DC

## 2022-02-10 NOTE — Patient Instructions (Signed)
Can wear compression sleeve on right arm to help with soreness from lymphedema. ? Change to 1.5 mg weekly of Trulicity. ? Continue working on low carb diet, increase exercise and weight management. ?

## 2022-02-10 NOTE — Progress Notes (Signed)
  Patient ID: Terri [name], female    DOB: 08/12/1951, 71 y.o.   MRN: [id number]  This visit was conducted in person.  BP 120/72   Pulse 74   Temp 98 F (36.7 C) (Oral)   Ht 5' 6.5" (1.689 m)   Wt 226 lb 3 oz (102.6 kg)   SpO2 98%   BMI 35.96 kg/m    CC:  Chief Complaint  Patient presents with   Diabetes    Subjective:   HPI: Terri [name] is a 71 y.o. female presenting on 02/10/2022 for Diabetes   Diabetes  on Trulicity, and metformin  XR 1000 mg daily in the last 3 months.  Lab Results  Component Value Date   HGBA1C 7.3 (A) 02/10/2022   Reviewed pharmacy note from 01/28/2022:Pt reports Trulicity is cost-prohibitive - $75 per month now, increases to hundreds in donut hole. Unfortunately her reported income is too high to qualify for any assistance programs (Trulicity or Ozempic) -Reviewed previous plan to increase Trulicity to 1.5 mg; also discussed relative benefits of Ozempic (higher potency A1c reduction, wt loss, and CV benefits); pt may be interested in switching to Ozempic but would like to discuss with PCP  Using medications without difficulties: Hypoglycemic episodes: none Hyperglycemic episodes: none Feet problems: none Blood Sugars averaging: FBS 128-163 eye exam within last year: yes    Wt Readings from Last 3 Encounters:  02/10/22 226 lb 3 oz (102.6 kg)  12/29/21 231 lb (104.8 kg)  10/30/21 228 lb 4 oz (103.5 kg)     She reports soreness  in right arm. Has not been wearing compression sleeve .. needs a new one. S/P breast cancer surgery and lymph node removal.      Relevant past medical, surgical, family and social history reviewed and updated as indicated. Interim medical history since our last visit reviewed. Allergies and medications reviewed and updated. Outpatient Medications Prior to Visit  Medication Sig Dispense Refill   amLODipine (NORVASC) 10 MG tablet Take 1 tablet (10 mg total) by mouth daily. 30 tablet 0   aspirin 81 MG tablet Take  81 mg by mouth daily.     atorvastatin (LIPITOR) 40 MG tablet Take 1 tablet (40 mg total) by mouth daily. 90 tablet 3   Blood Glucose Monitoring Suppl (ONETOUCH VERIO IQ SYSTEM) w/Device KIT      Calcium Carbonate (CALCIUM 600 PO) Take 1 tablet by mouth daily.     Cholecalciferol (VITAMIN D3 PO) Take 2,000 Units by mouth daily.     Dulaglutide (TRULICITY) 0.75 MG/0.5ML SOPN Inject 0.75 mg into the skin once a week. 6 mL 1   glucose blood (ONETOUCH VERIO) test strip USE TO CHECK BLOOD SUGAR DAILY  DX: E11.65 100 each 3   Lancets (ONETOUCH ULTRASOFT) lancets USE TO CHECK FASTING BLOOD SUGAR DAILY  DX: E11.65 100 each 3   lisinopril (ZESTRIL) 20 MG tablet Take 1 tablet (20 mg total) by mouth daily. 30 tablet 0   Lysine 500 MG TABS Take 1 tablet by mouth daily as needed.     metFORMIN (GLUCOPHAGE-XR) 500 MG 24 hr tablet Take 2 tablets (1,000 mg total) by mouth daily with breakfast. 180 tablet 1   nortriptyline (PAMELOR) 10 MG capsule Take 1 capsule (10 mg total) by mouth at bedtime. 90 capsule 0   nystatin cream (MYCOSTATIN) Apply 1 application topically 2 (two) times daily as needed for dry skin. 30 g 0   No facility-administered medications prior to visit.       Per HPI unless specifically indicated in ROS section below Review of Systems  Constitutional:  Negative for fatigue and fever.  HENT:  Negative for ear pain.   Eyes:  Negative for pain.  Respiratory:  Negative for chest tightness and shortness of breath.   Cardiovascular:  Negative for chest pain, palpitations and leg swelling.  Gastrointestinal:  Negative for abdominal pain.  Genitourinary:  Negative for dysuria.   Objective:  BP 120/72   Pulse 74   Temp 98 F (36.7 C) (Oral)   Ht 5' 6.5" (1.689 m)   Wt 226 lb 3 oz (102.6 kg)   SpO2 98%   BMI 35.96 kg/m   Wt Readings from Last 3 Encounters:  02/10/22 226 lb 3 oz (102.6 kg)  12/29/21 231 lb (104.8 kg)  10/30/21 228 lb 4 oz (103.5 kg)      Physical Exam Constitutional:       General: She is not in acute distress.    Appearance: Normal appearance. She is well-developed. She is not ill-appearing or toxic-appearing.  HENT:     Head: Normocephalic.     Right Ear: Hearing, tympanic membrane, ear canal and external ear normal. Tympanic membrane is not erythematous, retracted or bulging.     Left Ear: Hearing, tympanic membrane, ear canal and external ear normal. Tympanic membrane is not erythematous, retracted or bulging.     Nose: No mucosal edema or rhinorrhea.     Right Sinus: No maxillary sinus tenderness or frontal sinus tenderness.     Left Sinus: No maxillary sinus tenderness or frontal sinus tenderness.     Mouth/Throat:     Pharynx: Uvula midline.  Eyes:     General: Lids are normal. Lids are everted, no foreign bodies appreciated.     Conjunctiva/sclera: Conjunctivae normal.     Pupils: Pupils are equal, round, and reactive to light.  Neck:     Thyroid: No thyroid mass or thyromegaly.     Vascular: No carotid bruit.     Trachea: Trachea normal.  Cardiovascular:     Rate and Rhythm: Normal rate and regular rhythm.     Pulses: Normal pulses.     Heart sounds: Normal heart sounds, S1 normal and S2 normal. No murmur heard.    No friction rub. No gallop.  Pulmonary:     Effort: Pulmonary effort is normal. No tachypnea or respiratory distress.     Breath sounds: Normal breath sounds. No decreased breath sounds, wheezing, rhonchi or rales.  Abdominal:     General: Bowel sounds are normal.     Palpations: Abdomen is soft.     Tenderness: There is no abdominal tenderness.  Musculoskeletal:     Cervical back: Normal range of motion and neck supple.     Comments: Right arm swelling, tenderness to palpation  Skin:    General: Skin is warm and dry.     Findings: No rash.  Neurological:     Mental Status: She is alert.  Psychiatric:        Mood and Affect: Mood is not anxious or depressed.        Speech: Speech normal.        Behavior: Behavior  normal. Behavior is cooperative.        Thought Content: Thought content normal.        Judgment: Judgment normal.       Results for orders placed or performed in visit on 02/10/22  POCT glycosylated hemoglobin (Hb A1C)  Result Value Ref   Range   Hemoglobin A1C 7.3 (A) 4.0 - 5.6 %   HbA1c POC (<> result, manual entry)     HbA1c, POC (prediabetic range)     HbA1c, POC (controlled diabetic range)       COVID 19 screen:  No recent travel or known exposure to COVID19 The patient denies respiratory symptoms of COVID 19 at this time. The importance of social distancing was discussed today.   Assessment and Plan Problem List Items Addressed This Visit     Type 2 diabetes mellitus with other circulatory complications HTN ([place]) - Primary (Chronic)    Chronic, moderate control  Change to 1.5 mg weekly of Trulicity.  Continue working on low carb diet, increase exercise and weight management.      Relevant Medications   Dulaglutide (TRULICITY) 1.5 UJ/8.1XB SOPN   Other Relevant Orders   POCT glycosylated hemoglobin (Hb A1C) (Completed)   Lymphedema    Acute on chronic worsening  Lymphedema in her right arm is from previous breast cancer surgery and lymph node removal.  She needs a prescription for a new compression sleeve.  She has not been wearing any recently and this is  likely the cause of her worsening symptoms     Prescription provided as requested for right arm compression sleeve.    Eliezer Lofts, MD

## 2022-02-25 DIAGNOSIS — I10 Essential (primary) hypertension: Secondary | ICD-10-CM

## 2022-02-25 DIAGNOSIS — E1169 Type 2 diabetes mellitus with other specified complication: Secondary | ICD-10-CM

## 2022-02-25 DIAGNOSIS — E785 Hyperlipidemia, unspecified: Secondary | ICD-10-CM | POA: Diagnosis not present

## 2022-02-25 DIAGNOSIS — Z794 Long term (current) use of insulin: Secondary | ICD-10-CM

## 2022-02-25 DIAGNOSIS — Z7984 Long term (current) use of oral hypoglycemic drugs: Secondary | ICD-10-CM

## 2022-03-23 DIAGNOSIS — Z1231 Encounter for screening mammogram for malignant neoplasm of breast: Secondary | ICD-10-CM | POA: Diagnosis not present

## 2022-04-04 DIAGNOSIS — I890 Lymphedema, not elsewhere classified: Secondary | ICD-10-CM | POA: Insufficient documentation

## 2022-04-04 NOTE — Assessment & Plan Note (Signed)
Chronic, moderate control  Change to 1.5 mg weekly of Trulicity.  Continue working on low carb diet, increase exercise and weight management.

## 2022-04-04 NOTE — Assessment & Plan Note (Signed)
Acute on chronic worsening  Lymphedema in her right arm is from previous breast cancer surgery and lymph node removal.  She needs a prescription for a new compression sleeve.  She has not been wearing any recently and this is  likely the cause of her worsening symptoms

## 2022-05-07 ENCOUNTER — Telehealth: Payer: Self-pay | Admitting: Family Medicine

## 2022-05-07 DIAGNOSIS — E1159 Type 2 diabetes mellitus with other circulatory complications: Secondary | ICD-10-CM

## 2022-05-07 NOTE — Telephone Encounter (Signed)
-----   Message from Alvina Chou sent at 04/28/2022  4:20 PM EDT ----- Regarding: Lab orders for Thursday, 8.17.23 Patient is scheduled for CPX labs, please order future labs, Thanks , Camelia Eng

## 2022-05-15 ENCOUNTER — Other Ambulatory Visit: Payer: Medicare Other

## 2022-05-18 ENCOUNTER — Other Ambulatory Visit: Payer: Self-pay | Admitting: Family Medicine

## 2022-05-18 NOTE — Telephone Encounter (Signed)
Refill request for Amlodipine and Lisinopril Last refill on both 03/28/21 #30 each Last office visit 02/10/22 Upcoming appointment 07/23/22

## 2022-05-22 ENCOUNTER — Encounter: Payer: Medicare Other | Admitting: Family Medicine

## 2022-06-15 ENCOUNTER — Other Ambulatory Visit: Payer: Self-pay | Admitting: Family Medicine

## 2022-06-15 ENCOUNTER — Other Ambulatory Visit: Payer: Medicare Other

## 2022-07-15 ENCOUNTER — Other Ambulatory Visit (INDEPENDENT_AMBULATORY_CARE_PROVIDER_SITE_OTHER): Payer: Medicare Other

## 2022-07-15 DIAGNOSIS — E1159 Type 2 diabetes mellitus with other circulatory complications: Secondary | ICD-10-CM | POA: Diagnosis not present

## 2022-07-15 LAB — COMPREHENSIVE METABOLIC PANEL
ALT: 20 U/L (ref 0–35)
AST: 16 U/L (ref 0–37)
Albumin: 4.3 g/dL (ref 3.5–5.2)
Alkaline Phosphatase: 116 U/L (ref 39–117)
BUN: 9 mg/dL (ref 6–23)
CO2: 33 mEq/L — ABNORMAL HIGH (ref 19–32)
Calcium: 9.6 mg/dL (ref 8.4–10.5)
Chloride: 100 mEq/L (ref 96–112)
Creatinine, Ser: 0.73 mg/dL (ref 0.40–1.20)
GFR: 81.88 mL/min (ref >60.00)
Glucose, Bld: 122 mg/dL — ABNORMAL HIGH (ref 70–99)
Potassium: 4.2 mEq/L (ref 3.5–5.1)
Sodium: 137 mEq/L (ref 135–145)
Total Bilirubin: 0.8 mg/dL (ref 0.2–1.2)
Total Protein: 7 g/dL (ref 6.0–8.3)

## 2022-07-15 LAB — LIPID PANEL
Cholesterol: 134 mg/dL (ref 0–200)
HDL: 51 mg/dL (ref >39.00)
LDL Cholesterol: 68 mg/dL (ref 0–99)
NonHDL: 82.99
Total CHOL/HDL Ratio: 3
Triglycerides: 75 mg/dL (ref 0.0–149.0)
VLDL: 15 mg/dL (ref 0.0–40.0)

## 2022-07-15 LAB — HEMOGLOBIN A1C: Hgb A1c MFr Bld: 7.1 % — ABNORMAL HIGH (ref 4.6–6.5)

## 2022-07-15 NOTE — Progress Notes (Signed)
No critical labs need to be addressed urgently. We will discuss labs in detail at upcoming office visit.   

## 2022-07-16 ENCOUNTER — Other Ambulatory Visit: Payer: Medicare Other

## 2022-07-23 ENCOUNTER — Encounter: Payer: Self-pay | Admitting: Family Medicine

## 2022-07-23 ENCOUNTER — Ambulatory Visit (INDEPENDENT_AMBULATORY_CARE_PROVIDER_SITE_OTHER): Payer: Medicare Other | Admitting: Family Medicine

## 2022-07-23 VITALS — BP 120/60 | HR 92 | Temp 98.3°F | Ht 66.0 in | Wt 225.0 lb

## 2022-07-23 DIAGNOSIS — Z6837 Body mass index (BMI) 37.0-37.9, adult: Secondary | ICD-10-CM

## 2022-07-23 DIAGNOSIS — E1159 Type 2 diabetes mellitus with other circulatory complications: Secondary | ICD-10-CM

## 2022-07-23 DIAGNOSIS — Z23 Encounter for immunization: Secondary | ICD-10-CM

## 2022-07-23 DIAGNOSIS — E66812 Obesity, class 2: Secondary | ICD-10-CM

## 2022-07-23 DIAGNOSIS — Z0000 Encounter for general adult medical examination without abnormal findings: Secondary | ICD-10-CM | POA: Diagnosis not present

## 2022-07-23 DIAGNOSIS — E1169 Type 2 diabetes mellitus with other specified complication: Secondary | ICD-10-CM | POA: Diagnosis not present

## 2022-07-23 DIAGNOSIS — M5441 Lumbago with sciatica, right side: Secondary | ICD-10-CM

## 2022-07-23 DIAGNOSIS — I152 Hypertension secondary to endocrine disorders: Secondary | ICD-10-CM

## 2022-07-23 DIAGNOSIS — E785 Hyperlipidemia, unspecified: Secondary | ICD-10-CM

## 2022-07-23 MED ORDER — DICLOFENAC SODIUM 75 MG PO TBEC: 75.0000 mg | DELAYED_RELEASE_TABLET | Freq: Two times a day (BID) | ORAL | 0 refills | Status: DC

## 2022-07-23 NOTE — Assessment & Plan Note (Signed)
Stable, chronic.  Continue current medication.   LDL at goal less than 70 on atorvastatin 40 mg daily

## 2022-07-23 NOTE — Addendum Note (Signed)
Addended by: Carter Kitten on: 07/23/2022 03:22 PM   Modules accepted: Orders

## 2022-07-23 NOTE — Progress Notes (Signed)
Patient ID: Terri [name], female    DOB: 1950-10-17, 71 y.o.   MRN: [id number]  This visit was conducted in person.  BP 120/60   Pulse 92   Temp 98.3 F (36.8 C) (Oral)   Ht 5' 6"  (1.676 m)   Wt 225 lb (102.1 kg)   SpO2 98%   BMI 36.32 kg/m    CC:  Chief Complaint  Patient presents with   Annual Exam    Part 2    Subjective:   HPI: Terri [name] is a 71 y.o. female presenting on 07/23/2022 for Annual Exam (Part 2)  The patient presents for annual medicare wellness, complete physical and review of chronic health problems. He/She also has the following acute concerns today:   1-2 month of right lower back pain.. right lateral  No numbness, no weakness.  Using tylenol arthritis prn.  Hypertension:  Well-controlled on lisinopril 20 mg daily, amlodipine 10 mg p.o. daily BP Readings from Last 3 Encounters:  07/23/22 120/60  02/10/22 120/72  10/30/21 120/70  Using medication without problems or lightheadedness:  none Chest pain with exertion: none Edema: none Short of breath: none Average home BPs: Other issues:  Elevated Cholesterol: LDL at goal less than 70 on atorvastatin 40 mg daily Lab Results  Component Value Date   CHOL 134 07/15/2022   HDL 51.00 07/15/2022   LDLCALC 68 07/15/2022   LDLDIRECT 63.3 11/22/2013   TRIG 75.0 07/15/2022   CHOLHDL 3 07/15/2022  Using medications without problems: none Muscle aches:  none Diet compliance: heart healthy, trying Exercise: minimal Other complaints:  Diabetes: Tolerable control on Trulicity 1.5 mg weekly and metformin XR 500 mg 2 tablets daily Lab Results  Component Value Date   HGBA1C 7.1 (H) 07/15/2022  Using medications without difficulties: none Hypoglycemic episodes: none Hyperglycemic episodes: none Feet problems: none Blood Sugars averaging:  occ eye exam within last year: year  Obesity Body mass index is 36.32 kg/m. Wt Readings from Last 3 Encounters:  07/23/22 225 lb (102.1 kg)  02/10/22  226 lb 3 oz (102.6 kg)  12/29/21 231 lb (104.8 kg)         Relevant past medical, surgical, family and social history reviewed and updated as indicated. Interim medical history since our last visit reviewed. Allergies and medications reviewed and updated. Outpatient Medications Prior to Visit  Medication Sig Dispense Refill   amLODipine (NORVASC) 10 MG tablet TAKE 1 TABLET BY MOUTH DAILY 90 tablet 3   aspirin 81 MG tablet Take 81 mg by mouth daily.     atorvastatin (LIPITOR) 40 MG tablet TAKE 1 TABLET BY MOUTH DAILY 90 tablet 0   Blood Glucose Monitoring Suppl (ONETOUCH VERIO IQ SYSTEM) w/Device KIT      Calcium Carbonate (CALCIUM 600 PO) Take 1 tablet by mouth daily.     Cholecalciferol (VITAMIN D3 PO) Take 2,000 Units by mouth daily.     Dulaglutide (TRULICITY) 1.5 ZY/6.0YT SOPN Inject 1.5 mg into the skin once a week. 6 mL 3   glucose blood (ONETOUCH VERIO) test strip USE TO CHECK BLOOD SUGAR DAILY  DX: E11.65 100 each 3   Lancets (ONETOUCH ULTRASOFT) lancets USE TO CHECK FASTING BLOOD SUGAR DAILY  DX: E11.65 100 each 3   lisinopril (ZESTRIL) 20 MG tablet TAKE 1 TABLET BY MOUTH DAILY 90 tablet 3   Lysine 500 MG TABS Take 1 tablet by mouth daily as needed.     metFORMIN (GLUCOPHAGE-XR) 500 MG 24 hr tablet TAKE  2 TABLETS BY MOUTH DAILY WITH BREAKFAST 180 tablet 0   nortriptyline (PAMELOR) 10 MG capsule Take 1 capsule (10 mg total) by mouth at bedtime. 90 capsule 0   nystatin cream (MYCOSTATIN) Apply 1 application topically 2 (two) times daily as needed for dry skin. 30 g 0   No facility-administered medications prior to visit.     Per HPI unless specifically indicated in ROS section below Review of Systems Objective:  BP 120/60   Pulse 92   Temp 98.3 F (36.8 C) (Oral)   Ht 5' 6"  (1.676 m)   Wt 225 lb (102.1 kg)   SpO2 98%   BMI 36.32 kg/m   Wt Readings from Last 3 Encounters:  07/23/22 225 lb (102.1 kg)  02/10/22 226 lb 3 oz (102.6 kg)  12/29/21 231 lb (104.8 kg)       Physical Exam    Results for orders placed or performed in visit on 07/15/22  Comprehensive metabolic panel  Result Value Ref Range   Sodium 137 135 - 145 mEq/L   Potassium 4.2 3.5 - 5.1 mEq/L   Chloride 100 96 - 112 mEq/L   CO2 33 (H) 19 - 32 mEq/L   Glucose, Bld 122 (H) 70 - 99 mg/dL   BUN 9 6 - 23 mg/dL   Creatinine, Ser 0.73 0.40 - 1.20 mg/dL   Total Bilirubin 0.8 0.2 - 1.2 mg/dL   Alkaline Phosphatase 116 39 - 117 U/L   AST 16 0 - 37 U/L   ALT 20 0 - 35 U/L   Total Protein 7.0 6.0 - 8.3 g/dL   Albumin 4.3 3.5 - 5.2 g/dL   GFR 81.88 >60.00 mL/min   Calcium 9.6 8.4 - 10.5 mg/dL  Lipid panel  Result Value Ref Range   Cholesterol 134 0 - 200 mg/dL   Triglycerides 75.0 0.0 - 149.0 mg/dL   HDL 51.00 >39.00 mg/dL   VLDL 15.0 0.0 - 40.0 mg/dL   LDL Cholesterol 68 0 - 99 mg/dL   Total CHOL/HDL Ratio 3    NonHDL 82.99   Hemoglobin A1c  Result Value Ref Range   Hgb A1c MFr Bld 7.1 (H) 4.6 - 6.5 %     COVID 19 screen:  No recent travel or known exposure to COVID19 The patient denies respiratory symptoms of COVID 19 at this time. The importance of social distancing was discussed today.   Assessment and Plan The patient's preventative maintenance and recommended screening tests for an annual wellness exam were reviewed in full today. Brought up to date unless services declined.  Counselled on the importance of diet, exercise, and its role in overall health and mortality. The patient's FH and SH was reviewed, including their home life, tobacco status, and drug and alcohol status.    Vaccines: uptodate Has had COVID19 vaccines x 2 consider shingrix series, COVID #3. Given HD flu shot today. Pap/DVE:   S/P TAH.  Mammo:  Stable 03/23/2022, Hx of breast cancer right arm lymphadenopathy. Bone Density: 9/26/ 2019 normal. Repeat in 5 years. Colon:  01/2018 no polyps,repeat in 10- year, Dr. Loletha Carrow Smoking Status: none ETOH/ drug use: none/none  Hep C:  done   Problem List Items  Addressed This Visit     Hyperlipidemia associated with type 2 diabetes mellitus ([place]) (Chronic)    Stable, chronic.  Continue current medication.   LDL at goal less than 70 on atorvastatin 40 mg daily      Hypertension associated with diabetes ([place]) (Chronic)  Stable, chronic.  Continue current medication.  Well-controlled on lisinopril 20 mg daily, amlodipine 10 mg p.o. daily       Type 2 diabetes mellitus with other circulatory complications HTN (HCC) (Chronic)    Stable, chronic.  Continue current medication.   Tolerable control on Trulicity 1.5 mg weekly and metformin XR 500 mg 2 tablets daily      Relevant Orders   Microalbumin / creatinine urine ratio   Acute right-sided low back pain with right-sided sciatica    Most likely right low back and right hip pain from back in origin possible sciatica plus or minus right hip arthritis given decreased range of motion in right hip.  We will start with diclofenac 75 mg p.o. twice daily x1 to 2 weeks.  She will start home physical therapy for low back.  If she is not improving as expected we will have her return in 2 weeks for lumbar and right hip film as well as possible referral to physical therapy      Relevant Medications   diclofenac (VOLTAREN) 75 MG EC tablet   Class 2 severe obesity with serious comorbidity and body mass index (BMI) of 37.0 to 37.9 in adult [facility])    Encouraged exercise, weight loss, healthy eating habits.       Other Visit Diagnoses     Routine general medical examination at a health care facility    -  Primary        Eliezer Lofts, MD

## 2022-07-23 NOTE — Assessment & Plan Note (Signed)
Stable, chronic.  Continue current medication.  Well-controlled on lisinopril 20 mg daily, amlodipine 10 mg p.o. daily

## 2022-07-23 NOTE — Patient Instructions (Addendum)
Call [facility] to set up mammogram in 02/2023.  Start home PT 2 times daily.  Start diclofenac 75 mg  twice daily for 1-2 weeks.  If low back and hip pain are not improving make a follow up for imaging and possible PT referral.

## 2022-07-23 NOTE — Assessment & Plan Note (Signed)
Most likely right low back and right hip pain from back in origin possible sciatica plus or minus right hip arthritis given decreased range of motion in right hip.  We will start with diclofenac 75 mg p.o. twice daily x1 to 2 weeks.  She will start home physical therapy for low back.  If she is not improving as expected we will have her return in 2 weeks for lumbar and right hip film as well as possible referral to physical therapy

## 2022-07-23 NOTE — Assessment & Plan Note (Signed)
Stable, chronic.  Continue current medication.   Tolerable control on Trulicity 1.5 mg weekly and metformin XR 500 mg 2 tablets daily

## 2022-07-23 NOTE — Assessment & Plan Note (Signed)
Encouraged exercise, weight loss, healthy eating habits. ? ?

## 2022-07-24 LAB — MICROALBUMIN / CREATININE URINE RATIO
Creatinine,U: 113.5 mg/dL
Microalb Creat Ratio: 17.5 mg/g (ref 0.0–30.0)
Microalb, Ur: 19.9 mg/dL — ABNORMAL HIGH (ref 0.0–1.9)

## 2022-09-12 ENCOUNTER — Other Ambulatory Visit: Payer: Self-pay | Admitting: Family Medicine

## 2022-09-24 ENCOUNTER — Other Ambulatory Visit: Payer: Self-pay | Admitting: Family Medicine

## 2022-09-24 NOTE — Telephone Encounter (Signed)
Last office visit 07/23/22 for CPE.  Last refilled 07/23/22 for #30 with no refills.  No future appointments.

## 2022-10-26 ENCOUNTER — Other Ambulatory Visit: Payer: Self-pay | Admitting: Family Medicine

## 2022-11-10 ENCOUNTER — Telehealth: Payer: Self-pay | Admitting: Family Medicine

## 2022-11-10 NOTE — Telephone Encounter (Signed)
Patient called in and stated that Dulaglutide (TRULICITY) 1.5 0000000 SOPN is on back order until February 24 and she suppose to take it on February 18. She wanted to know if it ok to miss a week or what should she do. She has checked her mail order and local pharmacy both are on back order. Please advise. Thank you!

## 2022-11-10 NOTE — Telephone Encounter (Signed)
Call They would be okay to miss doses until February 24.  If she is uncomfortable this I can certainly send in a lower dose  ( 0.75 mg weekly)for her to take 2 injections of  in meantime.

## 2022-11-11 NOTE — Telephone Encounter (Signed)
Mrs. Joncas notified as instructed by telephone.  She is okay missing one dose but if for some reason the Trulicity is not available on 11/21/22, she will call back for a Rx for the 0.75 mg.

## 2022-11-19 DIAGNOSIS — E1165 Type 2 diabetes mellitus with hyperglycemia: Secondary | ICD-10-CM | POA: Diagnosis not present

## 2022-11-19 DIAGNOSIS — H25093 Other age-related incipient cataract, bilateral: Secondary | ICD-10-CM | POA: Diagnosis not present

## 2022-11-19 LAB — HM DIABETES EYE EXAM

## 2022-11-23 ENCOUNTER — Telehealth: Payer: Self-pay | Admitting: Family Medicine

## 2022-11-23 NOTE — Telephone Encounter (Signed)
Call pt have her make a virtual appt to discuss med alternatives.

## 2022-11-23 NOTE — Telephone Encounter (Signed)
Pt called stating she has been out of Dulaglutide (TRULICITY) 1.5 0000000 SOPN for the last 2 weeks. Pt states her preferred pharmacy,  [facility] [place], [place], is out of stock, along with other local pharmacies. Pt Is asking can another prescription for a similar medication be prescribed so she can start back up the meds? Pt mentioned if someone tries contacting her regarding the issue, she'll be attending a funeral @ 1pm & a vm can be left. Call back # [phone]

## 2022-11-23 NOTE — Telephone Encounter (Addendum)
Please see phone call note below access nurse. Sending to Dr Diona Browner and Trail pool.     [place] Night - Client TELEPHONE ADVICE RECORD AccessNurse Patient Name: Terri [name] Gender: Female DOB: 12-08-1950 Age: 72 Y 3 M 5 D Return Phone Number: [phone] (Primary) Address: City/ State/ Zip: [place] [address] Client [facility] Night - Client Client Site [place] Provider Eliezer Lofts - MD Contact Type Call Who Is Calling Patient / Member / Family / Caregiver Call Type Triage / Clinical Relationship To Patient Self Return Phone Number [phone] (Primary) Chief Complaint Prescription Refill or Medication Request (non symptomatic) Reason for Call Medication Question / Request Initial Comment Caller is needing her medication, no symptoms. Translation No Nurse Assessment Nurse: [name], RN, [name] Date/Time (Eastern Time): 11/21/2022 12:51:40 PM Confirm and document reason for call. If symptomatic, describe symptoms. ---States she needs refill of Trulicity, states she uses mail order and it is on back order. Can't find med at any other pharmacy. Also takes oral Metformin 1000 mg. Denies symptoms. Does the patient have any new or worsening symptoms? ---No Please document clinical information provided and list any resource used. ---Advised caller to contact office during regular business hours on Monday Disp. Time [name] Time) Disposition Final User 11/21/2022 1:01:26 PM Clinical Call Yes [name], RN, [name] Final Disposition 11/21/2022 1:01:26 PM Clinical Call Yes [name], RN, [name]

## 2022-11-24 ENCOUNTER — Encounter: Payer: Self-pay | Admitting: Family Medicine

## 2022-11-24 NOTE — Telephone Encounter (Signed)
Tried to reach [name].  No answer.  Will try again later.

## 2022-11-24 NOTE — Telephone Encounter (Signed)
Virtual appointment scheduled 11/27/2022 at 10:20 am with Dr. Diona Browner.

## 2022-11-27 ENCOUNTER — Telehealth (INDEPENDENT_AMBULATORY_CARE_PROVIDER_SITE_OTHER): Payer: Medicare Other | Admitting: Family Medicine

## 2022-11-27 ENCOUNTER — Encounter: Payer: Self-pay | Admitting: Family Medicine

## 2022-11-27 VITALS — Ht 66.0 in | Wt 225.0 lb

## 2022-11-27 DIAGNOSIS — E1159 Type 2 diabetes mellitus with other circulatory complications: Secondary | ICD-10-CM | POA: Diagnosis not present

## 2022-11-27 MED ORDER — TRULICITY 0.75 MG/0.5ML ~~LOC~~ SOAJ: 0.7500 mg | SUBCUTANEOUS | 11 refills | Status: DC

## 2022-11-27 NOTE — Assessment & Plan Note (Signed)
Chronic, associated with hypertension  She had significant improvement in diabetes control with Trulicity but she has been unable to find Trulicity at the pharmacy at the 1.5 mg weekly dose. We discussed different options including increasing the dose, administering 2 injections/week or reducing the dose. At this point in time she has chosen to reduce the dose back to A999333 mg weekly Trulicity and to work on aggressive lifestyle changes.  She will be due for repeat A1c in 1 month and she will call to make the follow-up appointment.

## 2022-11-27 NOTE — Progress Notes (Signed)
VIRTUAL VISIT A virtual visit is felt to be most appropriate for this patient at this time.   I connected with the patient on 11/27/22 at 10:20 AM EST by virtual telehealth platform and verified that I am speaking with the correct person using two identifiers.   I discussed the limitations, risks, security and privacy concerns of performing an evaluation and management service by  virtual telehealth platform and the availability of in person appointments. I also discussed with the patient that there may be a patient responsible charge related to this service. The patient expressed understanding and agreed to proceed.  Patient location: Home Provider Location: [place] Participants: Terri [name] and Gwenith Daily   Chief Complaint  Patient presents with   Diabetes    Not able to get Trulicity     History of Present Illness:  72 y.o. female patient of Terri Ealey E, MD presents for DM  Diabetes:  She  is not able to get Trulicity.. not available at pharmacy. She had significant improvement in diabetes on Trulicity and now blood sugars are no longer in control Lab Results  Component Value Date   HGBA1C 7.1 (H) 07/15/2022   Using medications without difficulties: Hypoglycemic episodes: none Hyperglycemic episodes: none Feet problems: None Blood Sugars averaging: fbs [id number] off Trulicity for 3 weeks,  on Trulicity FBS were [id number] eye exam within last year: Up-to-date   COVID 19 screen No recent travel or known exposure to Terri [name] The patient denies respiratory symptoms of COVID 19 at this time.  The importance of social distancing was discussed today.   Review of Systems  Constitutional:  Negative for chills and fever.  HENT:  Negative for congestion and ear pain.   Eyes:  Negative for pain and redness.  Respiratory:  Negative for cough and shortness of breath.   Cardiovascular:  Negative for chest pain, palpitations and leg swelling.  Gastrointestinal:  Negative for  abdominal pain, blood in stool, constipation, diarrhea, nausea and vomiting.  Genitourinary:  Negative for dysuria.  Musculoskeletal:  Negative for falls and myalgias.  Skin:  Negative for rash.  Neurological:  Negative for dizziness.  Psychiatric/Behavioral:  Negative for depression. The patient is not nervous/anxious.       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 She has never used smokeless tobacco. She reports that she does not drink alcohol and does not use drugs.   Current Outpatient Medications:    amLODipine (NORVASC) 10 MG tablet, TAKE 1 TABLET BY MOUTH DAILY, Disp: 90 tablet, Rfl: 3   aspirin 81 MG tablet, Take 81 mg by mouth daily., Disp: , Rfl:    atorvastatin (LIPITOR) 40 MG tablet, TAKE 1 TABLET BY MOUTH DAILY, Disp: 90 tablet, Rfl: 2   Blood Glucose Monitoring Suppl (ONETOUCH VERIO IQ SYSTEM) w/Device KIT, , Disp: , Rfl:    Calcium Carbonate (CALCIUM 600 PO), Take 1 tablet by mouth daily., Disp: , Rfl:    Cholecalciferol (VITAMIN D3 PO), Take 2,000 Units by  mouth daily., Disp: , Rfl:    diclofenac (VOLTAREN) 75 MG EC tablet, TAKE 1 TABLET BY MOUTH TWICE A DAY, Disp: 30 tablet, Rfl: 0   glucose blood (ONETOUCH VERIO) test strip, USE TO CHECK BLOOD SUGAR DAILY  DX: E11.65, Disp: 100 each, Rfl: 3   Lancets (ONETOUCH ULTRASOFT) lancets, USE TO CHECK FASTING BLOOD SUGAR DAILY  DX: E11.65, Disp: 100 each, Rfl: 3   lisinopril (ZESTRIL) 20 MG tablet, TAKE 1 TABLET BY MOUTH DAILY, Disp: 90 tablet, Rfl: 3   Lysine 500 MG TABS, Take 1 tablet by mouth daily as  needed., Disp: , Rfl:    metFORMIN (GLUCOPHAGE-XR) 500 MG 24 hr tablet, TAKE 2 TABLETS BY MOUTH DAILY WITH BREAKFAST, Disp: 180 tablet, Rfl: 3   nortriptyline (PAMELOR) 10 MG capsule, Take 1 capsule (10 mg total) by mouth at bedtime., Disp: 90 capsule, Rfl: 0   nystatin cream (MYCOSTATIN), Apply 1 application topically 2 (two) times daily as needed for dry skin., Disp: 30 g, Rfl: 0   Dulaglutide (TRULICITY) A999333 0000000 SOPN, Inject 0.75 mg into the skin once a week., Disp: 6 mL, Rfl: 11   Observations/Objective: Height '5\' 6"'$  (1.676 m), weight 225 lb (102.1 kg).  Physical Exam Constitutional:      General: The patient is not in acute distress. Pulmonary:     Effort: Pulmonary effort is normal. No respiratory distress.  Neurological:     Mental Status: The patient is alert and oriented to person, place, and time.  Psychiatric:        Mood and Affect: Mood normal.        Behavior: Behavior normal.    Assessment and Plan Type 2 diabetes mellitus with other circulatory complications HTN (HCC) Assessment & Plan: Chronic, associated with hypertension  She had significant improvement in diabetes control with Trulicity but she has been unable to find Trulicity at the pharmacy at the 1.5 mg weekly dose. We discussed different options including increasing the dose, administering 2 injections/week or reducing the dose. At this point in time she has chosen to reduce the dose back to A999333 mg weekly Trulicity and to work on aggressive lifestyle changes.  She will be due for repeat A1c in 1 month and she will call to make the follow-up appointment.   Other orders -     Trulicity; Inject 0.75 mg into the skin once a week.  Dispense: 6 mL; Refill: 11      I discussed the assessment and treatment plan with the patient. The patient was provided an opportunity to ask questions and all were answered. The patient agreed with the plan and demonstrated an understanding of the instructions.   The  patient was advised to call back or seek an in-person evaluation if the symptoms worsen or if the condition fails to improve as anticipated.     Terri Lofts, MD

## 2022-12-03 ENCOUNTER — Telehealth: Payer: Self-pay | Admitting: Family Medicine

## 2022-12-03 ENCOUNTER — Other Ambulatory Visit: Payer: Self-pay | Admitting: Family Medicine

## 2022-12-03 DIAGNOSIS — E1159 Type 2 diabetes mellitus with other circulatory complications: Secondary | ICD-10-CM

## 2022-12-03 NOTE — Telephone Encounter (Signed)
Spoke with Terri [name].  She was calling to schedule her follow up appointment with Dr. Diona Browner for April.  Fasting lab appointment scheduled for 01/05/23 and with Dr. Diona Browner on 01/07/2023.  Will forward to Dr. Diona Browner to place future lab orders.

## 2022-12-03 NOTE — Telephone Encounter (Signed)
Pt called asking for Dr. Diona Browner to submit lab orders for A1C? Call back # [phone]

## 2022-12-29 ENCOUNTER — Telehealth: Payer: Self-pay | Admitting: Family Medicine

## 2022-12-29 NOTE — Telephone Encounter (Signed)
Called patient to schedule Medicare Annual Wellness Visit (AWV). Left message for patient to call back and schedule Medicare Annual Wellness Visit (AWV).  Last date of AWV: 12/29/2021  Please schedule an appointment at any time with [facility].  If any questions, please contact me at [phone].  Thank you ,  [place] Direct Dial: [phone]

## 2022-12-31 ENCOUNTER — Telehealth: Payer: Self-pay | Admitting: Family Medicine

## 2022-12-31 NOTE — Telephone Encounter (Signed)
Contacted Terri [name] to schedule their annual wellness visit. Appointment made for 01/21/2023.  [place] Direct Dial: [phone]

## 2023-01-05 ENCOUNTER — Other Ambulatory Visit (INDEPENDENT_AMBULATORY_CARE_PROVIDER_SITE_OTHER): Payer: Medicare Other

## 2023-01-05 DIAGNOSIS — E1159 Type 2 diabetes mellitus with other circulatory complications: Secondary | ICD-10-CM

## 2023-01-05 LAB — COMPREHENSIVE METABOLIC PANEL
ALT: 21 U/L (ref 0–35)
AST: 18 U/L (ref 0–37)
Albumin: 4 g/dL (ref 3.5–5.2)
Alkaline Phosphatase: 108 U/L (ref 39–117)
BUN: 10 mg/dL (ref 6–23)
CO2: 32 mEq/L (ref 19–32)
Calcium: 9.3 mg/dL (ref 8.4–10.5)
Chloride: 103 mEq/L (ref 96–112)
Creatinine, Ser: 0.69 mg/dL (ref 0.40–1.20)
GFR: 86.12 mL/min (ref >60.00)
Glucose, Bld: 111 mg/dL — ABNORMAL HIGH (ref 70–99)
Potassium: 3.9 mEq/L (ref 3.5–5.1)
Sodium: 143 mEq/L (ref 135–145)
Total Bilirubin: 0.6 mg/dL (ref 0.2–1.2)
Total Protein: 6.5 g/dL (ref 6.0–8.3)

## 2023-01-05 LAB — LIPID PANEL
Cholesterol: 146 mg/dL (ref 0–200)
HDL: 50.1 mg/dL (ref >39.00)
LDL Cholesterol: 75 mg/dL (ref 0–99)
NonHDL: 96.05
Total CHOL/HDL Ratio: 3
Triglycerides: 106 mg/dL (ref 0.0–149.0)
VLDL: 21.2 mg/dL (ref 0.0–40.0)

## 2023-01-05 LAB — HEMOGLOBIN A1C: Hgb A1c MFr Bld: 7.1 % — ABNORMAL HIGH (ref 4.6–6.5)

## 2023-01-05 NOTE — Progress Notes (Signed)
No critical labs need to be addressed urgently. We will discuss labs in detail at upcoming office visit.   

## 2023-01-07 ENCOUNTER — Ambulatory Visit: Payer: Medicare Other | Admitting: Family Medicine

## 2023-01-07 ENCOUNTER — Ambulatory Visit (INDEPENDENT_AMBULATORY_CARE_PROVIDER_SITE_OTHER)
Admission: RE | Admit: 2023-01-07 | Discharge: 2023-01-07 | Disposition: A | Discharge status: Home / Self Care | Payer: Medicare Other | Source: Ambulatory Visit | Attending: Family Medicine | Admitting: Family Medicine

## 2023-01-07 ENCOUNTER — Ambulatory Visit (INDEPENDENT_AMBULATORY_CARE_PROVIDER_SITE_OTHER): Payer: Medicare Other | Admitting: Family Medicine

## 2023-01-07 ENCOUNTER — Encounter: Payer: Self-pay | Admitting: Family Medicine

## 2023-01-07 VITALS — BP 120/62 | HR 83 | Temp 97.8°F | Ht 66.0 in | Wt 223.2 lb

## 2023-01-07 DIAGNOSIS — M21941 Unspecified acquired deformity of hand, right hand: Secondary | ICD-10-CM | POA: Diagnosis not present

## 2023-01-07 DIAGNOSIS — E1159 Type 2 diabetes mellitus with other circulatory complications: Secondary | ICD-10-CM

## 2023-01-07 DIAGNOSIS — E1169 Type 2 diabetes mellitus with other specified complication: Secondary | ICD-10-CM | POA: Diagnosis not present

## 2023-01-07 DIAGNOSIS — E785 Hyperlipidemia, unspecified: Secondary | ICD-10-CM | POA: Diagnosis not present

## 2023-01-07 DIAGNOSIS — M21949 Unspecified acquired deformity of hand, unspecified hand: Secondary | ICD-10-CM | POA: Diagnosis not present

## 2023-01-07 DIAGNOSIS — M19041 Primary osteoarthritis, right hand: Secondary | ICD-10-CM | POA: Diagnosis not present

## 2023-01-07 DIAGNOSIS — I152 Hypertension secondary to endocrine disorders: Secondary | ICD-10-CM

## 2023-01-07 LAB — SEDIMENTATION RATE: Sed Rate: 12 mm/hr (ref 0–30)

## 2023-01-07 LAB — C-REACTIVE PROTEIN: CRP: <1 mg/dL (ref 0.5–20.0)

## 2023-01-07 MED ORDER — DICLOFENAC SODIUM 75 MG PO TBEC: 75.0000 mg | DELAYED_RELEASE_TABLET | Freq: Two times a day (BID) | ORAL | 0 refills | Status: DC

## 2023-01-07 MED ORDER — TRULICITY 1.5 MG/0.5ML ~~LOC~~ SOAJ: 1.5000 mg | SUBCUTANEOUS | 3 refills | Status: DC

## 2023-01-07 NOTE — Progress Notes (Signed)
Patient ID: Terri [name], female    DOB: 01-12-1951, 72 y.o.   MRN: [id number]  This visit was conducted in person.  BP 120/62   Pulse 83   Temp 97.8 F (36.6 C) (Temporal)   Ht 5\' 6"  (1.676 m)   Wt 223 lb 4 oz (101.3 kg)   SpO2 99%   BMI 36.03 kg/m    CC:  Chief Complaint  Patient presents with   Diabetes    Subjective:   HPI: Terri CraneLillie M Dix is a 72 y.o. female presenting on 01/07/2023 for Diabetes   Hypertension:  Well-controlled on lisinopril 20 mg daily, amlodipine 10 mg p.o. daily BP Readings from Last 3 Encounters:  01/07/23 120/62  07/23/22 120/60  02/10/22 120/72  Using medication without problems or lightheadedness:  none Chest pain with exertion: none Edema: none Short of breath: none Average home BPs: Other issues:  Elevated Cholesterol: LDL almost at goal less than 70 on atorvastatin 40 mg daily Lab Results  Component Value Date   CHOL 146 01/05/2023   HDL 50.10 01/05/2023   LDLCALC 75 01/05/2023   LDLDIRECT 63.3 11/22/2013   TRIG 106.0 01/05/2023   CHOLHDL 3 01/05/2023  Using medications without problems: none Muscle aches:  none Diet compliance: heart healthy, trying Exercise: 4 times a week Other complaints:  Diabetes: Tolerable control on Trulicity 0.75 mg weekly and metformin XR 500 mg 2 tablets daily Lab Results  Component Value Date   HGBA1C 7.1 (H) 01/05/2023  Using medications without difficulties: none Hypoglycemic episodes: none Hyperglycemic episodes: none Feet problems: none Blood Sugars averaging:  occ eye exam within last year: year  Obesity Body mass index is 36.03 kg/m. Wt Readings from Last 3 Encounters:  01/07/23 223 lb 4 oz (101.3 kg)  11/27/22 225 lb (102.1 kg)  07/23/22 225 lb (102.1 kg)         Relevant past medical, surgical, family and social history reviewed and updated as indicated. Interim medical history since our last visit reviewed. Allergies and medications reviewed and updated. Outpatient  Medications Prior to Visit  Medication Sig Dispense Refill   amLODipine (NORVASC) 10 MG tablet TAKE 1 TABLET BY MOUTH DAILY 90 tablet 3   aspirin 81 MG tablet Take 81 mg by mouth daily.     atorvastatin (LIPITOR) 40 MG tablet TAKE 1 TABLET BY MOUTH DAILY 90 tablet 2   Blood Glucose Monitoring Suppl (ONETOUCH VERIO IQ SYSTEM) w/Device KIT      Calcium Carbonate (CALCIUM 600 PO) Take 1 tablet by mouth daily.     Cholecalciferol (VITAMIN D3 PO) Take 2,000 Units by mouth daily.     glucose blood (ONETOUCH VERIO) test strip USE TO CHECK BLOOD SUGAR DAILY  DX: E11.65 100 each 3   Lancets (ONETOUCH ULTRASOFT) lancets USE TO CHECK FASTING BLOOD SUGAR DAILY  DX: E11.65 100 each 3   lisinopril (ZESTRIL) 20 MG tablet TAKE 1 TABLET BY MOUTH DAILY 90 tablet 3   Lysine 500 MG TABS Take 1 tablet by mouth daily as needed.     metFORMIN (GLUCOPHAGE-XR) 500 MG 24 hr tablet TAKE 2 TABLETS BY MOUTH DAILY WITH BREAKFAST 180 tablet 3   nortriptyline (PAMELOR) 10 MG capsule Take 1 capsule (10 mg total) by mouth at bedtime. 90 capsule 0   nystatin cream (MYCOSTATIN) Apply 1 application topically 2 (two) times daily as needed for dry skin. 30 g 0   diclofenac (VOLTAREN) 75 MG EC tablet TAKE 1 TABLET BY MOUTH TWICE A  DAY 30 tablet 0   Dulaglutide (TRULICITY) 0.75 MG/0.5ML SOPN Inject 0.75 mg into the skin once a week. 6 mL 11   No facility-administered medications prior to visit.     Per HPI unless specifically indicated in ROS section below Review of Systems  Constitutional:  Negative for fatigue and fever.  HENT:  Negative for congestion.   Eyes:  Negative for pain.  Respiratory:  Negative for cough and shortness of breath.   Cardiovascular:  Negative for chest pain, palpitations and leg swelling.  Gastrointestinal:  Negative for abdominal pain.  Genitourinary:  Negative for dysuria and vaginal bleeding.  Musculoskeletal:  Negative for back pain.  Neurological:  Negative for syncope, light-headedness and  headaches.  Psychiatric/Behavioral:  Negative for dysphoric mood.    Objective:  BP 120/62   Pulse 83   Temp 97.8 F (36.6 C) (Temporal)   Ht 5\' 6"  (1.676 m)   Wt 223 lb 4 oz (101.3 kg)   SpO2 99%   BMI 36.03 kg/m   Wt Readings from Last 3 Encounters:  01/07/23 223 lb 4 oz (101.3 kg)  11/27/22 225 lb (102.1 kg)  07/23/22 225 lb (102.1 kg)      Physical Exam Constitutional:      General: She is not in acute distress.    Appearance: Normal appearance. She is well-developed. She is not ill-appearing or toxic-appearing.  HENT:     Head: Normocephalic.     Right Ear: Hearing, tympanic membrane, ear canal and external ear normal. Tympanic membrane is not erythematous, retracted or bulging.     Left Ear: Hearing, tympanic membrane, ear canal and external ear normal. Tympanic membrane is not erythematous, retracted or bulging.     Nose: No mucosal edema or rhinorrhea.     Right Sinus: No maxillary sinus tenderness or frontal sinus tenderness.     Left Sinus: No maxillary sinus tenderness or frontal sinus tenderness.     Mouth/Throat:     Pharynx: Uvula midline.  Eyes:     General: Lids are normal. Lids are everted, no foreign bodies appreciated.     Conjunctiva/sclera: Conjunctivae normal.     Pupils: Pupils are equal, round, and reactive to light.  Neck:     Thyroid: No thyroid mass or thyromegaly.     Vascular: No carotid bruit.     Trachea: Trachea normal.  Cardiovascular:     Rate and Rhythm: Normal rate and regular rhythm.     Pulses: Normal pulses.     Heart sounds: Normal heart sounds, S1 normal and S2 normal. No murmur heard.    No friction rub. No gallop.  Pulmonary:     Effort: Pulmonary effort is normal. No tachypnea or respiratory distress.     Breath sounds: Normal breath sounds. No decreased breath sounds, wheezing, rhonchi or rales.  Abdominal:     General: Bowel sounds are normal.     Palpations: Abdomen is soft.     Tenderness: There is no abdominal  tenderness.  Musculoskeletal:     Cervical back: Normal range of motion and neck supple.  Skin:    General: Skin is warm and dry.     Findings: No rash.  Neurological:     Mental Status: She is alert.  Psychiatric:        Mood and Affect: Mood is not anxious or depressed.        Speech: Speech normal.        Behavior: Behavior normal. Behavior is cooperative.  Thought Content: Thought content normal.        Judgment: Judgment normal.       Results for orders placed or performed in visit on 01/05/23  Comprehensive metabolic panel  Result Value Ref Range   Sodium 143 135 - 145 mEq/L   Potassium 3.9 3.5 - 5.1 mEq/L   Chloride 103 96 - 112 mEq/L   CO2 32 19 - 32 mEq/L   Glucose, Bld 111 (H) 70 - 99 mg/dL   BUN 10 6 - 23 mg/dL   Creatinine, Ser 1.01 0.40 - 1.20 mg/dL   Total Bilirubin 0.6 0.2 - 1.2 mg/dL   Alkaline Phosphatase 108 39 - 117 U/L   AST 18 0 - 37 U/L   ALT 21 0 - 35 U/L   Total Protein 6.5 6.0 - 8.3 g/dL   Albumin 4.0 3.5 - 5.2 g/dL   GFR 75.10 >25.85 mL/min   Calcium 9.3 8.4 - 10.5 mg/dL  Lipid panel  Result Value Ref Range   Cholesterol 146 0 - 200 mg/dL   Triglycerides 277.8 0.0 - 149.0 mg/dL   HDL 24.23 >53.61 mg/dL   VLDL 44.3 0.0 - 15.4 mg/dL   LDL Cholesterol 75 0 - 99 mg/dL   Total CHOL/HDL Ratio 3    NonHDL 96.05   Hemoglobin A1c  Result Value Ref Range   Hgb A1c MFr Bld 7.1 (H) 4.6 - 6.5 %     COVID 19 screen:  No recent travel or known exposure to COVID19 The patient denies respiratory symptoms of COVID 19 at this time. The importance of social distancing was discussed today.   Assessment and Plan The patient's preventative maintenance and recommended screening tests for an annual wellness exam were reviewed in full today. Brought up to date unless services declined.  Counselled on the importance of diet, exercise, and its role in overall health and mortality. The patient's FH and SH was reviewed, including their home life, tobacco  status, and drug and alcohol status.    Vaccines: uptodate Has had COVID19 vaccines x 2 consider shingrix series, COVID #3. Given HD flu shot today. Pap/DVE:   S/P TAH.  Mammo:  Stable 03/23/2022, Hx of breast cancer right arm lymphadenopathy. Bone Density: 9/26/ 2019 normal. Repeat in 5 years. Colon:  01/2018 no polyps,repeat in 10- year, Dr. Myrtie Neither Smoking Status: none ETOH/ drug use: none/none  Hep C:  done   Problem List Items Addressed This Visit     Deformity of hand    Chronic, concerning for rheumatoid arthritis changes in hands.  In discussion with patient multiple family members with similar deformity in hands but no known diagnosis of rheumatoid arthritis. She denies redness and pain in her hands but continues to notice progressive deformity.  She does have pain in her right hip and buttock that is controlled with diclofenac 75 mg p.o. twice daily.  Will evaluate with labs for autoimmune arthritis. Will will check right hand film to determine if changes appear more consistent with osteoarthritis versus erosions from rheumatoid arthritis.      Relevant Orders   DG Hand Complete Right   ANA   C-reactive protein   Cyclic citrul peptide antibody, IgG   Rheumatoid factor   Sedimentation rate   Hyperlipidemia associated with type 2 diabetes mellitus (Chronic)   Relevant Medications   Dulaglutide (TRULICITY) 1.5 MG/0.5ML SOPN   Hypertension associated with diabetes (Chronic)   Relevant Medications   Dulaglutide (TRULICITY) 1.5 MG/0.5ML SOPN   Type 2 diabetes  mellitus with other circulatory complications HTN (HCC) - Primary (Chronic)   Relevant Medications   Dulaglutide (TRULICITY) 1.5 MG/0.5ML SOPN    Kerby Nora, MD

## 2023-01-07 NOTE — Patient Instructions (Addendum)
Increase Trulicity if able to find up to 1.5 mg weekly. If not available , continue 0.75 mg weekly.  Please stop at the lab to have labs drawn and X-ray done.. I will call with results.

## 2023-01-07 NOTE — Assessment & Plan Note (Signed)
Chronic, concerning for rheumatoid arthritis changes in hands.  In discussion with patient multiple family members with similar deformity in hands but no known diagnosis of rheumatoid arthritis. She denies redness and pain in her hands but continues to notice progressive deformity.  She does have pain in her right hip and buttock that is controlled with diclofenac 75 mg p.o. twice daily.  Will evaluate with labs for autoimmune arthritis. Will will check right hand film to determine if changes appear more consistent with osteoarthritis versus erosions from rheumatoid arthritis.

## 2023-01-08 LAB — ANTI-NUCLEAR AB-TITER (ANA TITER): ANA Titer 1: 1:80 {titer} — ABNORMAL HIGH

## 2023-01-09 LAB — CYCLIC CITRUL PEPTIDE ANTIBODY, IGG: Cyclic Citrullin Peptide Ab: <16 UNITS

## 2023-01-09 LAB — RHEUMATOID FACTOR: Rheumatoid fact SerPl-aCnc: <14 IU/mL (ref <14)

## 2023-01-09 LAB — ANTI-NUCLEAR AB-TITER (ANA TITER)

## 2023-01-09 LAB — ANA: Anti Nuclear Antibody (ANA): POSITIVE — AB

## 2023-01-15 ENCOUNTER — Encounter: Payer: Self-pay | Admitting: Family Medicine

## 2023-01-15 DIAGNOSIS — M21949 Unspecified acquired deformity of hand, unspecified hand: Secondary | ICD-10-CM

## 2023-01-15 DIAGNOSIS — R768 Other specified abnormal immunological findings in serum: Secondary | ICD-10-CM

## 2023-01-21 ENCOUNTER — Ambulatory Visit (INDEPENDENT_AMBULATORY_CARE_PROVIDER_SITE_OTHER): Payer: Medicare Other

## 2023-01-21 VITALS — Ht 66.0 in | Wt 223.0 lb

## 2023-01-21 DIAGNOSIS — Z0000 Encounter for general adult medical examination without abnormal findings: Secondary | ICD-10-CM | POA: Diagnosis not present

## 2023-01-21 NOTE — Progress Notes (Signed)
I connected with  Terri [name] on 01/21/23 by a audio enabled telemedicine application and verified that I am speaking with the correct person using two identifiers.  Patient Location: Home  Provider Location: Home Office  I discussed the limitations of evaluation and management by telemedicine. The patient expressed understanding and agreed to proceed.  Subjective:   Terri [name] is a 72 y.o. female who presents for Medicare Annual (Subsequent) preventive examination.  Review of Systems      Cardiac Risk Factors include: advanced age (>34men, >72 women);hypertension;diabetes mellitus;sedentary lifestyle     Objective:    Today's Vitals   01/21/23 0841  Weight: 223 lb (101.2 kg)  Height:  (1.676 m)   Body mass index is 35.99 kg/m.     01/21/2023    8:54 AM 12/29/2021    8:53 AM 11/30/2016    9:26 AM  Advanced Directives  Does Patient Have a Medical Advance Directive? Yes Yes Yes  Type of Estate agent of [place];Living will Healthcare Power of [place];Living will Healthcare Power of [place];Living will  Copy of Healthcare Power of Attorney in Chart? No - copy requested No - copy requested No - copy requested    Current Medications (verified) Outpatient Encounter Medications as of 01/21/2023  Medication Sig   amLODipine (NORVASC) 10 MG tablet TAKE 1 TABLET BY MOUTH DAILY   aspirin 81 MG tablet Take 81 mg by mouth daily.   atorvastatin (LIPITOR) 40 MG tablet TAKE 1 TABLET BY MOUTH DAILY   Blood Glucose Monitoring Suppl (ONETOUCH VERIO IQ SYSTEM) w/Device KIT    Calcium Carbonate (CALCIUM 600 PO) Take 1 tablet by mouth daily.   Cholecalciferol (VITAMIN D3 PO) Take 2,000 Units by mouth daily.   diclofenac (VOLTAREN) 75 MG EC tablet Take 1 tablet (75 mg total) by mouth 2 (two) times daily.   Dulaglutide (TRULICITY) 1.5 MG/0.5ML SOPN Inject 1.5 mg into the skin once a week.   glucose blood (ONETOUCH VERIO) test strip USE TO CHECK BLOOD SUGAR  DAILY  DX: E11.65   Lancets (ONETOUCH ULTRASOFT) lancets USE TO CHECK FASTING BLOOD SUGAR DAILY  DX: E11.65   lisinopril (ZESTRIL) 20 MG tablet TAKE 1 TABLET BY MOUTH DAILY   Lysine 500 MG TABS Take 1 tablet by mouth daily as needed.   metFORMIN (GLUCOPHAGE-XR) 500 MG 24 hr tablet TAKE 2 TABLETS BY MOUTH DAILY WITH BREAKFAST   nystatin cream (MYCOSTATIN) Apply 1 application topically 2 (two) times daily as needed for dry skin.   nortriptyline (PAMELOR) 10 MG capsule Take 1 capsule (10 mg total) by mouth at bedtime. (Patient not taking: Reported on 01/21/2023)   No facility-administered encounter medications on file as of 01/21/2023.    Allergies (verified) Metoclopramide and Morphine   History: Past Medical History:  Diagnosis Date   Abdominal mass    Abnormal coagulation profile    Arthritis    CAD (coronary artery disease)    Diabetes mellitus type II, uncontrolled    GERD (gastroesophageal reflux disease)    Hyperlipidemia    Hypertension    Mesenteric mass 07/2008   complicated by nausea & vomiting   Other postoperative infection    S/P PICC central line placement 10/2008   line sepsis-right PICC line removed    Past Surgical History:  Procedure Laterality Date   ABDOMINAL MASS RESECTION  07/2008   Mesentertic Mass resection   BREAST SURGERY Right 01/27/2016   [facility]   TOTAL ABDOMINAL HYSTERECTOMY  1993   Family  History  Problem Relation Age of Onset   Coronary artery disease Mother    Hypertension Mother    Arthritis Mother    Brain cancer Father 32       deceased   Arthritis Father    Diabetes Sister    Diabetes Brother         X 2   Coronary artery disease Brother    Prostate cancer Brother    Diabetes Brother    Colon cancer Neg Hx    Stomach cancer Neg Hx    Social History   Socioeconomic History   Marital status: Married    Spouse name: Not on file   Number of children: Not on file   Years of education: Not on file   Highest education level: Not on  file  Occupational History   Not on file  Tobacco Use   Smoking status: Never   Smokeless tobacco: Never  Vaping Use   Vaping Use: Never used  Substance and Sexual Activity   Alcohol use: No    Alcohol/week: 0.0 standard drinks of alcohol   Drug use: No   Sexual activity: Yes  Other Topics Concern   Not on file  Social History Narrative   Occupation:  retired      Married - two grown children      Never Smoked      Alcohol use-no            Social Determinants of Corporate investment banker Strain: Low Risk  (01/21/2023)   Overall Financial Resource Strain (CARDIA)    Difficulty of Paying Living Expenses: Not hard at all  Food Insecurity: No Food Insecurity (01/21/2023)   Hunger Vital Sign    Worried About Running Out of Food in the Last Year: Never true    Ran Out of Food in the Last Year: Never true  Transportation Needs: No Transportation Needs (01/21/2023)   PRAPARE - Administrator, Civil Service (Medical): No    Lack of Transportation (Non-Medical): No  Physical Activity: Sufficiently Active (01/21/2023)   Exercise Vital Sign    Days of Exercise per Week: 4 days    Minutes of Exercise per Session: 60 min  Stress: No Stress Concern Present (01/21/2023)   [employer] of Occupational Health - Occupational Stress Questionnaire    Feeling of Stress : Not at all  Social Connections: Moderately Isolated (01/21/2023)   Social Connection and Isolation Panel [NHANES]    Frequency of Communication with Friends and Family: More than three times a week    Frequency of Social Gatherings with Friends and Family: Twice a week    Attends Religious Services: Never    Database administrator or Organizations: No    Attends Engineer, structural: Never    Marital Status: Married    Tobacco Counseling Counseling given: Not Answered   Clinical Intake:  Pre-visit preparation completed: Yes  Pain : No/denies pain     Nutritional Risks: None Diabetes:  Yes CBG done?: No Did pt. bring in CBG monitor from home?: No  How often do you need to have someone help you when you read instructions, pamphlets, or other written materials from your doctor or pharmacy?: 1 - Never  Diabetic?Nutrition Risk Assessment:  Has the patient had any N/V/D within the last 2 months?  No  Does the patient have any non-healing wounds?  No  Has the patient had any unintentional weight loss or weight gain?  No   Diabetes:  Is the patient diabetic?  Yes  If diabetic, was a CBG obtained today?  No  Did the patient bring in their glucometer from home?  No  How often do you monitor your CBG's? BID.   Financial Strains and Diabetes Management:  Are you having any financial strains with the device, your supplies or your medication? No .  Does the patient want to be seen by Chronic Care Management for management of their diabetes?  No  Would the patient like to be referred to a Nutritionist or for Diabetic Management?  No   Diabetic Exams:  Diabetic Eye Exam: Completed 11/19/22 [facility]  Diabetic Foot Exam: Completed 10/27/21 PCP    Interpreter Needed?: No  Information entered by :: C.Tanga Gloor LPN   Activities of Daily Living    01/21/2023    8:55 AM  In your present state of health, do you have any difficulty performing the following activities:  Hearing? 0  Vision? 0  Difficulty concentrating or making decisions? 0  Walking or climbing stairs? 0  Dressing or bathing? 0  Doing errands, shopping? 0  Preparing Food and eating ? N  Using the Toilet? N  In the past six months, have you accidently leaked urine? N  Do you have problems with loss of bowel control? N  Managing your Medications? N  Managing your Finances? N  Housekeeping or managing your Housekeeping? N    Patient Care Team: Excell Seltzer, MD as PCP - General (Family Medicine) Wynetta Emery, MD as Referring Physician (Radiation Oncology) Arlie Solomons, MD as Referring Physician  (Hematology and Oncology) Roselyn Reef, OD as Consulting Physician (Optometry) Kathyrn Sheriff, [facility] as Pharmacist (Pharmacist)  Indicate any recent Medical Services you may have received from other than [facility] providers in the past year (date may be approximate).     Assessment:   This is a routine wellness examination for [place].  Hearing/Vision screen Hearing Screening - Comments:: No aids Vision Screening - Comments:: Glasses - [facility]  Dietary issues and exercise activities discussed: Current Exercise Habits: Home exercise routine, Type of exercise: walking, Time (Minutes): 60, Frequency (Times/Week): 3, Weekly Exercise (Minutes/Week): 180, Intensity: Mild, Exercise limited by: None identified   Goals Addressed             This Visit's Progress    Patient Stated       Lose 50lb pounds.       Depression Screen    01/21/2023    8:53 AM 01/07/2023   10:19 AM 11/27/2022   10:11 AM 12/29/2021    8:54 AM 02/28/2021   10:37 AM 12/08/2019   11:02 AM 03/25/2018    9:51 AM  PHQ 2/9 Scores  PHQ - 2 Score 0 0 0 0 0 0 0  PHQ- 9 Score 0  0  6      Fall Risk    01/21/2023    8:55 AM 01/07/2023   10:19 AM 12/29/2021    8:54 AM 02/28/2021   10:36 AM 12/08/2019   11:01 AM  Fall Risk   Falls in the past year? 0 0 0 0 0  Number falls in past yr: 0 0 0    Injury with Fall? 0 0 0    Risk for fall due to : No Fall Risks No Fall Risks Medication side effect    Follow up Falls prevention discussed;Falls evaluation completed Falls evaluation completed Falls evaluation completed;Education provided;Falls prevention discussed  FALL RISK PREVENTION PERTAINING TO THE HOME:  Any stairs in or around the home? Yes  If so, are there any without handrails? No  Home free of loose throw rugs in walkways, pet beds, electrical cords, etc? Yes  Adequate lighting in your home to reduce risk of falls? Yes   ASSISTIVE DEVICES UTILIZED TO PREVENT FALLS:  Life alert? No  Use of a cane, walker or  w/c? No  Grab bars in the bathroom? No  Shower chair or bench in shower? No  Elevated toilet seat or a handicapped toilet? Yes    Cognitive Function:    11/30/2016    9:26 AM  MMSE - Mini Mental State Exam  Orientation to time 5  Orientation to Place 5  Registration 3  Attention/ Calculation 0  Recall 3  Language- name 2 objects 0  Language- repeat 1  Language- follow 3 step command 3  Language- read & follow direction 0  Write a sentence 0  Copy design 0  Total score 20        01/21/2023    8:55 AM 12/29/2021    8:55 AM  6CIT Screen  What Year? 0 points 0 points  What month? 0 points 0 points  What time? 0 points 0 points  Count back from 20 0 points 0 points  Months in reverse 0 points 0 points  Repeat phrase 0 points 2 points  Total Score 0 points 2 points    Immunizations Immunization History  Administered Date(s) Administered   Fluad Quad(high Dose 65+) 07/19/2020, 07/24/2021, 07/23/2022   Influenza Whole 07/19/2008   Influenza, High Dose Seasonal PF 10/28/2016   Influenza,inj,Quad PF,6+ Mos 09/09/2017, 07/26/2018, 05/26/2019   Influenza-Unspecified 11/01/2016   PFIZER(Purple Top)SARS-COV-2 Vaccination 10/10/2019, 11/04/2019, 05/28/2020   Pneumococcal Conjugate-13 11/26/2014   Pneumococcal Polysaccharide-23 07/19/2008, 12/03/2016   Td 11/26/2014    TDAP status: Up to date  Flu Vaccine status: Up to date  Pneumococcal vaccine status: Up to date  Covid-19 vaccine status: Information provided on how to obtain vaccines.   Qualifies for Shingles Vaccine? Yes   Zostavax completed No   Shingrix Completed?: No.    Education has been provided regarding the importance of this vaccine. Patient has been advised to call insurance company to determine out of pocket expense if they have not yet received this vaccine. Advised may also receive vaccine at local pharmacy or Health Dept. Verbalized acceptance and understanding.  Screening Tests Health Maintenance  Topic  Date Due   Zoster Vaccines- Shingrix (1 of 2) Never done   COVID-19 Vaccine (4 - 2023-24 season) 05/29/2022   FOOT EXAM  10/27/2022   INFLUENZA VACCINE  04/29/2023   DEXA SCAN  06/24/2023   HEMOGLOBIN A1C  07/07/2023   Diabetic kidney evaluation - Urine ACR  07/24/2023   OPHTHALMOLOGY EXAM  11/20/2023   Diabetic kidney evaluation - eGFR measurement  01/05/2024   Medicare Annual Wellness (AWV)  01/21/2024   MAMMOGRAM  03/23/2024   DTaP/Tdap/Td (2 - Tdap) 11/25/2024   COLONOSCOPY (Pts 45-42yrs Insurance coverage will need to be confirmed)  02/02/2028   Pneumonia Vaccine 7+ Years old  Completed   Hepatitis C Screening  Completed   HPV VACCINES  Aged Out    Health Maintenance  Health Maintenance Due  Topic Date Due   Zoster Vaccines- Shingrix (1 of 2) Never done   COVID-19 Vaccine (4 - 2023-24 season) 05/29/2022   FOOT EXAM  10/27/2022    Colorectal cancer screening: Type of screening:  Colonoscopy. Completed 02/01/18. Repeat every 10 years  Mammogram status: Completed 03/23/2022. Repeat every year  Bone Density status: Completed 06/23/18. Results reflect: Bone density results: NORMAL. Repeat every 5 years. Declined  Lung Cancer Screening: (Low Dose CT Chest recommended if Age 43-80 years, 30 pack-year currently smoking OR have quit w/in 15years.) does not qualify.   Lung Cancer Screening Referral: no  Additional Screening:  Hepatitis C Screening: does qualify; Completed 11/19/15  Vision Screening: Recommended annual ophthalmology exams for early detection of glaucoma and other disorders of the eye. Is the patient up to date with their annual eye exam?  Yes  Who is the provider or what is the name of the office in which the patient attends annual eye exams? [facility] If pt is not established with a provider, would they like to be referred to a provider to establish care? No .   Dental Screening: Recommended annual dental exams for proper oral hygiene  Community Resource  Referral / Chronic Care Management: CRR required this visit?  No   CCM required this visit?  No      Plan:     I have personally reviewed and noted the following in the patient's chart:   Medical and social history Use of alcohol, tobacco or illicit drugs  Current medications and supplements including opioid prescriptions. Patient is not currently taking opioid prescriptions. Functional ability and status Nutritional status Physical activity Advanced directives List of other physicians Hospitalizations, surgeries, and ER visits in previous 12 months Vitals Screenings to include cognitive, depression, and falls Referrals and appointments  In addition, I have reviewed and discussed with patient certain preventive protocols, quality metrics, and best practice recommendations. A written personalized care plan for preventive services as well as general preventive health recommendations were provided to patient.     Maryan Puls, LPN   01/04/8118   Nurse Notes: none

## 2023-01-21 NOTE — Patient Instructions (Signed)
Terri [name] , Thank you for taking time to come for your Medicare Wellness Visit. I appreciate your ongoing commitment to your health goals. Please review the following plan we discussed and let me know if I can assist you in the future.   These are the goals we discussed:  Goals      Increase physical activity     Starting 11/30/16, I will continue to walk at least 60 min 2-3 days per week.      Patient Stated     12/29/2021, get rid of diabetes     Patient Stated     Lose 50lb pounds.        This is a list of the screening recommended for you and due dates:  Health Maintenance  Topic Date Due   Zoster (Shingles) Vaccine (1 of 2) Never done   COVID-19 Vaccine (4 - 2023-24 season) 05/29/2022   Complete foot exam   10/27/2022   Flu Shot  04/29/2023   DEXA scan (bone density measurement)  06/24/2023   Hemoglobin A1C  07/07/2023   Yearly kidney health urinalysis for diabetes  07/24/2023   Eye exam for diabetics  11/20/2023   Yearly kidney function blood test for diabetes  01/05/2024   Medicare Annual Wellness Visit  01/21/2024   Mammogram  03/23/2024   DTaP/Tdap/Td vaccine (2 - Tdap) 11/25/2024   Colon Cancer Screening  02/02/2028   Pneumonia Vaccine  Completed   Hepatitis C Screening: USPSTF Recommendation to screen - Ages 64-79 yo.  Completed   HPV Vaccine  Aged Out    Advanced directives: none  Conditions/risks identified: none  Next appointment: Follow up in one year for your annual wellness visit 01/25/24 @ 10:30 televist   Preventive Care 65 Years and Older, Female Preventive care refers to lifestyle choices and visits with your health care provider that can promote health and wellness. What does preventive care include? A yearly physical exam. This is also called an annual well check. Dental exams once or twice a year. Routine eye exams. Ask your health care provider how often you should have your eyes checked. Personal lifestyle choices, including: Daily care of  your teeth and gums. Regular physical activity. Eating a healthy diet. Avoiding tobacco and drug use. Limiting alcohol use. Practicing safe sex. Taking low-dose aspirin every day. Taking vitamin and mineral supplements as recommended by your health care provider. What happens during an annual well check? The services and screenings done by your health care provider during your annual well check will depend on your age, overall health, lifestyle risk factors, and family history of disease. Counseling  Your health care provider may ask you questions about your: Alcohol use. Tobacco use. Drug use. Emotional well-being. Home and relationship well-being. Sexual activity. Eating habits. History of falls. Memory and ability to understand (cognition). Work and work Astronomer. Reproductive health. Screening  You may have the following tests or measurements: Height, weight, and BMI. Blood pressure. Lipid and cholesterol levels. These may be checked every 5 years, or more frequently if you are over 43 years old. Skin check. Lung cancer screening. You may have this screening every year starting at age 43 if you have a 30-pack-year history of smoking and currently smoke or have quit within the past 15 years. Fecal occult blood test (FOBT) of the stool. You may have this test every year starting at age 38. Flexible sigmoidoscopy or colonoscopy. You may have a sigmoidoscopy every 5 years or a colonoscopy every 10 years  starting at age 69. Hepatitis C blood test. Hepatitis B blood test. Sexually transmitted disease (STD) testing. Diabetes screening. This is done by checking your blood sugar (glucose) after you have not eaten for a while (fasting). You may have this done every 1-3 years. Bone density scan. This is done to screen for osteoporosis. You may have this done starting at age 66. Mammogram. This may be done every 1-2 years. Talk to your health care provider about how often you should  have regular mammograms. Talk with your health care provider about your test results, treatment options, and if necessary, the need for more tests. Vaccines  Your health care provider may recommend certain vaccines, such as: Influenza vaccine. This is recommended every year. Tetanus, diphtheria, and acellular pertussis (Tdap, Td) vaccine. You may need a Td booster every 10 years. Zoster vaccine. You may need this after age 33. Pneumococcal 13-valent conjugate (PCV13) vaccine. One dose is recommended after age 47. Pneumococcal polysaccharide (PPSV23) vaccine. One dose is recommended after age 34. Talk to your health care provider about which screenings and vaccines you need and how often you need them. This information is not intended to replace advice given to you by your health care provider. Make sure you discuss any questions you have with your health care provider. Document Released: 10/11/2015 Document Revised: 06/03/2016 Document Reviewed: 07/16/2015 Elsevier Interactive Patient Education  2017 [place] Prevention in the Home Falls can cause injuries. They can happen to people of all ages. There are many things you can do to make your home safe and to help prevent falls. What can I do on the outside of my home? Regularly fix the edges of walkways and driveways and fix any cracks. Remove anything that might make you trip as you walk through a door, such as a raised step or threshold. Trim any bushes or trees on the path to your home. Use bright outdoor lighting. Clear any walking paths of anything that might make someone trip, such as rocks or tools. Regularly check to see if handrails are loose or broken. Make sure that both sides of any steps have handrails. Any raised decks and porches should have guardrails on the edges. Have any leaves, snow, or ice cleared regularly. Use sand or salt on walking paths during winter. Clean up any spills in your garage right away. This  includes oil or grease spills. What can I do in the bathroom? Use night lights. Install grab bars by the toilet and in the tub and shower. Do not use towel bars as grab bars. Use non-skid mats or decals in the tub or shower. If you need to sit down in the shower, use a plastic, non-slip stool. Keep the floor dry. Clean up any water that spills on the floor as soon as it happens. Remove soap buildup in the tub or shower regularly. Attach bath mats securely with double-sided non-slip rug tape. Do not have throw rugs and other things on the floor that can make you trip. What can I do in the bedroom? Use night lights. Make sure that you have a light by your bed that is easy to reach. Do not use any sheets or blankets that are too big for your bed. They should not hang down onto the floor. Have a firm chair that has side arms. You can use this for support while you get dressed. Do not have throw rugs and other things on the floor that can make you trip. What  can I do in the kitchen? Clean up any spills right away. Avoid walking on wet floors. Keep items that you use a lot in easy-to-reach places. If you need to reach something above you, use a Beutler step stool that has a grab bar. Keep electrical cords out of the way. Do not use floor polish or wax that makes floors slippery. If you must use wax, use non-skid floor wax. Do not have throw rugs and other things on the floor that can make you trip. What can I do with my stairs? Do not leave any items on the stairs. Make sure that there are handrails on both sides of the stairs and use them. Fix handrails that are broken or loose. Make sure that handrails are as long as the stairways. Check any carpeting to make sure that it is firmly attached to the stairs. Fix any carpet that is loose or worn. Avoid having throw rugs at the top or bottom of the stairs. If you do have throw rugs, attach them to the floor with carpet tape. Make sure that you  have a light switch at the top of the stairs and the bottom of the stairs. If you do not have them, ask someone to add them for you. What else can I do to help prevent falls? Wear shoes that: Do not have high heels. Have rubber bottoms. Are comfortable and fit you well. Are closed at the toe. Do not wear sandals. If you use a stepladder: Make sure that it is fully opened. Do not climb a closed stepladder. Make sure that both sides of the stepladder are locked into place. Ask someone to hold it for you, if possible. Clearly mark and make sure that you can see: Any grab bars or handrails. First and last steps. Where the edge of each step is. Use tools that help you move around (mobility aids) if they are needed. These include: Canes. Walkers. Scooters. Crutches. Turn on the lights when you go into a dark area. Replace any light bulbs as soon as they burn out. Set up your furniture so you have a clear path. Avoid moving your furniture around. If any of your floors are uneven, fix them. If there are any pets around you, be aware of where they are. Review your medicines with your doctor. Some medicines can make you feel dizzy. This can increase your chance of falling. Ask your doctor what other things that you can do to help prevent falls. This information is not intended to replace advice given to you by your health care provider. Make sure you discuss any questions you have with your health care provider. Document Released: 07/11/2009 Document Revised: 02/20/2016 Document Reviewed: 10/19/2014 Elsevier Interactive Patient Education  2017 [employer].

## 2023-04-08 ENCOUNTER — Ambulatory Visit: Payer: Medicare Other | Admitting: Family Medicine

## 2023-04-08 DIAGNOSIS — R92323 Mammographic fibroglandular density, bilateral breasts: Secondary | ICD-10-CM | POA: Diagnosis not present

## 2023-04-08 DIAGNOSIS — Z1231 Encounter for screening mammogram for malignant neoplasm of breast: Secondary | ICD-10-CM | POA: Diagnosis not present

## 2023-04-22 ENCOUNTER — Encounter: Payer: Self-pay | Admitting: Family Medicine

## 2023-04-22 ENCOUNTER — Ambulatory Visit (INDEPENDENT_AMBULATORY_CARE_PROVIDER_SITE_OTHER): Payer: Medicare Other | Admitting: Family Medicine

## 2023-04-22 VITALS — BP 128/66 | HR 75 | Temp 97.7°F | Resp 16 | Ht 66.0 in | Wt 218.4 lb

## 2023-04-22 DIAGNOSIS — M21949 Unspecified acquired deformity of hand, unspecified hand: Secondary | ICD-10-CM | POA: Diagnosis not present

## 2023-04-22 DIAGNOSIS — I152 Hypertension secondary to endocrine disorders: Secondary | ICD-10-CM | POA: Diagnosis not present

## 2023-04-22 DIAGNOSIS — E1169 Type 2 diabetes mellitus with other specified complication: Secondary | ICD-10-CM

## 2023-04-22 DIAGNOSIS — E1159 Type 2 diabetes mellitus with other circulatory complications: Secondary | ICD-10-CM

## 2023-04-22 DIAGNOSIS — E785 Hyperlipidemia, unspecified: Secondary | ICD-10-CM

## 2023-04-22 LAB — POCT GLYCOSYLATED HEMOGLOBIN (HGB A1C): Hemoglobin A1C: 6.5 % — AB (ref 4.0–5.6)

## 2023-04-22 NOTE — Assessment & Plan Note (Signed)
Improved, chronic.  Continue current medication.  Doing well with lifestyle change, heart healthy diet and increased activity over the summer months.  Trulicity 0.75 mg weekly Metformin XR 500 mg 2 tablets daily  Follow-up in 6 months

## 2023-04-22 NOTE — Assessment & Plan Note (Signed)
Stable, chronic.  Continue current medication.  Well-controlled on lisinopril 20 mg daily, amlodipine 10 mg p.o. daily

## 2023-04-22 NOTE — Progress Notes (Signed)
Patient ID: Terri [name], female    DOB: 05/05/1951, 72 y.o.   MRN: [id number]  This visit was conducted in person.  BP 128/66   Pulse 75   Temp 97.7 F (36.5 C)   Resp 16   Ht 5\' 6"  (1.676 m)   Wt 218 lb 6 oz (99.1 kg)   SpO2 97%   BMI 35.25 kg/m    CC:  Chief Complaint  Patient presents with   Diabetes    Subjective:   HPI: Terri [name] is a 72 y.o. female presenting on 04/22/2023 for Diabetes   Hypertension:  Well-controlled on lisinopril 20 mg daily, amlodipine 10 mg p.o. daily BP Readings from Last 3 Encounters:  04/22/23 128/66  01/07/23 120/62  07/23/22 120/60  Using medication without problems or lightheadedness:  none Chest pain with exertion: none Edema: none Short of breath: none Average home BPs: Other issues:   Diabetes:  Controlled on Trulicity 0.75 mg weekly and metformin XR 500 mg 2 tablets daily Lab Results  Component Value Date   HGBA1C 6.5 (A) 04/22/2023  Using medications without difficulties: none Hypoglycemic episodes: none Hyperglycemic episodes: none Feet problems: none Blood Sugars averaging:  occ eye exam within last year: year    5 lb in last 3 month. Obesity Body mass index is 35.25 kg/m. Wt Readings from Last 3 Encounters:  04/22/23 218 lb 6 oz (99.1 kg)  01/21/23 223 lb (101.2 kg)  01/07/23 223 lb 4 oz (101.3 kg)         Relevant past medical, surgical, family and social history reviewed and updated as indicated. Interim medical history since our last visit reviewed. Allergies and medications reviewed and updated. Outpatient Medications Prior to Visit  Medication Sig Dispense Refill   amLODipine (NORVASC) 10 MG tablet TAKE 1 TABLET BY MOUTH DAILY 90 tablet 3   aspirin 81 MG tablet Take 81 mg by mouth daily.     atorvastatin (LIPITOR) 40 MG tablet TAKE 1 TABLET BY MOUTH DAILY 90 tablet 2   Blood Glucose Monitoring Suppl (ONETOUCH VERIO IQ SYSTEM) w/Device KIT      Calcium Carbonate (CALCIUM 600 PO) Take 1  tablet by mouth daily.     Cholecalciferol (VITAMIN D3 PO) Take 2,000 Units by mouth daily.     diclofenac (VOLTAREN) 75 MG EC tablet Take 1 tablet (75 mg total) by mouth 2 (two) times daily. 30 tablet 0   Dulaglutide (TRULICITY) 1.5 MG/0.5ML SOPN Inject 1.5 mg into the skin once a week. 6 mL 3   glucose blood (ONETOUCH VERIO) test strip USE TO CHECK BLOOD SUGAR DAILY  DX: E11.65 100 each 3   Lancets (ONETOUCH ULTRASOFT) lancets USE TO CHECK FASTING BLOOD SUGAR DAILY  DX: E11.65 100 each 3   lisinopril (ZESTRIL) 20 MG tablet TAKE 1 TABLET BY MOUTH DAILY 90 tablet 3   Lysine 500 MG TABS Take 1 tablet by mouth daily as needed.     metFORMIN (GLUCOPHAGE-XR) 500 MG 24 hr tablet TAKE 2 TABLETS BY MOUTH DAILY WITH BREAKFAST 180 tablet 3   nortriptyline (PAMELOR) 10 MG capsule Take 1 capsule (10 mg total) by mouth at bedtime. 90 capsule 0   nystatin cream (MYCOSTATIN) Apply 1 application topically 2 (two) times daily as needed for dry skin. 30 g 0   No facility-administered medications prior to visit.     Per HPI unless specifically indicated in ROS section below Review of Systems  Constitutional:  Negative for fatigue and fever.  HENT:  Negative for congestion.   Eyes:  Negative for pain.  Respiratory:  Negative for cough and shortness of breath.   Cardiovascular:  Negative for chest pain, palpitations and leg swelling.  Gastrointestinal:  Negative for abdominal pain.  Genitourinary:  Negative for dysuria and vaginal bleeding.  Musculoskeletal:  Negative for back pain.  Neurological:  Negative for syncope, light-headedness and headaches.  Psychiatric/Behavioral:  Negative for dysphoric mood.    Objective:  BP 128/66   Pulse 75   Temp 97.7 F (36.5 C)   Resp 16   Ht 5\' 6"  (1.676 m)   Wt 218 lb 6 oz (99.1 kg)   SpO2 97%   BMI 35.25 kg/m   Wt Readings from Last 3 Encounters:  04/22/23 218 lb 6 oz (99.1 kg)  01/21/23 223 lb (101.2 kg)  01/07/23 223 lb 4 oz (101.3 kg)           Results for orders placed or performed in visit on 04/22/23  POCT glycosylated hemoglobin (Hb A1C)  Result Value Ref Range   Hemoglobin A1C 6.5 (A) 4.0 - 5.6 %   HbA1c POC (<> result, manual entry)     HbA1c, POC (prediabetic range)     HbA1c, POC (controlled diabetic range)       COVID 19 screen:  No recent travel or known exposure to COVID19 The patient denies respiratory symptoms of COVID 19 at this time. The importance of social distancing was discussed today.   Assessment and Plan  Problem List Items Addressed This Visit     Deformity of hand    Chronic,has upcoming appointment in September with rheumatology . Hand x-rays were most consistent with osteoarthritis but she did have positive ANA.  Rheumatoid factor and anti-CCP antibody were negative.      RESOLVED: Hyperlipidemia associated with type 2 diabetes mellitus (HCC) (Chronic)    Stable, chronic.  Continue current medication.   LDL at goal less than 70 on atorvastatin 40 mg daily      Hypertension associated with diabetes (HCC) (Chronic)    Stable, chronic.  Continue current medication.  Well-controlled on lisinopril 20 mg daily, amlodipine 10 mg p.o. daily       Type 2 diabetes mellitus with other circulatory complications HTN (HCC) - Primary (Chronic)    Improved, chronic.  Continue current medication.  Doing well with lifestyle change, heart healthy diet and increased activity over the summer months.  Trulicity 0.75 mg weekly Metformin XR 500 mg 2 tablets daily  Follow-up in 6 months        Relevant Orders   POCT glycosylated hemoglobin (Hb A1C) (Completed)    Kerby Nora, MD

## 2023-04-22 NOTE — Assessment & Plan Note (Signed)
Chronic,has upcoming appointment in September with rheumatology . Hand x-rays were most consistent with osteoarthritis but she did have positive ANA.  Rheumatoid factor and anti-CCP antibody were negative.

## 2023-04-22 NOTE — Assessment & Plan Note (Signed)
Stable, chronic.  Continue current medication.   LDL at goal less than 70 on atorvastatin 40 mg daily

## 2023-06-13 ENCOUNTER — Other Ambulatory Visit: Payer: Self-pay | Admitting: Family Medicine

## 2023-07-28 ENCOUNTER — Ambulatory Visit: Payer: Medicare Other | Attending: Internal Medicine | Admitting: Internal Medicine

## 2023-07-28 ENCOUNTER — Encounter: Payer: Self-pay | Admitting: Internal Medicine

## 2023-07-28 VITALS — BP 135/79 | HR 70 | Resp 14 | Ht 66.5 in | Wt 225.0 lb

## 2023-07-28 DIAGNOSIS — M722 Plantar fascial fibromatosis: Secondary | ICD-10-CM | POA: Insufficient documentation

## 2023-07-28 DIAGNOSIS — M720 Palmar fascial fibromatosis [Dupuytren]: Secondary | ICD-10-CM | POA: Diagnosis not present

## 2023-07-28 DIAGNOSIS — E1159 Type 2 diabetes mellitus with other circulatory complications: Secondary | ICD-10-CM

## 2023-07-28 DIAGNOSIS — M21949 Unspecified acquired deformity of hand, unspecified hand: Secondary | ICD-10-CM

## 2023-07-28 NOTE — Progress Notes (Unsigned)
Office Visit Note  Patient: Terri [name]             Date of Birth: [birth year]           MRN: [id number]             PCP: Excell Seltzer, MD Referring: Excell Seltzer, MD Visit Date: 07/28/2023 Occupation: Retired, former Conservation officer, nature  Subjective:  New Patient (Initial Visit) (Patient states her hands are deformed and patient has bumps on the insides of her hands. )   History of Present Illness: Terri [name] is a 72 y.o. female here for evaluation of progressive hand deformities with worsening nodules on both palms with decreased range of motion.  Medical history significant for breast cancer DCIS treated with radiation and hormone therapy for 5 years was completed in 2017, and type 2 diabetes.  She has some chronic arthritis symptoms with low back and hip pain has been more problematic recently and started taking oral diclofenac just as needed since October last year.  Hand problems are gradual onset she has mostly been noticing the problem since earlier this year with stiffness and inability to fully flatten her hand.  Also with increasing size of nodules on the palms.  Not associated with any particular pain, swelling, redness, or numbness.  She had x-ray of her right hand checked in April that showed degenerative arthritis throughout both proximal and distal finger joints with no erosive disease.  Lab test showed a 1: 80 positive ANA with negative test for RA or inflammatory markers. Denies any new skin rashes, finger or toe discoloration, oral nasal ulcers, lymphadenopathy, or joint swelling. She describes a family history of similar hand deformities and contractures in multiple relatives including much worse with her father and brother.  Labs reviewed 12/2022 ANA 1:80 homogenous RF neg CCP neg ESR 12 CRP <1  Activities of Daily Living:  Patient reports morning stiffness for 0 minute.   Patient Denies nocturnal pain.  Difficulty dressing/grooming: Denies Difficulty climbing  stairs: Denies Difficulty getting out of chair: Denies Difficulty using hands for taps, buttons, cutlery, and/or writing: Reports  Review of Systems  Constitutional:  Positive for fatigue.  HENT:  Negative for mouth sores and mouth dryness.   Eyes:  Negative for dryness.  Respiratory:  Negative for shortness of breath.   Cardiovascular:  Negative for chest pain and palpitations.  Gastrointestinal:  Negative for blood in stool, constipation and diarrhea.  Endocrine: Negative for increased urination.  Genitourinary:  Negative for involuntary urination.  Musculoskeletal:  Positive for joint pain and joint pain. Negative for gait problem, joint swelling, myalgias, muscle weakness, morning stiffness, muscle tenderness and myalgias.  Skin:  Negative for color change, rash, hair loss and sensitivity to sunlight.  Allergic/Immunologic: Negative for susceptible to infections.  Neurological:  Negative for dizziness and headaches.  Hematological:  Negative for swollen glands.  Psychiatric/Behavioral:  Positive for sleep disturbance. Negative for depressed mood. The patient is not nervous/anxious.     PMFS History:  Patient Active Problem List   Diagnosis Date Noted   Plantar fascial fibromatosis of both feet 07/28/2023   Palmar fascial fibromatosis 07/28/2023   Deformity of hand 01/07/2023   Chronic venous insufficiency 11/19/2020   Candida infection 09/09/2017   Class 2 severe obesity with serious comorbidity and body mass index (BMI) of 37.0 to 37.9 in adult [facility]) 12/03/2016   Neoplasm of right breast, primary tumor staging category Tis: ductal carcinoma in situ (DCIS) 01/13/2016   Advanced  care planning/counseling discussion 11/22/2015   Episodic cluster headache, not intractable 12/19/2014   Type 2 diabetes mellitus with other circulatory complications HTN (HCC) 12/07/2007   Hypertension associated with diabetes (HCC) 12/07/2007   Atherosclerosis of autologous artery coronary artery  bypass graft(s) with unspecified angina pectoris (HCC) 12/07/2007   GERD 12/07/2007    Past Medical History:  Diagnosis Date   Abdominal mass    Abnormal coagulation profile    Arthritis    CAD (coronary artery disease)    Diabetes mellitus type II, uncontrolled    GERD (gastroesophageal reflux disease)    Hyperlipidemia    Hypertension    Mesenteric mass 07/2008   complicated by nausea & vomiting   Other postoperative infection    S/P PICC central line placement 10/2008   line sepsis-right PICC line removed     Family History  Problem Relation Age of Onset   Coronary artery disease Mother    Hypertension Mother    Arthritis Mother    Brain cancer Father 41       deceased   Arthritis Father    Diabetes Sister    Diabetes Brother         X 2   Coronary artery disease Brother    Prostate cancer Brother    Diabetes Brother    Colon cancer Neg Hx    Stomach cancer Neg Hx    Past Surgical History:  Procedure Laterality Date   ABDOMINAL MASS RESECTION  07/2008   Mesentertic Mass resection   BREAST SURGERY Right 01/27/2016   [facility]   TOTAL ABDOMINAL HYSTERECTOMY  1993   Social History   Social History Narrative   Occupation:  retired      Married - two grown children      Never Smoked      Alcohol use-no            Immunization History  Administered Date(s) Administered   Fluad Quad(high Dose 65+) 07/19/2020, 07/24/2021, 07/23/2022   Influenza Whole 07/19/2008   Influenza, High Dose Seasonal PF 10/28/2016   Influenza,inj,Quad PF,6+ Mos 09/09/2017, 07/26/2018, 05/26/2019   Influenza-Unspecified 11/01/2016   PFIZER(Purple Top)SARS-COV-2 Vaccination 10/10/2019, 11/04/2019, 05/28/2020   Pneumococcal Conjugate-13 11/26/2014   Pneumococcal Polysaccharide-23 07/19/2008, 12/03/2016   Td 11/26/2014     Objective: Vital Signs: BP 135/79 (BP Location: Right Arm, Patient Position: Sitting, Cuff Size: Normal)   Pulse 70   Resp 14   Ht 5' 6.5" (1.689 m)   Wt 225 lb  (102.1 kg)   BMI 35.77 kg/m    Physical Exam Constitutional:      Appearance: She is obese.  Eyes:     Conjunctiva/sclera: Conjunctivae normal.  Cardiovascular:     Rate and Rhythm: Normal rate and regular rhythm.  Pulmonary:     Effort: Pulmonary effort is normal.     Breath sounds: Normal breath sounds.  Musculoskeletal:     Right lower leg: No edema.     Left lower leg: No edema.  Lymphadenopathy:     Cervical: No cervical adenopathy.  Skin:    General: Skin is warm and dry.     Findings: No rash.  Neurological:     Mental Status: She is alert.  Psychiatric:        Mood and Affect: Mood normal.      Musculoskeletal Exam:  Neck full ROM no tenderness Shoulders full ROM no tenderness or swelling Elbows full ROM no tenderness or swelling Wrists full ROM no tenderness or swelling  Fingers no palpable swelling, chronic MCP joint thickening, decreased extension ROM slight except more advanced in 5th fingers, palmar contractures proximal to 3rd-5th MCPs worst at 5th digit Knees full ROM no tenderness or swelling Ankles full ROM no tenderness or swelling Firm 1-2cm nodules on plantar surface midfoot and distally on medial half of foot MTPs full ROM no tenderness or swelling   Investigation: No additional findings.  Imaging: No results found.  Recent Labs: Lab Results  Component Value Date   WBC 8.8 03/12/2020   HGB 13.3 03/12/2020   PLT 286.0 03/12/2020   NA 143 01/05/2023   K 3.9 01/05/2023   CL 103 01/05/2023   CO2 32 01/05/2023   GLUCOSE 111 (H) 01/05/2023   BUN 10 01/05/2023   CREATININE 0.69 01/05/2023   BILITOT 0.6 01/05/2023   ALKPHOS 108 01/05/2023   AST 18 01/05/2023   ALT 21 01/05/2023   PROT 6.5 01/05/2023   ALBUMIN 4.0 01/05/2023   CALCIUM 9.3 01/05/2023   GFRAA 162 10/16/2008    Speciality Comments: No specialty comments available.  Procedures:  No procedures performed Allergies: Metoclopramide and Morphine   Assessment / Plan:      Visit Diagnoses: Palmar fascial fibromatosis Deformity of hand, unspecified laterality  Does not see any concerning signs of synovitis or inflammatory joint changes.  Does appear to have mild osteoarthritis but primarily due to Dupuytren's contracture progressive in both hands.  No occupational history does have diabetes and Goodell family history of similar deformity.  I discussed possible treatment options including occupational therapy evaluation, intralesional collagenase injections, or surgical release.  She does not seem interested in pursuing surgery at this time.  Although I do recommend her to see a hand specialist in consultation to explore all of her options in detail even if feeling a more conservative route initially.  Will refer to [facility] hand surgery locally if they do pursue therapy should be a good candidate to follow-up with someone closer to her in [place].  Plantar fascial fibromatosis of both feet  Provided printed plantar fasciitis range of motion exercises.  If not currently very symptomatic.  Can see podiatry for possible intra nodular steroid injection or surgical release if developing increased symptoms.  Type 2 diabetes mellitus with other circulatory complications HTN (HCC)  Association with fibromatosis but given the Steig family history I suspect that is much more related.  Orders: Orders Placed This Encounter  Procedures   Ambulatory referral to Hand Surgery   No orders of the defined types were placed in this encounter.   Follow-Up Instructions: No follow-ups on file.   Fuller Plan, MD  Note - This record has been created using [employer].  Chart creation errors have been sought, but may not always  have been located. Such creation errors do not reflect on  the standard of medical care.

## 2023-08-20 DIAGNOSIS — M720 Palmar fascial fibromatosis [Dupuytren]: Secondary | ICD-10-CM | POA: Diagnosis not present

## 2023-09-11 ENCOUNTER — Other Ambulatory Visit: Payer: Self-pay | Admitting: Family Medicine

## 2023-09-14 DIAGNOSIS — M720 Palmar fascial fibromatosis [Dupuytren]: Secondary | ICD-10-CM | POA: Diagnosis not present

## 2023-09-21 ENCOUNTER — Encounter: Payer: Self-pay | Admitting: Pharmacist

## 2023-09-21 NOTE — Progress Notes (Signed)
Pharmacy Quality Measure Review  This patient is appearing on a report for being at risk of failing the adherence measure for cholesterol (statin) medications this calendar year.   Medication: atorvastatin 40 mg Last fill date: 05/12/23 for 90 day supply  Insurance report was not up to date. No action needed at this time.  Medication refill was sent to pharmacy 09/12/16. Confirmation of patient pick up 09/14/23 for 90 ds.

## 2023-09-24 ENCOUNTER — Other Ambulatory Visit: Payer: Self-pay | Admitting: Family Medicine

## 2023-09-24 NOTE — Telephone Encounter (Signed)
Last office visit 04/22/2023 for DM.  Last refilled 01/07/2023 for #30 with no refills.  Next Appt: CPE 10/26/2023.

## 2023-10-05 ENCOUNTER — Telehealth: Payer: Self-pay | Admitting: *Deleted

## 2023-10-05 DIAGNOSIS — E1169 Type 2 diabetes mellitus with other specified complication: Secondary | ICD-10-CM

## 2023-10-05 DIAGNOSIS — E785 Hyperlipidemia, unspecified: Secondary | ICD-10-CM

## 2023-10-05 DIAGNOSIS — E1159 Type 2 diabetes mellitus with other circulatory complications: Secondary | ICD-10-CM

## 2023-10-05 NOTE — Telephone Encounter (Signed)
-----   Message from Lovena Neighbours sent at 10/05/2023  2:14 PM EST ----- Regarding: Labs for Tuesday 1.21.25 Please put physical lab orders in future. Thank you, Denny Peon

## 2023-10-08 ENCOUNTER — Ambulatory Visit (INDEPENDENT_AMBULATORY_CARE_PROVIDER_SITE_OTHER): Payer: Medicare Other | Admitting: Family Medicine

## 2023-10-08 VITALS — BP 130/70 | HR 86 | Temp 98.1°F | Ht 66.0 in | Wt 216.4 lb

## 2023-10-08 DIAGNOSIS — L089 Local infection of the skin and subcutaneous tissue, unspecified: Secondary | ICD-10-CM

## 2023-10-08 DIAGNOSIS — R051 Acute cough: Secondary | ICD-10-CM | POA: Diagnosis not present

## 2023-10-08 DIAGNOSIS — L723 Sebaceous cyst: Secondary | ICD-10-CM | POA: Diagnosis not present

## 2023-10-08 LAB — POC INFLUENZA A&B (BINAX/QUICKVUE)
Influenza A, POC: NEGATIVE
Influenza B, POC: NEGATIVE

## 2023-10-08 LAB — POC COVID19 BINAXNOW: SARS Coronavirus 2 Ag: NEGATIVE

## 2023-10-08 MED ORDER — DOXYCYCLINE HYCLATE 100 MG PO TABS: 100.0000 mg | ORAL_TABLET | Freq: Two times a day (BID) | ORAL | 0 refills | Status: DC

## 2023-10-08 NOTE — Progress Notes (Signed)
 Patient ID: Terri [name], female    DOB: 1951-03-29, 73 y.o.   MRN: [id number]  This visit was conducted in person.  BP 130/70 (BP Location: Left Arm, Patient Position: Sitting, Cuff Size: Large)   Pulse 86   Temp 98.1 F (36.7 C) (Temporal)   Ht 5' 6 (1.676 m)   Wt 216 lb 6 oz (98.1 kg)   SpO2 98%   BMI 34.92 kg/m    CC:  Chief Complaint  Patient presents with   Cough    Deep-White Phlegm x Started on Tuesday   Recurrent Skin Infections    Boil on Back   Nasal Congestion   Headache    Subjective:   HPI: Terri [name] is a 73 y.o. female presenting on 10/08/2023 for Cough (Deep-White Phlegm x Started on Tuesday), Recurrent Skin Infections (Boil on Back), Nasal Congestion, and Headache   Date of onset:  3 days Initial symptoms included  nasal congestion, runny nose Symptoms progressed to productive  cough.  No ear pain, no face pain  Some headache  No fever  Mild SOB/wheeze   Cough keeping her up at night    Sick contacts:  none COVID testing:   none     She has tried to treat with  mucinex, advil, coricidin     No history of chronic lung disease such as asthma or COPD. Non-smoker.   In last 2 week.. feeling pain and tightness , irritation, itching. Upper back.  No discharge.  Doing shower soaks.      Relevant past medical, surgical, family and social history reviewed and updated as indicated. Interim medical history since our last visit reviewed. Allergies and medications reviewed and updated. Outpatient Medications Prior to Visit  Medication Sig Dispense Refill   amLODipine  (NORVASC ) 10 MG tablet TAKE 1 TABLET BY MOUTH DAILY 90 tablet 3   aspirin 81 MG tablet Take 81 mg by mouth daily.     atorvastatin  (LIPITOR) 40 MG tablet TAKE 1 TABLET BY MOUTH DAILY 90 tablet 2   Blood Glucose Monitoring Suppl (ONETOUCH VERIO IQ SYSTEM) w/Device KIT      Calcium  Carbonate (CALCIUM  600 PO) Take 1 tablet by mouth daily.     Cholecalciferol (VITAMIN D3 PO)  Take 2,000 Units by mouth daily.     diclofenac  (VOLTAREN ) 75 MG EC tablet TAKE 1 TABLET BY MOUTH TWICE A DAY 30 tablet 0   Dulaglutide  (TRULICITY ) 1.5 MG/0.5ML SOPN Inject 1.5 mg into the skin once a week. 6 mL 3   glucose blood (ONETOUCH VERIO) test strip USE TO CHECK BLOOD SUGAR DAILY  DX: E11.65 100 each 3   Lancets (ONETOUCH ULTRASOFT) lancets USE TO CHECK FASTING BLOOD SUGAR DAILY  DX: E11.65 100 each 3   lisinopril  (ZESTRIL ) 20 MG tablet TAKE ONE TABLET BY MOUTH DAILY 90 tablet 3   Lysine  500 MG TABS Take 1 tablet by mouth daily as needed.     nortriptyline  (PAMELOR ) 10 MG capsule Take 1 capsule (10 mg total) by mouth at bedtime. 90 capsule 0   nystatin  cream (MYCOSTATIN ) Apply 1 application topically 2 (two) times daily as needed for dry skin. 30 g 0   metFORMIN  (GLUCOPHAGE -XR) 500 MG 24 hr tablet TAKE 2 TABLETS BY MOUTH DAILY WITH BREAKFAST 180 tablet 3   TRULICITY  0.75 MG/0.5ML SOAJ Inject 0.75 mg into the skin once a week.     No facility-administered medications prior to visit.     Per HPI unless specifically indicated  in ROS section below Review of Systems  Constitutional:  Negative for fatigue and fever.  HENT:  Negative for congestion.   Eyes:  Negative for pain.  Respiratory:  Negative for cough and shortness of breath.   Cardiovascular:  Negative for chest pain, palpitations and leg swelling.  Gastrointestinal:  Negative for abdominal pain.  Genitourinary:  Negative for dysuria and vaginal bleeding.  Musculoskeletal:  Negative for back pain.  Neurological:  Negative for syncope, light-headedness and headaches.  Psychiatric/Behavioral:  Negative for dysphoric mood.    Objective:  BP 130/70 (BP Location: Left Arm, Patient Position: Sitting, Cuff Size: Large)   Pulse 86   Temp 98.1 F (36.7 C) (Temporal)   Ht 5' 6 (1.676 m)   Wt 216 lb 6 oz (98.1 kg)   SpO2 98%   BMI 34.92 kg/m   Wt Readings from Last 3 Encounters:  10/08/23 216 lb 6 oz (98.1 kg)  07/28/23 225  lb (102.1 kg)  04/22/23 218 lb 6 oz (99.1 kg)      Physical Exam Constitutional:      General: She is not in acute distress.    Appearance: Normal appearance. She is well-developed. She is not ill-appearing or toxic-appearing.  HENT:     Head: Normocephalic.     Right Ear: Hearing, tympanic membrane, ear canal and external ear normal. Tympanic membrane is not erythematous, retracted or bulging.     Left Ear: Hearing, tympanic membrane, ear canal and external ear normal. Tympanic membrane is not erythematous, retracted or bulging.     Nose: No mucosal edema or rhinorrhea.     Right Sinus: No maxillary sinus tenderness or frontal sinus tenderness.     Left Sinus: No maxillary sinus tenderness or frontal sinus tenderness.     Mouth/Throat:     Pharynx: Uvula midline.  Eyes:     General: Lids are normal. Lids are everted, no foreign bodies appreciated.     Conjunctiva/sclera: Conjunctivae normal.     Pupils: Pupils are equal, round, and reactive to light.  Neck:     Thyroid : No thyroid  mass or thyromegaly.     Vascular: No carotid bruit.     Trachea: Trachea normal.  Cardiovascular:     Rate and Rhythm: Normal rate and regular rhythm.     Pulses: Normal pulses.     Heart sounds: Normal heart sounds, S1 normal and S2 normal. No murmur heard.    No friction rub. No gallop.  Pulmonary:     Effort: Pulmonary effort is normal. No tachypnea or respiratory distress.     Breath sounds: Normal breath sounds. No decreased breath sounds, wheezing, rhonchi or rales.  Abdominal:     General: Bowel sounds are normal.     Palpations: Abdomen is soft.     Tenderness: There is no abdominal tenderness.  Musculoskeletal:     Cervical back: Normal range of motion and neck supple.  Skin:    General: Skin is warm and dry.     Findings: No rash.     Comments:  See photo  Neurological:     Mental Status: She is alert.  Psychiatric:        Mood and Affect: Mood is not anxious or depressed.         Speech: Speech normal.        Behavior: Behavior normal. Behavior is cooperative.        Thought Content: Thought content normal.        Judgment: Judgment  normal.       Results for orders placed or performed in visit on 10/08/23  POC COVID-19   Collection Time: 10/08/23  2:43 PM  Result Value Ref Range   SARS Coronavirus 2 Ag Negative Negative  POC Influenza A&B (Binax test)   Collection Time: 10/08/23  2:43 PM  Result Value Ref Range   Influenza A, POC Negative Negative   Influenza B, POC Negative Negative    Assessment and Plan  Acute cough Assessment & Plan: Acute, most likely viral respiratory tract infection.  Negative COVID and flu testing.  Recommended supportive and symptomatic care.  Of note we will be placing her on antibiotics for the infected sebaceous cyst, doxycycline  will cover for atypical pneumonia as well.   Return and ER precautions provided  Orders: -     POC COVID-19 BinaxNow -     POC Influenza A&B(BINAX/QUICKVUE)  Infected sebaceous cyst of skin Assessment & Plan: Acute infection at the site of sebaceous cyst on right upper back. Encouraged patient to start with warm compresses 3 times daily.  Will treat with doxycycline  100 mg twice daily x 10 days.  If redness and pain not improving in the next 3 to 4 days she will return for incision and drainage.  Discussed in detail that incision and drainage would not remove the cyst.  If the cyst continues to become recurrently infected or bothersome to the patient she can consider removal.  Discussed the possibility of recurrence. When the cyst is not infected she can consider seeing dermatologist for attempted removal of the entire sebaceous cyst.  She will let me know if she wants to move forward with referral to dermatology for appointment in the next several months.   Other orders -     Doxycycline  Hyclate; Take 1 tablet (100 mg total) by mouth 2 (two) times daily.  Dispense: 20 tablet; Refill: 0    No  follow-ups on file.   Greig Ring, MD

## 2023-10-12 ENCOUNTER — Encounter: Payer: Self-pay | Admitting: Family Medicine

## 2023-10-12 ENCOUNTER — Other Ambulatory Visit: Payer: Self-pay | Admitting: Family Medicine

## 2023-10-13 DIAGNOSIS — R051 Acute cough: Secondary | ICD-10-CM | POA: Insufficient documentation

## 2023-10-13 NOTE — Progress Notes (Addendum)
 Spoke with Terri [name].  She states the redness is still there.  The pain is not as severe, but she is taking Tylenol for pain.  Appointment scheduled for 10/14/2022 at 10:20 am for a 40 minute appointment for I&D.

## 2023-10-13 NOTE — Assessment & Plan Note (Signed)
 Acute, most likely viral respiratory tract infection.  Negative COVID and flu testing.  Recommended supportive and symptomatic care.  Of note we will be placing her on antibiotics for the infected sebaceous cyst, doxycycline  will cover for atypical pneumonia as well.   Return and ER precautions provided

## 2023-10-13 NOTE — Assessment & Plan Note (Signed)
 Acute infection at the site of sebaceous cyst on right upper back. Encouraged patient to start with warm compresses 3 times daily.  Will treat with doxycycline  100 mg twice daily x 10 days.  If redness and pain not improving in the next 3 to 4 days she will return for incision and drainage.  Discussed in detail that incision and drainage would not remove the cyst.  If the cyst continues to become recurrently infected or bothersome to the patient she can consider removal.  Discussed the possibility of recurrence. When the cyst is not infected she can consider seeing dermatologist for attempted removal of the entire sebaceous cyst.  She will let me know if she wants to move forward with referral to dermatology for appointment in the next several months.

## 2023-10-15 ENCOUNTER — Encounter: Payer: Self-pay | Admitting: Family Medicine

## 2023-10-15 ENCOUNTER — Ambulatory Visit (INDEPENDENT_AMBULATORY_CARE_PROVIDER_SITE_OTHER): Payer: Medicare Other | Admitting: Family Medicine

## 2023-10-15 VITALS — BP 124/60 | HR 101 | Temp 97.8°F | Ht 66.0 in | Wt 218.2 lb

## 2023-10-15 DIAGNOSIS — E1159 Type 2 diabetes mellitus with other circulatory complications: Secondary | ICD-10-CM

## 2023-10-15 DIAGNOSIS — E1169 Type 2 diabetes mellitus with other specified complication: Secondary | ICD-10-CM | POA: Diagnosis not present

## 2023-10-15 DIAGNOSIS — L723 Sebaceous cyst: Secondary | ICD-10-CM

## 2023-10-15 DIAGNOSIS — E785 Hyperlipidemia, unspecified: Secondary | ICD-10-CM | POA: Diagnosis not present

## 2023-10-15 DIAGNOSIS — L089 Local infection of the skin and subcutaneous tissue, unspecified: Secondary | ICD-10-CM | POA: Diagnosis not present

## 2023-10-15 DIAGNOSIS — Z7984 Long term (current) use of oral hypoglycemic drugs: Secondary | ICD-10-CM

## 2023-10-15 DIAGNOSIS — R0981 Nasal congestion: Secondary | ICD-10-CM

## 2023-10-15 LAB — LIPID PANEL
Cholesterol: 118 mg/dL (ref 0–200)
HDL: 43.1 mg/dL (ref >39.00)
LDL Cholesterol: 56 mg/dL (ref 0–99)
NonHDL: 75.11
Total CHOL/HDL Ratio: 3
Triglycerides: 98 mg/dL (ref 0.0–149.0)
VLDL: 19.6 mg/dL (ref 0.0–40.0)

## 2023-10-15 LAB — MICROALBUMIN / CREATININE URINE RATIO
Creatinine,U: 102.7 mg/dL
Microalb Creat Ratio: 5.1 mg/g (ref 0.0–30.0)
Microalb, Ur: 5.3 mg/dL — ABNORMAL HIGH (ref 0.0–1.9)

## 2023-10-15 LAB — COMPREHENSIVE METABOLIC PANEL
ALT: 16 U/L (ref 0–35)
AST: 17 U/L (ref 0–37)
Albumin: 4.4 g/dL (ref 3.5–5.2)
Alkaline Phosphatase: 100 U/L (ref 39–117)
BUN: 9 mg/dL (ref 6–23)
CO2: 29 meq/L (ref 19–32)
Calcium: 9.4 mg/dL (ref 8.4–10.5)
Chloride: 106 meq/L (ref 96–112)
Creatinine, Ser: 0.7 mg/dL (ref 0.40–1.20)
GFR: 85.35 mL/min (ref >60.00)
Glucose, Bld: 89 mg/dL (ref 70–99)
Potassium: 3.4 meq/L — ABNORMAL LOW (ref 3.5–5.1)
Sodium: 144 meq/L (ref 135–145)
Total Bilirubin: 0.6 mg/dL (ref 0.2–1.2)
Total Protein: 7.3 g/dL (ref 6.0–8.3)

## 2023-10-15 LAB — HEMOGLOBIN A1C: Hgb A1c MFr Bld: 7.1 % — ABNORMAL HIGH (ref 4.6–6.5)

## 2023-10-15 NOTE — Patient Instructions (Addendum)
Complete antibiotics.  Start nasal saline spray and flonase for sinus congestion. Remove packing in 24-48h, keep area clean and bandaged, follow up if concerns/spreading erythema/pain.  Continue to wash with warm soapy water, irrigate. Use antibacterial soap on body as well and clean  bathroom personal items with bleach or very hot water Go to ER if severe pain.

## 2023-10-15 NOTE — Progress Notes (Signed)
Patient ID: Terri [name], female    DOB: 01-17-1951, 73 y.o.   MRN: [id number]  This visit was conducted in person.  BP 124/60 (BP Location: Left Arm, Patient Position: Sitting, Cuff Size: Large)   Pulse (!) 101   Temp 97.8 F (36.6 C) (Temporal)   Ht 5\' 6"  (1.676 m)   Wt 218 lb 4 oz (99 kg)   SpO2 97%   BMI 35.23 kg/m    CC:  Chief Complaint  Patient presents with   Abscess    Recheck Abscess on Back for possible I&D    Subjective:   HPI: Terri [name] is a 73 y.o. female presenting on 10/15/2023 for Abscess (Recheck Abscess on Back for possible I&D)   Reviewed OV note from 1/15  infected sebaceous cyst.  Now on day  7/10 of doxycycline and warm compresses.   She reports  less tenderness, still itchy. 25 % improvement.  No fever.     Dealing also with cough and congestion on 10 days of illness.  No SOB, no wheeze.  Feeling facial  pressure and fullness.       Relevant past medical, surgical, family and social history reviewed and updated as indicated. Interim medical history since our last visit reviewed. Allergies and medications reviewed and updated. Outpatient Medications Prior to Visit  Medication Sig Dispense Refill   amLODipine (NORVASC) 10 MG tablet TAKE 1 TABLET BY MOUTH DAILY 90 tablet 3   aspirin 81 MG tablet Take 81 mg by mouth daily.     atorvastatin (LIPITOR) 40 MG tablet TAKE 1 TABLET BY MOUTH DAILY 90 tablet 2   Blood Glucose Monitoring Suppl (ONETOUCH VERIO IQ SYSTEM) w/Device KIT      Calcium Carbonate (CALCIUM 600 PO) Take 1 tablet by mouth daily.     Cholecalciferol (VITAMIN D3 PO) Take 2,000 Units by mouth daily.     diclofenac (VOLTAREN) 75 MG EC tablet TAKE 1 TABLET BY MOUTH TWICE A DAY 30 tablet 0   doxycycline (VIBRA-TABS) 100 MG tablet Take 1 tablet (100 mg total) by mouth 2 (two) times daily. 20 tablet 0   Dulaglutide (TRULICITY) 1.5 MG/0.5ML SOPN Inject 1.5 mg into the skin once a week. 6 mL 3   glucose blood (ONETOUCH VERIO)  test strip USE TO CHECK BLOOD SUGAR DAILY  DX: E11.65 100 each 3   Lancets (ONETOUCH ULTRASOFT) lancets USE TO CHECK FASTING BLOOD SUGAR DAILY  DX: E11.65 100 each 3   lisinopril (ZESTRIL) 20 MG tablet TAKE ONE TABLET BY MOUTH DAILY 90 tablet 3   Lysine 500 MG TABS Take 1 tablet by mouth daily as needed.     metFORMIN (GLUCOPHAGE-XR) 500 MG 24 hr tablet TAKE 2 TABLETS BY MOUTH DAILY WITH BREAKFAST 180 tablet 0   nortriptyline (PAMELOR) 10 MG capsule Take 1 capsule (10 mg total) by mouth at bedtime. 90 capsule 0   nystatin cream (MYCOSTATIN) Apply 1 application topically 2 (two) times daily as needed for dry skin. 30 g 0   No facility-administered medications prior to visit.     Per HPI unless specifically indicated in ROS section below Review of Systems  HENT:  Positive for congestion and sinus pressure. Negative for sinus pain, sore throat and tinnitus.    Objective:  BP 124/60 (BP Location: Left Arm, Patient Position: Sitting, Cuff Size: Large)   Pulse (!) 101   Temp 97.8 F (36.6 C) (Temporal)   Ht 5\' 6"  (1.676 m)   Wt 218  lb 4 oz (99 kg)   SpO2 97%   BMI 35.23 kg/m   Wt Readings from Last 3 Encounters:  10/15/23 218 lb 4 oz (99 kg)  10/08/23 216 lb 6 oz (98.1 kg)  07/28/23 225 lb (102.1 kg)      Physical Exam Constitutional:      General: She is not in acute distress.    Appearance: Normal appearance. She is well-developed. She is not ill-appearing or toxic-appearing.  HENT:     Head: Normocephalic.     Right Ear: Hearing, tympanic membrane, ear canal and external ear normal. Tympanic membrane is not erythematous, retracted or bulging.     Left Ear: Hearing, tympanic membrane, ear canal and external ear normal. Tympanic membrane is not erythematous, retracted or bulging.     Nose: No mucosal edema or rhinorrhea.     Right Sinus: No maxillary sinus tenderness or frontal sinus tenderness.     Left Sinus: No maxillary sinus tenderness or frontal sinus tenderness.      Mouth/Throat:     Pharynx: Uvula midline.  Eyes:     General: Lids are normal. Lids are everted, no foreign bodies appreciated.     Conjunctiva/sclera: Conjunctivae normal.     Pupils: Pupils are equal, round, and reactive to light.  Neck:     Thyroid: No thyroid mass or thyromegaly.     Vascular: No carotid bruit.     Trachea: Trachea normal.  Cardiovascular:     Rate and Rhythm: Normal rate and regular rhythm.     Pulses: Normal pulses.     Heart sounds: Normal heart sounds, S1 normal and S2 normal. No murmur heard.    No friction rub. No gallop.  Pulmonary:     Effort: Pulmonary effort is normal. No tachypnea or respiratory distress.     Breath sounds: Normal breath sounds. No decreased breath sounds, wheezing, rhonchi or rales.  Abdominal:     General: Bowel sounds are normal.     Palpations: Abdomen is soft.     Tenderness: There is no abdominal tenderness.  Musculoskeletal:     Cervical back: Normal range of motion and neck supple.  Skin:    General: Skin is warm and dry.     Findings: No rash.     Comments:  Interval decrease in size of lesion and less ttp, mild erythema.  Neurological:     Mental Status: She is alert.  Psychiatric:        Mood and Affect: Mood is not anxious or depressed.        Speech: Speech normal.        Behavior: Behavior normal. Behavior is cooperative.        Thought Content: Thought content normal.        Judgment: Judgment normal.       Results for orders placed or performed in visit on 10/08/23  POC COVID-19   Collection Time: 10/08/23  2:43 PM  Result Value Ref Range   SARS Coronavirus 2 Ag Negative Negative  POC Influenza A&B (Binax test)   Collection Time: 10/08/23  2:43 PM  Result Value Ref Range   Influenza A, POC Negative Negative   Influenza B, POC Negative Negative    Assessment and Plan  Infected sebaceous cyst of skin Assessment & Plan: I&D  Meds, vitals, and allergies reviewed.   Indication: suspect abscess  Pt  complaints of: erythema, pain, swelling  only 25% improved with antibiotics doxy day 7 and warm compressess  Location: back   Size: 5 cm diameter  Informed consent obtained.  Pt aware of risks not limited to but including infection, bleeding, damage to near by organs.  Prep: etoh/betadine  Anesthesia: 1%lidocaine with epi, good effect  Incision made with #11 blade  Wound explored and loculations removed  Wound packed with iodoform gauze  Tolerated well  Routine postprocedure instructions d/w pt- remove packing in 24-48h, keep area clean and bandaged, follow up if concerns/spreading erythema/pain.    Nasal congestion Assessment & Plan:  Acute, improving. No sign of bacterial infection  Start nasal saline spray and flonase for sinus congestion.     No follow-ups on file.   Kerby Nora, MD

## 2023-10-15 NOTE — Addendum Note (Signed)
Addended by: Damita Lack on: 10/15/2023 12:05 PM   Modules accepted: Orders

## 2023-10-15 NOTE — Assessment & Plan Note (Signed)
Acute, improving. No sign of bacterial infection  Start nasal saline spray and flonase for sinus congestion.

## 2023-10-15 NOTE — Progress Notes (Signed)
No critical labs need to be addressed urgently. We will discuss labs in detail at upcoming office visit.   

## 2023-10-15 NOTE — Assessment & Plan Note (Signed)
I&D  Meds, vitals, and allergies reviewed.   Indication: suspect abscess  Pt complaints of: erythema, pain, swelling  only 25% improved with antibiotics doxy day 7 and warm compressess  Location: back   Size: 5 cm diameter  Informed consent obtained.  Pt aware of risks not limited to but including infection, bleeding, damage to near by organs.  Prep: etoh/betadine  Anesthesia: 1%lidocaine with epi, good effect  Incision made with #11 blade  Wound explored and loculations removed  Wound packed with iodoform gauze  Tolerated well  Routine postprocedure instructions d/w pt- remove packing in 24-48h, keep area clean and bandaged, follow up if concerns/spreading erythema/pain.

## 2023-10-19 ENCOUNTER — Ambulatory Visit: Payer: Medicare Other | Admitting: Family Medicine

## 2023-10-19 ENCOUNTER — Other Ambulatory Visit: Payer: Medicare Other

## 2023-10-19 LAB — WOUND CULTURE
MICRO NUMBER:: 15970385
RESULT:: NO GROWTH
SPECIMEN QUALITY:: ADEQUATE

## 2023-10-26 ENCOUNTER — Ambulatory Visit (INDEPENDENT_AMBULATORY_CARE_PROVIDER_SITE_OTHER): Payer: Medicare Other | Admitting: Family Medicine

## 2023-10-26 VITALS — BP 120/60 | HR 81 | Temp 97.8°F | Ht 66.0 in | Wt 219.1 lb

## 2023-10-26 DIAGNOSIS — E1159 Type 2 diabetes mellitus with other circulatory complications: Secondary | ICD-10-CM | POA: Diagnosis not present

## 2023-10-26 DIAGNOSIS — Z7985 Long-term (current) use of injectable non-insulin antidiabetic drugs: Secondary | ICD-10-CM

## 2023-10-26 DIAGNOSIS — Z6837 Body mass index (BMI) 37.0-37.9, adult: Secondary | ICD-10-CM

## 2023-10-26 DIAGNOSIS — I152 Hypertension secondary to endocrine disorders: Secondary | ICD-10-CM

## 2023-10-26 DIAGNOSIS — E2839 Other primary ovarian failure: Secondary | ICD-10-CM

## 2023-10-26 DIAGNOSIS — E66812 Obesity, class 2: Secondary | ICD-10-CM

## 2023-10-26 DIAGNOSIS — Z0000 Encounter for general adult medical examination without abnormal findings: Secondary | ICD-10-CM | POA: Diagnosis not present

## 2023-10-26 DIAGNOSIS — Z23 Encounter for immunization: Secondary | ICD-10-CM | POA: Diagnosis not present

## 2023-10-26 DIAGNOSIS — I25729 Atherosclerosis of autologous artery coronary artery bypass graft(s) with unspecified angina pectoris: Secondary | ICD-10-CM | POA: Diagnosis not present

## 2023-10-26 LAB — HM DIABETES FOOT EXAM

## 2023-10-26 NOTE — Patient Instructions (Addendum)
Call to set up mammogram and bone density in 03/2024

## 2023-10-26 NOTE — Assessment & Plan Note (Signed)
Stable, chronic.  Continue current medication.  Well-controlled on lisinopril 20 mg daily, amlodipine 10 mg p.o. daily

## 2023-10-26 NOTE — Progress Notes (Signed)
Patient ID: Terri [name], female    DOB: 11-09-1950, 73 y.o.   MRN: [id number]  This visit was conducted in person.  BP 120/60 (BP Location: Left Arm, Patient Position: Sitting, Cuff Size: Large)   Pulse 81   Temp 97.8 F (36.6 C) (Temporal)   Ht 5\' 6"  (1.676 m)   Wt 219 lb 2 oz (99.4 kg)   SpO2 96%   BMI 35.37 kg/m    CC:  Chief Complaint  Patient presents with   Annual Exam    Part 2 ([facility] 01/21/2023    Subjective:   HPI: Terri [name] is a 73 y.o. female presenting on 10/26/2023 for Annual Exam (Part 2 ([facility] 01/21/2023)  The patient presents for annual medicare wellness, complete physical and review of chronic health problems. He/She also has the following acute concerns today: Healing subcutaneous cyst S/P I and D  The patient saw a LPN or RN for medicare wellness visit. 01/21/23  Prevention and wellness was reviewed in detail. Note reviewed and important notes copied below.  Hypertension:  Well-controlled on lisinopril 20 mg daily, amlodipine 10 mg p.o. daily BP Readings from Last 3 Encounters:  10/26/23 120/60  10/15/23 124/60  10/08/23 130/70  Using medication without problems or lightheadedness:  none Chest pain with exertion: none Edema: none Short of breath: none Average home BPs: Other issues:  Elevated Cholesterol: LDL at goal less than 70 on atorvastatin 40 mg daily Lab Results  Component Value Date   CHOL 118 10/15/2023   HDL 43.10 10/15/2023   LDLCALC 56 10/15/2023   LDLDIRECT 63.3 11/22/2013   TRIG 98.0 10/15/2023   CHOLHDL 3 10/15/2023  Using medications without problems: none Muscle aches:  none Diet compliance: heart healthy, trying Exercise: minimal Other complaints:  Diabetes: Tolerable control on Trulicity 1.5 mg weekly and metformin XR 500 mg 2 tablets daily Lab Results  Component Value Date   HGBA1C 7.1 (H) 10/15/2023  Using medications without difficulties: none Hypoglycemic episodes: none Hyperglycemic episodes:  none Feet problems: none Blood Sugars averaging:  occ eye exam within last year: year  Obesity Body mass index is 35.37 kg/m. Wt Readings from Last 3 Encounters:  10/26/23 219 lb 2 oz (99.4 kg)  10/15/23 218 lb 4 oz (99 kg)  10/08/23 216 lb 6 oz (98.1 kg)         Relevant past medical, surgical, family and social history reviewed and updated as indicated. Interim medical history since our last visit reviewed. Allergies and medications reviewed and updated. Outpatient Medications Prior to Visit  Medication Sig Dispense Refill   amLODipine (NORVASC) 10 MG tablet TAKE 1 TABLET BY MOUTH DAILY 90 tablet 3   aspirin 81 MG tablet Take 81 mg by mouth daily.     atorvastatin (LIPITOR) 40 MG tablet TAKE 1 TABLET BY MOUTH DAILY 90 tablet 2   Blood Glucose Monitoring Suppl (ONETOUCH VERIO IQ SYSTEM) w/Device KIT      Calcium Carbonate (CALCIUM 600 PO) Take 1 tablet by mouth daily.     Cholecalciferol (VITAMIN D3 PO) Take 2,000 Units by mouth daily.     diclofenac (VOLTAREN) 75 MG EC tablet TAKE 1 TABLET BY MOUTH TWICE A DAY 30 tablet 0   Dulaglutide (TRULICITY) 1.5 MG/0.5ML SOPN Inject 1.5 mg into the skin once a week. 6 mL 3   glucose blood (ONETOUCH VERIO) test strip USE TO CHECK BLOOD SUGAR DAILY  DX: E11.65 100 each 3   Lancets (ONETOUCH ULTRASOFT) lancets USE TO  CHECK FASTING BLOOD SUGAR DAILY  DX: E11.65 100 each 3   lisinopril (ZESTRIL) 20 MG tablet TAKE ONE TABLET BY MOUTH DAILY 90 tablet 3   Lysine 500 MG TABS Take 1 tablet by mouth daily as needed.     metFORMIN (GLUCOPHAGE-XR) 500 MG 24 hr tablet TAKE 2 TABLETS BY MOUTH DAILY WITH BREAKFAST 180 tablet 0   nortriptyline (PAMELOR) 10 MG capsule Take 1 capsule (10 mg total) by mouth at bedtime. 90 capsule 0   nystatin cream (MYCOSTATIN) Apply 1 application topically 2 (two) times daily as needed for dry skin. 30 g 0   doxycycline (VIBRA-TABS) 100 MG tablet Take 1 tablet (100 mg total) by mouth 2 (two) times daily. 20 tablet 0   No  facility-administered medications prior to visit.     Per HPI unless specifically indicated in ROS section below Review of Systems  Constitutional:  Negative for fatigue and fever.  HENT:  Negative for congestion.   Eyes:  Negative for pain.  Respiratory:  Negative for cough and shortness of breath.   Cardiovascular:  Negative for chest pain, palpitations and leg swelling.  Gastrointestinal:  Negative for abdominal pain.  Genitourinary:  Negative for dysuria and vaginal bleeding.  Musculoskeletal:  Negative for back pain.  Neurological:  Negative for syncope, light-headedness and headaches.  Psychiatric/Behavioral:  Negative for dysphoric mood.    Objective:  BP 120/60 (BP Location: Left Arm, Patient Position: Sitting, Cuff Size: Large)   Pulse 81   Temp 97.8 F (36.6 C) (Temporal)   Ht 5\' 6"  (1.676 m)   Wt 219 lb 2 oz (99.4 kg)   SpO2 96%   BMI 35.37 kg/m   Wt Readings from Last 3 Encounters:  10/26/23 219 lb 2 oz (99.4 kg)  10/15/23 218 lb 4 oz (99 kg)  10/08/23 216 lb 6 oz (98.1 kg)      Physical Exam Vitals and nursing note reviewed.  Constitutional:      General: She is not in acute distress.    Appearance: Normal appearance. She is well-developed. She is not ill-appearing or toxic-appearing.  HENT:     Head: Normocephalic.     Right Ear: Hearing, tympanic membrane, ear canal and external ear normal.     Left Ear: Hearing, tympanic membrane, ear canal and external ear normal.     Nose: Nose normal.  Eyes:     General: Lids are normal. Lids are everted, no foreign bodies appreciated.     Conjunctiva/sclera: Conjunctivae normal.     Pupils: Pupils are equal, round, and reactive to light.  Neck:     Thyroid: No thyroid mass or thyromegaly.     Vascular: No carotid bruit.     Trachea: Trachea normal.  Cardiovascular:     Rate and Rhythm: Normal rate and regular rhythm.     Heart sounds: Normal heart sounds, S1 normal and S2 normal. No murmur heard.    No gallop.   Pulmonary:     Effort: Pulmonary effort is normal. No respiratory distress.     Breath sounds: Normal breath sounds. No wheezing, rhonchi or rales.  Abdominal:     General: Bowel sounds are normal. There is no distension or abdominal bruit.     Palpations: Abdomen is soft. There is no fluid wave or mass.     Tenderness: There is no abdominal tenderness. There is no guarding or rebound.     Hernia: No hernia is present.  Musculoskeletal:     Cervical back:  Normal range of motion and neck supple.  Lymphadenopathy:     Cervical: No cervical adenopathy.  Skin:    General: Skin is warm and dry.     Findings: No rash.  Neurological:     Mental Status: She is alert.     Cranial Nerves: No cranial nerve deficit.     Sensory: No sensory deficit.  Psychiatric:        Mood and Affect: Mood is not anxious or depressed.        Speech: Speech normal.        Behavior: Behavior normal. Behavior is cooperative.        Judgment: Judgment normal.      Diabetic foot exam: Normal inspection No skin breakdown No calluses  Normal DP pulses Normal sensation to light touch and monofilament Nails normal  Results for orders placed or performed in visit on 10/26/23  HM DIABETES FOOT EXAM   Collection Time: 10/26/23 12:00 AM  Result Value Ref Range   HM Diabetic Foot Exam done      COVID 19 screen:  No recent travel or known exposure to COVID19 The patient denies respiratory symptoms of COVID 19 at this time. The importance of social distancing was discussed today.   Assessment and Plan The patient's preventative maintenance and recommended screening tests for an annual wellness exam were reviewed in full today. Brought up to date unless services declined.  Counselled on the importance of diet, exercise, and its role in overall health and mortality. The patient's FH and SH was reviewed, including their home life, tobacco status, and drug and alcohol status.    Vaccines: uptodate Has had  COVID19 vaccines x 2 consider shingrix series, COVID #3.  Given flu today. Pap/DVE:   S/P TAH.  Mammo:  Stable 03/2023, Hx of breast cancer right arm lymphadenopathy. Bone Density: 9/26/ 2019 normal. Repeat in 5 years..  DUE Colon:  01/2018 no polyps,repeat in 10- year, Dr. Myrtie Neither Smoking Status: none ETOH/ drug use: none/none  Hep C:  done   Problem List Items Addressed This Visit     Atherosclerosis of autologous artery coronary artery bypass graft(s) with unspecified angina pectoris (HCC) (Chronic)   On Trulicity  GLP1 RA given CVD benefit.       Class 2 severe obesity with serious comorbidity and body mass index (BMI) of 37.0 to 37.9 in adult [facility])   Encouraged exercise, weight loss, healthy eating habits.       Hypertension associated with diabetes (HCC) (Chronic)   Stable, chronic.  Continue current medication.  Well-controlled on lisinopril 20 mg daily, amlodipine 10 mg p.o. daily       Type 2 diabetes mellitus with other circulatory complications HTN (HCC) (Chronic)   Improved, chronic.  Continue current medication.  Doing well with lifestyle change, heart healthy diet and increased activity over the summer months.  Trulicity 0.75 mg weekly Metformin XR 500 mg 2 tablets daily  Follow-up in 6 months        Other Visit Diagnoses       Routine general medical examination at a health care facility    -  Primary     Estrogen deficiency       Relevant Orders   DG Bone Density         Kerby Nora, MD

## 2023-10-26 NOTE — Assessment & Plan Note (Signed)
Encouraged exercise, weight loss, healthy eating habits. ? ?

## 2023-10-26 NOTE — Assessment & Plan Note (Signed)
Improved, chronic.  Continue current medication.  Doing well with lifestyle change, heart healthy diet and increased activity over the summer months.  Trulicity 0.75 mg weekly Metformin XR 500 mg 2 tablets daily  Follow-up in 6 months

## 2023-10-26 NOTE — Assessment & Plan Note (Signed)
On Trulicity  GLP1 RA given CVD benefit.

## 2023-10-26 NOTE — Addendum Note (Signed)
Addended by: Damita Lack on: 10/26/2023 12:02 PM   Modules accepted: Orders

## 2023-11-25 DIAGNOSIS — E1165 Type 2 diabetes mellitus with hyperglycemia: Secondary | ICD-10-CM | POA: Diagnosis not present

## 2023-11-25 DIAGNOSIS — H25093 Other age-related incipient cataract, bilateral: Secondary | ICD-10-CM | POA: Diagnosis not present

## 2023-11-25 LAB — HM DIABETES EYE EXAM

## 2023-12-14 ENCOUNTER — Telehealth: Payer: Self-pay | Admitting: Family Medicine

## 2023-12-14 DIAGNOSIS — E1159 Type 2 diabetes mellitus with other circulatory complications: Secondary | ICD-10-CM

## 2023-12-14 NOTE — Telephone Encounter (Signed)
 Patient will need to have urine microalbumin rechecked at her upcoming diabetes office visit April 29.  Does not have labs prior to needs to be done at office visit

## 2024-01-13 ENCOUNTER — Ambulatory Visit (INDEPENDENT_AMBULATORY_CARE_PROVIDER_SITE_OTHER): Admitting: Family Medicine

## 2024-01-13 ENCOUNTER — Encounter: Payer: Self-pay | Admitting: Family Medicine

## 2024-01-13 VITALS — BP 118/64 | HR 72 | Temp 97.7°F | Ht 66.0 in | Wt 215.4 lb

## 2024-01-13 DIAGNOSIS — I152 Hypertension secondary to endocrine disorders: Secondary | ICD-10-CM

## 2024-01-13 DIAGNOSIS — R5383 Other fatigue: Secondary | ICD-10-CM | POA: Diagnosis not present

## 2024-01-13 DIAGNOSIS — E1159 Type 2 diabetes mellitus with other circulatory complications: Secondary | ICD-10-CM | POA: Diagnosis not present

## 2024-01-13 LAB — POCT GLYCOSYLATED HEMOGLOBIN (HGB A1C): Hemoglobin A1C: 6.4 % — AB (ref 4.0–5.6)

## 2024-01-13 NOTE — Assessment & Plan Note (Addendum)
 Improved, chronic.  Continue current medication.  Doing well with lifestyle change, heart healthy diet and increased activity over the summer months.  Trulicity 1.5 mg weekly Metformin XR 500 mg 2 tablets daily  Follow-up in 3 months

## 2024-01-13 NOTE — Progress Notes (Signed)
 Patient ID: Terri [name], female    DOB: July 23, 1951, 73 y.o.   MRN: [id number]  This visit was conducted in person.  BP 118/64   Pulse 72   Temp 97.7 F (36.5 C) (Oral)   Ht 5\' 6"  (1.676 m)   Wt 215 lb 6.4 oz (97.7 kg)   SpO2 97%   BMI 34.77 kg/m    CC:  Chief Complaint  Patient presents with   Diabetes    3 month follow up    Subjective:   HPI: Terri [name] is a 73 y.o. female presenting on 01/13/2024 for Diabetes (3 month follow up)  Hypertension:  Well-controlled on lisinopril 20 mg daily, amlodipine 10 mg p.o. daily BP Readings from Last 3 Encounters:  01/13/24 118/64  10/26/23 120/60  10/15/23 124/60  Using medication without problems or lightheadedness:  none Chest pain with exertion: none Edema: none Short of breath: none Average home BPs: Other issues:  Diabetes: Good control on Trulicity 1.5 mg weekly and metformin XR 500 mg 2 tablets daily Lab Results  Component Value Date   HGBA1C 7.1 (H) 10/15/2023  Using medications without difficulties: none Hypoglycemic episodes: none Hyperglycemic episodes: none Feet problems: none Blood Sugars averaging:  114 eye exam within last year: year  Obesity   She has lost 4 lbs in last 3 months Body mass index is 34.77 kg/m. Wt Readings from Last 3 Encounters:  01/13/24 215 lb 6.4 oz (97.7 kg)  10/26/23 219 lb 2 oz (99.4 kg)  10/15/23 218 lb 4 oz (99 kg)         Relevant past medical, surgical, family and social history reviewed and updated as indicated. Interim medical history since our last visit reviewed. Allergies and medications reviewed and updated. Outpatient Medications Prior to Visit  Medication Sig Dispense Refill   amLODipine (NORVASC) 10 MG tablet TAKE 1 TABLET BY MOUTH DAILY 90 tablet 3   aspirin 81 MG tablet Take 81 mg by mouth daily.     atorvastatin (LIPITOR) 40 MG tablet TAKE 1 TABLET BY MOUTH DAILY 90 tablet 2   Blood Glucose Monitoring Suppl (ONETOUCH VERIO IQ SYSTEM) w/Device  KIT      Calcium Carbonate (CALCIUM 600 PO) Take 1 tablet by mouth daily.     Cholecalciferol (VITAMIN D3 PO) Take 2,000 Units by mouth daily.     diclofenac (VOLTAREN) 75 MG EC tablet TAKE 1 TABLET BY MOUTH TWICE A DAY 30 tablet 0   Dulaglutide (TRULICITY) 1.5 MG/0.5ML SOPN Inject 1.5 mg into the skin once a week. 6 mL 3   glucose blood (ONETOUCH VERIO) test strip USE TO CHECK BLOOD SUGAR DAILY  DX: E11.65 100 each 3   Lancets (ONETOUCH ULTRASOFT) lancets USE TO CHECK FASTING BLOOD SUGAR DAILY  DX: E11.65 100 each 3   lisinopril (ZESTRIL) 20 MG tablet TAKE ONE TABLET BY MOUTH DAILY 90 tablet 3   Lysine 500 MG TABS Take 1 tablet by mouth daily as needed.     metFORMIN (GLUCOPHAGE-XR) 500 MG 24 hr tablet TAKE 2 TABLETS BY MOUTH DAILY WITH BREAKFAST 180 tablet 0   nortriptyline (PAMELOR) 10 MG capsule Take 1 capsule (10 mg total) by mouth at bedtime. 90 capsule 0   nystatin cream (MYCOSTATIN) Apply 1 application topically 2 (two) times daily as needed for dry skin. 30 g 0   No facility-administered medications prior to visit.     Per HPI unless specifically indicated in ROS section below Review of Systems  Constitutional:  Negative for fatigue and fever.  HENT:  Negative for congestion.   Eyes:  Negative for pain.  Respiratory:  Negative for cough and shortness of breath.   Cardiovascular:  Negative for chest pain, palpitations and leg swelling.  Gastrointestinal:  Negative for abdominal pain.  Genitourinary:  Negative for dysuria and vaginal bleeding.  Musculoskeletal:  Negative for back pain.  Neurological:  Negative for syncope, light-headedness and headaches.  Psychiatric/Behavioral:  Negative for dysphoric mood.    Objective:  BP 118/64   Pulse 72   Temp 97.7 F (36.5 C) (Oral)   Ht 5\' 6"  (1.676 m)   Wt 215 lb 6.4 oz (97.7 kg)   SpO2 97%   BMI 34.77 kg/m   Wt Readings from Last 3 Encounters:  01/13/24 215 lb 6.4 oz (97.7 kg)  10/26/23 219 lb 2 oz (99.4 kg)  10/15/23 218  lb 4 oz (99 kg)      Physical Exam Vitals and nursing note reviewed.  Constitutional:      General: She is not in acute distress.    Appearance: Normal appearance. She is well-developed. She is not ill-appearing or toxic-appearing.  HENT:     Head: Normocephalic.     Right Ear: Hearing, tympanic membrane, ear canal and external ear normal.     Left Ear: Hearing, tympanic membrane, ear canal and external ear normal.     Nose: Nose normal.  Eyes:     General: Lids are normal. Lids are everted, no foreign bodies appreciated.     Conjunctiva/sclera: Conjunctivae normal.     Pupils: Pupils are equal, round, and reactive to light.  Neck:     Thyroid: No thyroid mass or thyromegaly.     Vascular: No carotid bruit.     Trachea: Trachea normal.  Cardiovascular:     Rate and Rhythm: Normal rate and regular rhythm.     Heart sounds: Normal heart sounds, S1 normal and S2 normal. No murmur heard.    No gallop.  Pulmonary:     Effort: Pulmonary effort is normal. No respiratory distress.     Breath sounds: Normal breath sounds. No wheezing, rhonchi or rales.  Abdominal:     General: Bowel sounds are normal. There is no distension or abdominal bruit.     Palpations: Abdomen is soft. There is no fluid wave or mass.     Tenderness: There is no abdominal tenderness. There is no guarding or rebound.     Hernia: No hernia is present.  Musculoskeletal:     Cervical back: Normal range of motion and neck supple.  Lymphadenopathy:     Cervical: No cervical adenopathy.  Skin:    General: Skin is warm and dry.     Findings: No rash.  Neurological:     Mental Status: She is alert.     Cranial Nerves: No cranial nerve deficit.     Sensory: No sensory deficit.  Psychiatric:        Mood and Affect: Mood is not anxious or depressed.        Speech: Speech normal.        Behavior: Behavior normal. Behavior is cooperative.        Judgment: Judgment normal.      Diabetic foot exam: Normal  inspection No skin breakdown No calluses  Normal DP pulses Normal sensation to light touch and monofilament Nails normal  Results for orders placed or performed in visit on 10/26/23  HM DIABETES FOOT EXAM  Collection Time: 10/26/23 12:00 AM  Result Value Ref Range   HM Diabetic Foot Exam done      COVID 19 screen:  No recent travel or known exposure to COVID19 The patient denies respiratory symptoms of COVID 19 at this time. The importance of social distancing was discussed today.   Assessment and Plan The patient's preventative maintenance and recommended screening tests for an annual wellness exam were reviewed in full today. Brought up to date unless services declined.  Counselled on the importance of diet, exercise, and its role in overall health and mortality. The patient's FH and SH was reviewed, including their home life, tobacco status, and drug and alcohol status.    Vaccines: uptodate Has had COVID19 vaccines x 2 consider shingrix series, COVID #3.  Given flu today. Pap/DVE:   S/P TAH.  Mammo:  Stable 03/2023, Hx of breast cancer right arm lymphadenopathy. Bone Density: 9/26/ 2019 normal. Repeat in 5 years..  DUE Colon:  01/2018 no polyps,repeat in 10- year, Dr. Dominic Friendly Smoking Status: none ETOH/ drug use: none/none  Hep C:  done   Problem List Items Addressed This Visit     Type 2 diabetes mellitus with other circulatory complications HTN (HCC) - Primary (Chronic)   Relevant Orders   POCT glycosylated hemoglobin (Hb A1C)      Herby Lolling, MD

## 2024-01-13 NOTE — Assessment & Plan Note (Signed)
 Stable, chronic.  Continue current medication.  Well-controlled on lisinopril 20 mg daily, amlodipine 10 mg p.o. daily

## 2024-01-13 NOTE — Assessment & Plan Note (Signed)
 Eval with labs.  Likely related to poor sleep...  reviewed sleep hygiene. She is not interested in med to treat at this time but we did discuss trazodone trail or possible treatment of nocturia with PM oxybutynin given no daytime symptoms.

## 2024-01-14 ENCOUNTER — Other Ambulatory Visit: Payer: Self-pay | Admitting: Family Medicine

## 2024-01-21 ENCOUNTER — Other Ambulatory Visit: Payer: Self-pay | Admitting: Family Medicine

## 2024-01-25 ENCOUNTER — Ambulatory Visit: Payer: Medicare Other | Admitting: Family Medicine

## 2024-04-03 DIAGNOSIS — M19071 Primary osteoarthritis, right ankle and foot: Secondary | ICD-10-CM | POA: Diagnosis not present

## 2024-04-03 DIAGNOSIS — M180 Bilateral primary osteoarthritis of first carpometacarpal joints: Secondary | ICD-10-CM | POA: Diagnosis not present

## 2024-04-03 DIAGNOSIS — M19072 Primary osteoarthritis, left ankle and foot: Secondary | ICD-10-CM | POA: Diagnosis not present

## 2024-04-03 DIAGNOSIS — M19041 Primary osteoarthritis, right hand: Secondary | ICD-10-CM | POA: Diagnosis not present

## 2024-04-03 DIAGNOSIS — M720 Palmar fascial fibromatosis [Dupuytren]: Secondary | ICD-10-CM | POA: Diagnosis not present

## 2024-04-03 DIAGNOSIS — M19042 Primary osteoarthritis, left hand: Secondary | ICD-10-CM | POA: Diagnosis not present

## 2024-04-03 DIAGNOSIS — R768 Other specified abnormal immunological findings in serum: Secondary | ICD-10-CM | POA: Diagnosis not present

## 2024-04-06 ENCOUNTER — Other Ambulatory Visit

## 2024-04-10 DIAGNOSIS — R92323 Mammographic fibroglandular density, bilateral breasts: Secondary | ICD-10-CM | POA: Diagnosis not present

## 2024-04-10 DIAGNOSIS — Z1231 Encounter for screening mammogram for malignant neoplasm of breast: Secondary | ICD-10-CM | POA: Diagnosis not present

## 2024-04-10 LAB — HM MAMMOGRAPHY

## 2024-04-13 ENCOUNTER — Ambulatory Visit: Admitting: Family Medicine

## 2024-04-13 ENCOUNTER — Other Ambulatory Visit: Payer: Self-pay | Admitting: Family Medicine

## 2024-04-18 DIAGNOSIS — R768 Other specified abnormal immunological findings in serum: Secondary | ICD-10-CM | POA: Diagnosis not present

## 2024-04-18 DIAGNOSIS — M25642 Stiffness of left hand, not elsewhere classified: Secondary | ICD-10-CM | POA: Diagnosis not present

## 2024-04-18 DIAGNOSIS — M25641 Stiffness of right hand, not elsewhere classified: Secondary | ICD-10-CM | POA: Diagnosis not present

## 2024-04-18 DIAGNOSIS — M720 Palmar fascial fibromatosis [Dupuytren]: Secondary | ICD-10-CM | POA: Diagnosis not present

## 2024-04-27 ENCOUNTER — Other Ambulatory Visit

## 2024-04-27 ENCOUNTER — Ambulatory Visit: Payer: Self-pay | Admitting: Family Medicine

## 2024-04-27 DIAGNOSIS — E1159 Type 2 diabetes mellitus with other circulatory complications: Secondary | ICD-10-CM

## 2024-04-27 DIAGNOSIS — R5383 Other fatigue: Secondary | ICD-10-CM | POA: Diagnosis not present

## 2024-04-27 LAB — BASIC METABOLIC PANEL WITH GFR
BUN: 8 mg/dL (ref 6–23)
CO2: 34 meq/L — ABNORMAL HIGH (ref 19–32)
Calcium: 9.2 mg/dL (ref 8.4–10.5)
Chloride: 104 meq/L (ref 96–112)
Creatinine, Ser: 0.77 mg/dL (ref 0.40–1.20)
GFR: 75.84 mL/min (ref >60.00)
Glucose, Bld: 112 mg/dL — ABNORMAL HIGH (ref 70–99)
Potassium: 4.4 meq/L (ref 3.5–5.1)
Sodium: 144 meq/L (ref 135–145)

## 2024-04-27 LAB — MICROALBUMIN / CREATININE URINE RATIO
Creatinine,U: 109.9 mg/dL
Microalb Creat Ratio: 59 mg/g — ABNORMAL HIGH (ref 0.0–30.0)
Microalb, Ur: 6.5 mg/dL — ABNORMAL HIGH (ref 0.0–1.9)

## 2024-04-27 LAB — CBC WITH DIFFERENTIAL/PLATELET
Basophils Absolute: 0.1 K/uL (ref 0.0–0.1)
Basophils Relative: 1.4 % (ref 0.0–3.0)
Eosinophils Absolute: 0.2 K/uL (ref 0.0–0.7)
Eosinophils Relative: 3.7 % (ref 0.0–5.0)
HCT: 41 % (ref 36.0–46.0)
Hemoglobin: 13.6 g/dL (ref 12.0–15.0)
Lymphocytes Relative: 25.9 % (ref 12.0–46.0)
Lymphs Abs: 1.1 K/uL (ref 0.7–4.0)
MCHC: 33.2 g/dL (ref 30.0–36.0)
MCV: 88.3 fl (ref 78.0–100.0)
Monocytes Absolute: 0.3 K/uL (ref 0.1–1.0)
Monocytes Relative: 7.7 % (ref 3.0–12.0)
Neutro Abs: 2.6 K/uL (ref 1.4–7.7)
Neutrophils Relative %: 61.3 % (ref 43.0–77.0)
Platelets: 276 K/uL (ref 150.0–400.0)
RBC: 4.64 Mil/uL (ref 3.87–5.11)
RDW: 13.6 % (ref 11.5–15.5)
WBC: 4.2 K/uL (ref 4.0–10.5)

## 2024-04-27 LAB — VITAMIN D 25 HYDROXY (VIT D DEFICIENCY, FRACTURES): VITD: 32.41 ng/mL (ref 30.00–100.00)

## 2024-04-27 LAB — TSH: TSH: 1.63 u[IU]/mL (ref 0.35–5.50)

## 2024-04-27 LAB — VITAMIN B12: Vitamin B-12: 201 pg/mL — ABNORMAL LOW (ref 211–911)

## 2024-04-27 NOTE — Progress Notes (Signed)
 No critical labs need to be addressed urgently. We will discuss labs in detail at upcoming office visit.

## 2024-05-04 ENCOUNTER — Ambulatory Visit: Admitting: Family Medicine

## 2024-05-09 ENCOUNTER — Ambulatory Visit: Payer: Self-pay | Admitting: Family Medicine

## 2024-05-09 ENCOUNTER — Encounter: Payer: Self-pay | Admitting: Family Medicine

## 2024-05-09 ENCOUNTER — Ambulatory Visit: Admitting: Family Medicine

## 2024-05-09 VITALS — BP 134/70 | HR 81 | Temp 97.5°F | Ht 66.0 in | Wt 216.0 lb

## 2024-05-09 DIAGNOSIS — Z7984 Long term (current) use of oral hypoglycemic drugs: Secondary | ICD-10-CM | POA: Diagnosis not present

## 2024-05-09 DIAGNOSIS — R809 Proteinuria, unspecified: Secondary | ICD-10-CM

## 2024-05-09 DIAGNOSIS — Z7985 Long-term (current) use of injectable non-insulin antidiabetic drugs: Secondary | ICD-10-CM

## 2024-05-09 DIAGNOSIS — E1159 Type 2 diabetes mellitus with other circulatory complications: Secondary | ICD-10-CM | POA: Diagnosis not present

## 2024-05-09 DIAGNOSIS — I152 Hypertension secondary to endocrine disorders: Secondary | ICD-10-CM | POA: Diagnosis not present

## 2024-05-09 LAB — HEMOGLOBIN A1C: Hgb A1c MFr Bld: 7 % — ABNORMAL HIGH (ref 4.6–6.5)

## 2024-05-09 NOTE — Progress Notes (Signed)
 Patient ID: Terri [name], female    DOB: October 24, 1950, 73 y.o.   MRN: [id number]  This visit was conducted in person.  BP 134/70   Pulse 81   Temp (!) 97.5 F (36.4 C) (Temporal)   Ht 5' 6 (1.676 m)   Wt 216 lb (98 kg)   SpO2 97%   BMI 34.86 kg/m    CC:  Chief Complaint  Patient presents with   Diabetes    Subjective:   HPI: Terri [name] is a 73 y.o. female presenting on 05/09/2024 for Diabetes  Hypertension:  Well-controlled on lisinopril  20 mg daily, amlodipine  10 mg p.o. daily BP Readings from Last 3 Encounters:  05/09/24 134/70  01/13/24 118/64  10/26/23 120/60  Using medication without problems or lightheadedness:  none Chest pain with exertion: none Edema: none Short of breath: none Average home BPs: Other issues:  Diabetes: Good control on Trulicity  1.5 mg weekly and metformin  XR 500 mg 2 tablets daily Lab Results  Component Value Date   HGBA1C 7.0 (H) 05/09/2024  Using medications without difficulties: none Hypoglycemic episodes: none Hyperglycemic episodes: none Feet problems: none Blood Sugars averaging:  114 eye exam within last year: year  Obesity   Body mass index is 34.86 kg/m. Wt Readings from Last 3 Encounters:  05/09/24 216 lb (98 kg)  01/13/24 215 lb 6.4 oz (97.7 kg)  10/26/23 219 lb 2 oz (99.4 kg)    B12 low  taking supplement off and on  Fatigue off and on on...       Relevant past medical, surgical, family and social history reviewed and updated as indicated. Interim medical history since our last visit reviewed. Allergies and medications reviewed and updated. Outpatient Medications Prior to Visit  Medication Sig Dispense Refill   amLODipine  (NORVASC ) 10 MG tablet TAKE 1 TABLET BY MOUTH DAILY 90 tablet 3   aspirin 81 MG tablet Take 81 mg by mouth daily.     atorvastatin  (LIPITOR) 40 MG tablet TAKE 1 TABLET BY MOUTH DAILY 90 tablet 2   Blood Glucose Monitoring Suppl (ONETOUCH VERIO IQ SYSTEM) w/Device KIT      Calcium   Carbonate (CALCIUM  600 PO) Take 1 tablet by mouth daily.     Cholecalciferol (VITAMIN D3 PO) Take 2,000 Units by mouth daily.     diclofenac  (VOLTAREN ) 75 MG EC tablet TAKE 1 TABLET BY MOUTH TWICE A DAY 30 tablet 0   glucose blood (ONETOUCH VERIO) test strip USE TO CHECK BLOOD SUGAR DAILY  DX: E11.65 100 each 3   Lancets (ONETOUCH ULTRASOFT) lancets USE TO CHECK FASTING BLOOD SUGAR DAILY  DX: E11.65 100 each 3   lisinopril  (ZESTRIL ) 20 MG tablet TAKE ONE TABLET BY MOUTH DAILY 90 tablet 3   Lysine  500 MG TABS Take 1 tablet by mouth daily as needed.     metFORMIN  (GLUCOPHAGE -XR) 500 MG 24 hr tablet TAKE 2 TABLETS BY MOUTH DAILY WITH BREAKFAST 180 tablet 1   nortriptyline  (PAMELOR ) 10 MG capsule Take 1 capsule (10 mg total) by mouth at bedtime. 90 capsule 0   nystatin  cream (MYCOSTATIN ) Apply 1 application topically 2 (two) times daily as needed for dry skin. 30 g 0   TRULICITY  1.5 MG/0.5ML SOAJ INJECT 1.5MG  UNDER THE SKIN ONCE A WEEK 6 mL 3   No facility-administered medications prior to visit.     Per HPI unless specifically indicated in ROS section below Review of Systems  Constitutional:  Negative for fatigue and fever.  HENT:  Negative for congestion.   Eyes:  Negative for pain.  Respiratory:  Negative for cough and shortness of breath.   Cardiovascular:  Negative for chest pain, palpitations and leg swelling.  Gastrointestinal:  Negative for abdominal pain.  Genitourinary:  Negative for dysuria and vaginal bleeding.  Musculoskeletal:  Negative for back pain.  Neurological:  Negative for syncope, light-headedness and headaches.  Psychiatric/Behavioral:  Negative for dysphoric mood.    Objective:  BP 134/70   Pulse 81   Temp (!) 97.5 F (36.4 C) (Temporal)   Ht 5' 6 (1.676 m)   Wt 216 lb (98 kg)   SpO2 97%   BMI 34.86 kg/m   Wt Readings from Last 3 Encounters:  05/09/24 216 lb (98 kg)  01/13/24 215 lb 6.4 oz (97.7 kg)  10/26/23 219 lb 2 oz (99.4 kg)      Physical  Exam Vitals and nursing note reviewed.  Constitutional:      General: She is not in acute distress.    Appearance: Normal appearance. She is well-developed. She is not ill-appearing or toxic-appearing.  HENT:     Head: Normocephalic.     Right Ear: Hearing, tympanic membrane, ear canal and external ear normal.     Left Ear: Hearing, tympanic membrane, ear canal and external ear normal.     Nose: Nose normal.  Eyes:     General: Lids are normal. Lids are everted, no foreign bodies appreciated.     Conjunctiva/sclera: Conjunctivae normal.     Pupils: Pupils are equal, round, and reactive to light.  Neck:     Thyroid : No thyroid  mass or thyromegaly.     Vascular: No carotid bruit.     Trachea: Trachea normal.  Cardiovascular:     Rate and Rhythm: Normal rate and regular rhythm.     Heart sounds: Normal heart sounds, S1 normal and S2 normal. No murmur heard.    No gallop.  Pulmonary:     Effort: Pulmonary effort is normal. No respiratory distress.     Breath sounds: Normal breath sounds. No wheezing, rhonchi or rales.  Abdominal:     General: Bowel sounds are normal. There is no distension or abdominal bruit.     Palpations: Abdomen is soft. There is no fluid wave or mass.     Tenderness: There is no abdominal tenderness. There is no guarding or rebound.     Hernia: No hernia is present.  Musculoskeletal:     Cervical back: Normal range of motion and neck supple.  Lymphadenopathy:     Cervical: No cervical adenopathy.  Skin:    General: Skin is warm and dry.     Findings: No rash.  Neurological:     Mental Status: She is alert.     Cranial Nerves: No cranial nerve deficit.     Sensory: No sensory deficit.  Psychiatric:        Mood and Affect: Mood is not anxious or depressed.        Speech: Speech normal.        Behavior: Behavior normal. Behavior is cooperative.        Judgment: Judgment normal.      Diabetic foot exam: Normal inspection No skin breakdown No calluses   Normal DP pulses Normal sensation to light touch and monofilament Nails normal  Results for orders placed or performed in visit on 05/09/24  HM DIABETES EYE EXAM   Collection Time: 11/25/23 12:00 AM  Result Value Ref Range   HM  Diabetic Eye Exam No Retinopathy No Retinopathy  Hemoglobin A1c   Collection Time: 05/09/24 12:40 PM  Result Value Ref Range   Hgb A1c MFr Bld 7.0 (H) 4.6 - 6.5 %     COVID 19 screen:  No recent travel or known exposure to COVID19 The patient denies respiratory symptoms of COVID 19 at this time. The importance of social distancing was discussed today.   Assessment and Plan   Problem List Items Addressed This Visit     Hypertension associated with diabetes (HCC) (Chronic)   Stable, chronic.  Continue current medication.  Well-controlled on lisinopril  20 mg daily, amlodipine  10 mg p.o. daily       Microalbuminuria   New diagnosis Discussed SGLT2 inhibitors for diabetes control.  She is already on GLP-1 as well as ACE inhibitor. Stressed need for ideal diabetes control.  Will reassess in 3 to 6 months.  If persistent can always reach consider SGLT2 inhibitor addition at that time.      Type 2 diabetes mellitus with other circulatory complications HTN (HCC) - Primary (Chronic)   Chronic, due for reevaluation.  Her husband feels that point-of-care A1c may have been inaccurate last time.  He feels her home blood sugars do not represent the lower number A1c.  He request that we do a blood draw for A1c. She is agreeable to switching from Trulicity  to Mounjaro  for hopefully better diabetes management and more weight loss.  We did briefly discuss possible change/addition to SGLT2 inhibitor given new diagnosis of microalbuminuria.      Relevant Orders   Hemoglobin A1c (Completed)       Greig Ring, MD

## 2024-05-09 NOTE — Patient Instructions (Signed)
 Start B12 1000 mcg daily

## 2024-05-10 DIAGNOSIS — R809 Proteinuria, unspecified: Secondary | ICD-10-CM | POA: Insufficient documentation

## 2024-05-10 MED ORDER — TIRZEPATIDE 5 MG/0.5ML ~~LOC~~ SOAJ: 5.0000 mg | SUBCUTANEOUS | 1 refills | Status: DC

## 2024-05-10 NOTE — Assessment & Plan Note (Signed)
 New diagnosis Discussed SGLT2 inhibitors for diabetes control.  She is already on GLP-1 as well as ACE inhibitor. Stressed need for ideal diabetes control.  Will reassess in 3 to 6 months.  If persistent can always reach consider SGLT2 inhibitor addition at that time.

## 2024-05-10 NOTE — Assessment & Plan Note (Signed)
 Stable, chronic.  Continue current medication.  Well-controlled on lisinopril 20 mg daily, amlodipine 10 mg p.o. daily

## 2024-05-10 NOTE — Assessment & Plan Note (Signed)
 Chronic, due for reevaluation.  Her husband feels that point-of-care A1c may have been inaccurate last time.  He feels her home blood sugars do not represent the lower number A1c.  He request that we do a blood draw for A1c. She is agreeable to switching from Trulicity  to Mounjaro  for hopefully better diabetes management and more weight loss.  We did briefly discuss possible change/addition to SGLT2 inhibitor given new diagnosis of microalbuminuria.

## 2024-05-23 DIAGNOSIS — M720 Palmar fascial fibromatosis [Dupuytren]: Secondary | ICD-10-CM | POA: Diagnosis not present

## 2024-06-12 ENCOUNTER — Other Ambulatory Visit: Payer: Self-pay | Admitting: Family Medicine

## 2024-07-14 ENCOUNTER — Other Ambulatory Visit: Payer: Self-pay | Admitting: Family Medicine

## 2024-07-26 ENCOUNTER — Telehealth: Payer: Self-pay | Admitting: *Deleted

## 2024-07-26 DIAGNOSIS — E1159 Type 2 diabetes mellitus with other circulatory complications: Secondary | ICD-10-CM

## 2024-07-26 DIAGNOSIS — E1169 Type 2 diabetes mellitus with other specified complication: Secondary | ICD-10-CM

## 2024-07-26 NOTE — Telephone Encounter (Signed)
Ordered appropriate labs

## 2024-07-26 NOTE — Telephone Encounter (Signed)
 Copied from CRM [id number]. Topic: Appointments - Appointment Info/Confirmation >> Jul 26, 2024 10:34 AM Rosina BIRCH wrote: Patient/patient representative is calling for information regarding an appointment.   Patient called wanting to see if she could get lab work done before her appointment on 11/12 [phone]

## 2024-07-26 NOTE — Telephone Encounter (Signed)
 Please advise if pre labs are needed prior to her appointment on 08/09/2024.

## 2024-08-01 ENCOUNTER — Other Ambulatory Visit

## 2024-08-01 ENCOUNTER — Ambulatory Visit: Payer: Self-pay | Admitting: Family Medicine

## 2024-08-01 ENCOUNTER — Other Ambulatory Visit (INDEPENDENT_AMBULATORY_CARE_PROVIDER_SITE_OTHER)

## 2024-08-01 DIAGNOSIS — E1159 Type 2 diabetes mellitus with other circulatory complications: Secondary | ICD-10-CM | POA: Diagnosis not present

## 2024-08-01 DIAGNOSIS — E1169 Type 2 diabetes mellitus with other specified complication: Secondary | ICD-10-CM | POA: Diagnosis not present

## 2024-08-01 DIAGNOSIS — E785 Hyperlipidemia, unspecified: Secondary | ICD-10-CM

## 2024-08-01 LAB — COMPREHENSIVE METABOLIC PANEL WITH GFR
ALT: 32 U/L (ref 0–35)
AST: 21 U/L (ref 0–37)
Albumin: 4 g/dL (ref 3.5–5.2)
Alkaline Phosphatase: 94 U/L (ref 39–117)
BUN: 10 mg/dL (ref 6–23)
CO2: 32 meq/L (ref 19–32)
Calcium: 9.2 mg/dL (ref 8.4–10.5)
Chloride: 103 meq/L (ref 96–112)
Creatinine, Ser: 0.74 mg/dL (ref 0.40–1.20)
GFR: 79.4 mL/min (ref >60.00)
Glucose, Bld: 85 mg/dL (ref 70–99)
Potassium: 3.9 meq/L (ref 3.5–5.1)
Sodium: 142 meq/L (ref 135–145)
Total Bilirubin: 0.6 mg/dL (ref 0.2–1.2)
Total Protein: 6.9 g/dL (ref 6.0–8.3)

## 2024-08-01 LAB — LIPID PANEL
Cholesterol: 130 mg/dL (ref 0–200)
HDL: 49.4 mg/dL (ref >39.00)
LDL Cholesterol: 65 mg/dL (ref 0–99)
NonHDL: 80.89
Total CHOL/HDL Ratio: 3
Triglycerides: 77 mg/dL (ref 0.0–149.0)
VLDL: 15.4 mg/dL (ref 0.0–40.0)

## 2024-08-01 LAB — HEMOGLOBIN A1C: Hgb A1c MFr Bld: 6.6 % — ABNORMAL HIGH (ref 4.6–6.5)

## 2024-08-01 NOTE — Progress Notes (Signed)
 No critical labs need to be addressed urgently. We will discuss labs in detail at upcoming office visit.

## 2024-08-02 DIAGNOSIS — M722 Plantar fascial fibromatosis: Secondary | ICD-10-CM | POA: Diagnosis not present

## 2024-08-02 DIAGNOSIS — E1165 Type 2 diabetes mellitus with hyperglycemia: Secondary | ICD-10-CM | POA: Diagnosis not present

## 2024-08-02 DIAGNOSIS — Q666 Other congenital valgus deformities of feet: Secondary | ICD-10-CM | POA: Diagnosis not present

## 2024-08-02 DIAGNOSIS — M720 Palmar fascial fibromatosis [Dupuytren]: Secondary | ICD-10-CM | POA: Diagnosis not present

## 2024-08-09 ENCOUNTER — Ambulatory Visit (INDEPENDENT_AMBULATORY_CARE_PROVIDER_SITE_OTHER): Admitting: Family Medicine

## 2024-08-09 VITALS — BP 124/74 | HR 71 | Temp 98.6°F | Ht 66.0 in | Wt 207.8 lb

## 2024-08-09 DIAGNOSIS — I152 Hypertension secondary to endocrine disorders: Secondary | ICD-10-CM

## 2024-08-09 DIAGNOSIS — E66812 Obesity, class 2: Secondary | ICD-10-CM

## 2024-08-09 DIAGNOSIS — R809 Proteinuria, unspecified: Secondary | ICD-10-CM

## 2024-08-09 DIAGNOSIS — Z6837 Body mass index (BMI) 37.0-37.9, adult: Secondary | ICD-10-CM

## 2024-08-09 DIAGNOSIS — E1159 Type 2 diabetes mellitus with other circulatory complications: Secondary | ICD-10-CM

## 2024-08-09 DIAGNOSIS — E1169 Type 2 diabetes mellitus with other specified complication: Secondary | ICD-10-CM | POA: Diagnosis not present

## 2024-08-09 DIAGNOSIS — E785 Hyperlipidemia, unspecified: Secondary | ICD-10-CM

## 2024-08-09 DIAGNOSIS — Z23 Encounter for immunization: Secondary | ICD-10-CM | POA: Diagnosis not present

## 2024-08-09 DIAGNOSIS — I25729 Atherosclerosis of autologous artery coronary artery bypass graft(s) with unspecified angina pectoris: Secondary | ICD-10-CM

## 2024-08-09 NOTE — Assessment & Plan Note (Signed)
 Stable, chronic.  Continue current medication.  Well-controlled on lisinopril 20 mg daily, amlodipine 10 mg p.o. daily

## 2024-08-09 NOTE — Assessment & Plan Note (Addendum)
 Noted in August 2025.  Given improvement in diabetes control, patient would like to recheck in 3 months.  Currently on GLP-1 medication, ACE inhibitor, not on SGLT2 inhibitor

## 2024-08-09 NOTE — Assessment & Plan Note (Signed)
Stable, chronic.  Continue current medication.   LDL at goal less than 70 on atorvastatin 40 mg daily

## 2024-08-09 NOTE — Assessment & Plan Note (Signed)
 Chronic, well controlled.  Mounjaro  5 mg weekly

## 2024-08-09 NOTE — Assessment & Plan Note (Signed)
 Weight loss on GLP1.. better with Mounjaro  then Trulicity .Encouraged exercise, weight loss, healthy eating habits.

## 2024-08-09 NOTE — Progress Notes (Signed)
 Patient ID: Terri [name], female    DOB: 1951-04-24, 73 y.o.   MRN: [id number]  This visit was conducted in person.  BP 124/74   Pulse 71   Temp 98.6 F (37 C) (Oral)   Ht 5' 6 (1.676 m)   Wt 207 lb 12.8 oz (94.3 kg)   SpO2 98%   BMI 33.54 kg/m    CC:  Chief Complaint  Patient presents with   Follow-up    Patient states nothing to discuss.     Subjective:   HPI: Terri [name] is a 73 y.o. female presenting on 08/09/2024 for Follow-up (Patient states nothing to discuss. )  Hypertension:  Well-controlled on lisinopril  20 mg daily, amlodipine  10 mg p.o. daily BP Readings from Last 3 Encounters:  08/09/24 124/74  05/09/24 134/70  01/13/24 118/64  Using medication without problems or lightheadedness:  none Chest pain with exertion: none Edema: none Short of breath: none Average home BPs: not checking Other issues:  Diabetes: Good control on  Mounjaro  5 mg weekly and metformin  XR 500 mg 2 tablets daily Lab Results  Component Value Date   HGBA1C 6.6 (H) 08/01/2024  Using medications without difficulties: none Hypoglycemic episodes: none Hyperglycemic episodes: none Feet problems: none Blood Sugars averaging:  85- eye exam within last year: year  Obesity  She has lost 10 lbs since Mounjaro  start.. has noted decreased appetite Body mass index is 33.54 kg/m. Wt Readings from Last 3 Encounters:  08/09/24 207 lb 12.8 oz (94.3 kg)  05/09/24 216 lb (98 kg)  01/13/24 215 lb 6.4 oz (97.7 kg)   Elevated Cholesterol:  LDL at goal < 70 on  atorvastatin  40 mg daily Lab Results  Component Value Date   CHOL 130 08/01/2024   HDL 49.40 08/01/2024   LDLCALC 65 08/01/2024   LDLDIRECT 63.3 11/22/2013   TRIG 77.0 08/01/2024   CHOLHDL 3 08/01/2024  Using medications without problems: Muscle aches:  Diet compliance: good Exercise: walking Other complaints:       Relevant past medical, surgical, family and social history reviewed and updated as indicated.  Interim medical history since our last visit reviewed. Allergies and medications reviewed and updated. Outpatient Medications Prior to Visit  Medication Sig Dispense Refill   amLODipine  (NORVASC ) 10 MG tablet TAKE 1 TABLET BY MOUTH DAILY 90 tablet 3   aspirin 81 MG tablet Take 81 mg by mouth daily.     atorvastatin  (LIPITOR) 40 MG tablet TAKE 1 TABLET BY MOUTH DAILY 90 tablet 1   Blood Glucose Monitoring Suppl (ONETOUCH VERIO IQ SYSTEM) w/Device KIT      Calcium  Carbonate (CALCIUM  600 PO) Take 1 tablet by mouth daily.     Cholecalciferol (VITAMIN D3 PO) Take 2,000 Units by mouth daily.     diclofenac  (VOLTAREN ) 75 MG EC tablet TAKE 1 TABLET BY MOUTH TWICE A DAY 30 tablet 0   glucose blood (ONETOUCH VERIO) test strip USE TO CHECK BLOOD SUGAR DAILY  DX: E11.65 100 each 3   Lancets (ONETOUCH ULTRASOFT) lancets USE TO CHECK FASTING BLOOD SUGAR DAILY  DX: E11.65 100 each 3   lisinopril  (ZESTRIL ) 20 MG tablet TAKE ONE TABLET BY MOUTH DAILY 90 tablet 3   Lysine  500 MG TABS Take 1 tablet by mouth daily as needed.     metFORMIN  (GLUCOPHAGE -XR) 500 MG 24 hr tablet TAKE 2 TABLETS BY MOUTH DAILY WITH BREAKFAST 180 tablet 1   nortriptyline  (PAMELOR ) 10 MG capsule Take 1 capsule (10 mg total)  by mouth at bedtime. 90 capsule 0   nystatin  cream (MYCOSTATIN ) Apply 1 application topically 2 (two) times daily as needed for dry skin. 30 g 0   tirzepatide  (MOUNJARO ) 5 MG/0.5ML Pen Inject 5 mg into the skin once a week. 6 mL 1   No facility-administered medications prior to visit.     Per HPI unless specifically indicated in ROS section below Review of Systems  Constitutional:  Negative for fatigue and fever.  HENT:  Negative for congestion.   Eyes:  Negative for pain.  Respiratory:  Negative for cough and shortness of breath.   Cardiovascular:  Negative for chest pain, palpitations and leg swelling.  Gastrointestinal:  Negative for abdominal pain.  Genitourinary:  Negative for dysuria and vaginal bleeding.   Musculoskeletal:  Negative for back pain.  Neurological:  Negative for syncope, light-headedness and headaches.  Psychiatric/Behavioral:  Negative for dysphoric mood.    Objective:  BP 124/74   Pulse 71   Temp 98.6 F (37 C) (Oral)   Ht 5' 6 (1.676 m)   Wt 207 lb 12.8 oz (94.3 kg)   SpO2 98%   BMI 33.54 kg/m   Wt Readings from Last 3 Encounters:  08/09/24 207 lb 12.8 oz (94.3 kg)  05/09/24 216 lb (98 kg)  01/13/24 215 lb 6.4 oz (97.7 kg)      Physical Exam Vitals and nursing note reviewed.  Constitutional:      General: She is not in acute distress.    Appearance: Normal appearance. She is well-developed. She is not ill-appearing or toxic-appearing.  HENT:     Head: Normocephalic.     Right Ear: Hearing, tympanic membrane, ear canal and external ear normal.     Left Ear: Hearing, tympanic membrane, ear canal and external ear normal.     Nose: Nose normal.  Eyes:     General: Lids are normal. Lids are everted, no foreign bodies appreciated.     Conjunctiva/sclera: Conjunctivae normal.     Pupils: Pupils are equal, round, and reactive to light.  Neck:     Thyroid : No thyroid  mass or thyromegaly.     Vascular: No carotid bruit.     Trachea: Trachea normal.  Cardiovascular:     Rate and Rhythm: Normal rate and regular rhythm.     Heart sounds: Normal heart sounds, S1 normal and S2 normal. No murmur heard.    No gallop.  Pulmonary:     Effort: Pulmonary effort is normal. No respiratory distress.     Breath sounds: Normal breath sounds. No wheezing, rhonchi or rales.  Abdominal:     General: Bowel sounds are normal. There is no distension or abdominal bruit.     Palpations: Abdomen is soft. There is no fluid wave or mass.     Tenderness: There is no abdominal tenderness. There is no guarding or rebound.     Hernia: No hernia is present.  Musculoskeletal:     Cervical back: Normal range of motion and neck supple.  Lymphadenopathy:     Cervical: No cervical adenopathy.   Skin:    General: Skin is warm and dry.     Findings: No rash.  Neurological:     Mental Status: She is alert.     Cranial Nerves: No cranial nerve deficit.     Sensory: No sensory deficit.  Psychiatric:        Mood and Affect: Mood is not anxious or depressed.        Speech: Speech  normal.        Behavior: Behavior normal. Behavior is cooperative.        Judgment: Judgment normal.       Results for orders placed or performed in visit on 08/01/24  Comprehensive metabolic panel   Collection Time: 08/01/24  9:45 AM  Result Value Ref Range   Sodium 142 135 - 145 mEq/L   Potassium 3.9 3.5 - 5.1 mEq/L   Chloride 103 96 - 112 mEq/L   CO2 32 19 - 32 mEq/L   Glucose, Bld 85 70 - 99 mg/dL   BUN 10 6 - 23 mg/dL   Creatinine, Ser 9.25 0.40 - 1.20 mg/dL   Total Bilirubin 0.6 0.2 - 1.2 mg/dL   Alkaline Phosphatase 94 39 - 117 U/L   AST 21 0 - 37 U/L   ALT 32 0 - 35 U/L   Total Protein 6.9 6.0 - 8.3 g/dL   Albumin 4.0 3.5 - 5.2 g/dL   GFR 20.59 >39.99 mL/min   Calcium  9.2 8.4 - 10.5 mg/dL  Hemoglobin J8r   Collection Time: 08/01/24  9:45 AM  Result Value Ref Range   Hgb A1c MFr Bld 6.6 (H) 4.6 - 6.5 %  Lipid panel   Collection Time: 08/01/24  9:45 AM  Result Value Ref Range   Cholesterol 130 0 - 200 mg/dL   Triglycerides 22.9 0.0 - 149.0 mg/dL   HDL 50.59 >60.99 mg/dL   VLDL 84.5 0.0 - 59.9 mg/dL   LDL Cholesterol 65 0 - 99 mg/dL   Total CHOL/HDL Ratio 3    NonHDL 80.89      COVID 19 screen:  No recent travel or known exposure to COVID19 The patient denies respiratory symptoms of COVID 19 at this time. The importance of social distancing was discussed today.   Assessment and Plan   Problem List Items Addressed This Visit     Atherosclerosis of autologous artery coronary artery bypass graft(s) with unspecified angina pectoris (Chronic)   On GLP1   in part for  CVD  risk reduction benefit.       Class 2 severe obesity with serious comorbidity and body mass index  (BMI) of 37.0 to 37.9 in adult    Weight loss on GLP1.. better with Mounjaro  then Trulicity .Encouraged exercise, weight loss, healthy eating habits.       Hyperlipidemia associated with type 2 diabetes mellitus (HCC) (Chronic)   Stable, chronic.  Continue current medication.   LDL at goal less than 70 on atorvastatin  40 mg daily      Hypertension associated with diabetes (HCC) (Chronic)   Stable, chronic.  Continue current medication.  Well-controlled on lisinopril  20 mg daily, amlodipine  10 mg p.o. daily       Microalbuminuria   Noted in August 2025.  Given improvement in diabetes control, patient would like to recheck in 3 months.  Currently on GLP-1 medication, ACE inhibitor, not on SGLT2 inhibitor      Type 2 diabetes mellitus with other circulatory complications HTN (HCC) - Primary (Chronic)   Chronic, well controlled.  Mounjaro  5 mg weekly      Relevant Orders   Comprehensive metabolic panel with GFR   Hemoglobin A1c   Microalbumin / creatinine urine ratio   Other Visit Diagnoses       Immunization due       Relevant Orders   Flu vaccine HIGH DOSE PF(Fluzone Trivalent) (Completed)           Greig Ring, MD

## 2024-08-09 NOTE — Assessment & Plan Note (Signed)
 On GLP1   in part for  CVD  risk reduction benefit.

## 2024-08-31 ENCOUNTER — Ambulatory Visit: Payer: Self-pay

## 2024-08-31 NOTE — Telephone Encounter (Signed)
 FYI Only or Action Required?: FYI only for provider: appointment scheduled on 09/05/24.  Patient was last seen in primary care on 08/09/2024 by Avelina Greig BRAVO, MD.  Called Nurse Triage reporting Leg Pain and Knee Pain.  Symptoms began about a month ago.  Interventions attempted: OTC medications: Tylenol Arthritis effective on pain and Rest, hydration, or home remedies.  Symptoms are: unchanged.  Triage Disposition: See PCP Within 2 Weeks  Patient/caregiver understands and will follow disposition?: Yes   Copied from CRM [id number]. Topic: Clinical - Red Word Triage >> Aug 31, 2024 11:26 AM Terri [name] wrote: Red Word that prompted transfer to Nurse Triage:  -started a month ago -pain in legs and knees -left knee has more pain -hurts to walk and move -painful to stand Reason for Disposition  Leg pain or muscle cramp is a chronic symptom (recurrent or ongoing AND present > 4 weeks)  Answer Assessment - Initial Assessment Questions Additional info: Offered next available appointment with pcp on 09/01/24 but she refused this appointment to predicted weather on 09/01/24. Scheduled with pcp on 09/05/24. Patient will call back for new or worsening symptoms.    1. ONSET: When did the pain start?      One month 2. LOCATION: Where is the pain located?      Bilateral legs and knees  3. PAIN: How bad is the pain?    (Scale 1-10; or mild, moderate, severe)     Left knee pain > right.  4. WORK OR EXERCISE: Has there been any recent work or exercise that involved this part of the body?      Difficulty ambulating due to pain, walking slower than normal.  5. CAUSE: What do you think is causing the leg pain?     unsure 6. OTHER SYMPTOMS: Do you have any other symptoms? (e.g., chest pain, back pain, breathing difficulty, swelling, rash, fever, numbness, weakness)     Denies all other symptoms.  7. PREGNANCY: Is there any chance you are pregnant? When was your last menstrual  period?  Protocols used: Leg Pain-A-AH

## 2024-09-05 ENCOUNTER — Encounter: Payer: Self-pay | Admitting: Family Medicine

## 2024-09-05 ENCOUNTER — Other Ambulatory Visit: Payer: Self-pay | Admitting: Family Medicine

## 2024-09-05 ENCOUNTER — Ambulatory Visit: Admitting: Family Medicine

## 2024-09-05 ENCOUNTER — Ambulatory Visit (INDEPENDENT_AMBULATORY_CARE_PROVIDER_SITE_OTHER)
Admission: RE | Admit: 2024-09-05 | Discharge: 2024-09-05 | Disposition: A | Source: Ambulatory Visit | Attending: Family Medicine | Admitting: Family Medicine

## 2024-09-05 ENCOUNTER — Ambulatory Visit: Payer: Self-pay | Admitting: Family Medicine

## 2024-09-05 VITALS — BP 130/68 | HR 72 | Temp 98.0°F | Ht 66.0 in | Wt 207.2 lb

## 2024-09-05 DIAGNOSIS — M25562 Pain in left knee: Secondary | ICD-10-CM

## 2024-09-05 MED ORDER — DICLOFENAC SODIUM 75 MG PO TBEC: 75.0000 mg | DELAYED_RELEASE_TABLET | Freq: Two times a day (BID) | ORAL | 0 refills | Status: DC

## 2024-09-05 NOTE — Progress Notes (Signed)
 Patient ID: Terri [name], female    DOB: 08-03-1951, 73 y.o.   MRN: [id number]  This visit was conducted in person.  BP 130/68   Pulse 72   Temp 98 F (36.7 C) (Oral)   Ht 5' 6 (1.676 m)   Wt 207 lb 4 oz (94 kg)   SpO2 99%   BMI 33.45 kg/m    CC:  Chief Complaint  Patient presents with   Knee Pain    Bilateral but left is worse    Subjective:   HPI: Terri [name] is a 73 y.o. female presenting on 09/05/2024 for Knee Pain (Bilateral but left is worse)   Bilateral leg and knee pain.SABRA left knee pain greater than right... ongoing in last few months worse in  month.   No fall or injury proceeding, no change in activity.  Pain in entire knee front and back.  Left knee swelling, no redness.  Occ radiates to left lower leg.   Pain going up stair , bending knee, lying down at night.   Has tried tylenol arthritis, Excedrin, voltaren  gel did not help much.   No past surgeries on knees.  No recent X-ray.  Has being seen by podiatry for plantar fasciitis.   Wt Readings from Last 3 Encounters:  09/05/24 207 lb 4 oz (94 kg)  08/09/24 207 lb 12.8 oz (94.3 kg)  05/09/24 216 lb (98 kg)           Relevant past medical, surgical, family and social history reviewed and updated as indicated. Interim medical history since our last visit reviewed. Allergies and medications reviewed and updated. Outpatient Medications Prior to Visit  Medication Sig Dispense Refill   amLODipine  (NORVASC ) 10 MG tablet TAKE 1 TABLET BY MOUTH DAILY 90 tablet 3   aspirin 81 MG tablet Take 81 mg by mouth daily.     atorvastatin  (LIPITOR) 40 MG tablet TAKE 1 TABLET BY MOUTH DAILY 90 tablet 1   Blood Glucose Monitoring Suppl (ONETOUCH VERIO IQ SYSTEM) w/Device KIT      Calcium  Carbonate (CALCIUM  600 PO) Take 1 tablet by mouth daily.     Cholecalciferol (VITAMIN D3 PO) Take 2,000 Units by mouth daily.     glucose blood (ONETOUCH VERIO) test strip USE TO CHECK BLOOD SUGAR DAILY  DX: E11.65 100  each 3   Lancets (ONETOUCH ULTRASOFT) lancets USE TO CHECK FASTING BLOOD SUGAR DAILY  DX: E11.65 100 each 3   lisinopril  (ZESTRIL ) 20 MG tablet TAKE ONE TABLET BY MOUTH DAILY 90 tablet 3   Lysine  500 MG TABS Take 1 tablet by mouth daily as needed.     metFORMIN  (GLUCOPHAGE -XR) 500 MG 24 hr tablet TAKE 2 TABLETS BY MOUTH DAILY WITH BREAKFAST 180 tablet 1   nortriptyline  (PAMELOR ) 10 MG capsule Take 1 capsule (10 mg total) by mouth at bedtime. 90 capsule 0   nystatin  cream (MYCOSTATIN ) Apply 1 application topically 2 (two) times daily as needed for dry skin. 30 g 0   tirzepatide  (MOUNJARO ) 5 MG/0.5ML Pen Inject 5 mg into the skin once a week. 6 mL 1   diclofenac  (VOLTAREN ) 75 MG EC tablet TAKE 1 TABLET BY MOUTH TWICE A DAY 30 tablet 0   No facility-administered medications prior to visit.     Per HPI unless specifically indicated in ROS section below Review of Systems  Constitutional:  Negative for fatigue and fever.  HENT:  Negative for congestion.   Eyes:  Negative for pain.  Respiratory:  Negative for cough and shortness of breath.   Cardiovascular:  Negative for chest pain, palpitations and leg swelling.  Gastrointestinal:  Negative for abdominal pain.  Genitourinary:  Negative for dysuria and vaginal bleeding.  Musculoskeletal:  Negative for back pain.  Neurological:  Negative for syncope, light-headedness and headaches.  Psychiatric/Behavioral:  Negative for dysphoric mood.    Objective:  BP 130/68   Pulse 72   Temp 98 F (36.7 C) (Oral)   Ht 5' 6 (1.676 m)   Wt 207 lb 4 oz (94 kg)   SpO2 99%   BMI 33.45 kg/m   Wt Readings from Last 3 Encounters:  09/05/24 207 lb 4 oz (94 kg)  08/09/24 207 lb 12.8 oz (94.3 kg)  05/09/24 216 lb (98 kg)      Physical Exam Constitutional:      General: She is not in acute distress.    Appearance: Normal appearance. She is well-developed. She is not ill-appearing or toxic-appearing.  HENT:     Head: Normocephalic.     Right Ear:  Hearing, tympanic membrane, ear canal and external ear normal. Tympanic membrane is not erythematous, retracted or bulging.     Left Ear: Hearing, tympanic membrane, ear canal and external ear normal. Tympanic membrane is not erythematous, retracted or bulging.     Nose: No mucosal edema or rhinorrhea.     Right Sinus: No maxillary sinus tenderness or frontal sinus tenderness.     Left Sinus: No maxillary sinus tenderness or frontal sinus tenderness.     Mouth/Throat:     Pharynx: Uvula midline.  Eyes:     General: Lids are normal. Lids are everted, no foreign bodies appreciated.     Conjunctiva/sclera: Conjunctivae normal.     Pupils: Pupils are equal, round, and reactive to light.  Neck:     Thyroid : No thyroid  mass or thyromegaly.     Vascular: No carotid bruit.     Trachea: Trachea normal.  Cardiovascular:     Rate and Rhythm: Normal rate and regular rhythm.     Pulses: Normal pulses.     Heart sounds: Normal heart sounds, S1 normal and S2 normal. No murmur heard.    No friction rub. No gallop.  Pulmonary:     Effort: Pulmonary effort is normal. No tachypnea or respiratory distress.     Breath sounds: Normal breath sounds. No decreased breath sounds, wheezing, rhonchi or rales.  Abdominal:     General: Bowel sounds are normal.     Palpations: Abdomen is soft.     Tenderness: There is no abdominal tenderness.  Musculoskeletal:     Cervical back: Normal range of motion and neck supple.     Right knee: No erythema or bony tenderness. Normal range of motion. No tenderness. No LCL laxity, MCL laxity, ACL laxity or PCL laxity. Normal alignment, normal meniscus and normal patellar mobility.     Left knee: Swelling present. No erythema or bony tenderness. Normal range of motion. No tenderness. No LCL laxity, MCL laxity, ACL laxity or PCL laxity.Normal alignment, normal meniscus and normal patellar mobility.     Comments: Neagtive McMurrarys bialteral  Bilateral varicose veins  Skin:     General: Skin is warm and dry.     Findings: No rash.  Neurological:     Mental Status: She is alert.  Psychiatric:        Mood and Affect: Mood is not anxious or depressed.        Speech: Speech normal.  Behavior: Behavior normal. Behavior is cooperative.        Thought Content: Thought content normal.        Judgment: Judgment normal.       Results for orders placed or performed in visit on 08/01/24  Comprehensive metabolic panel   Collection Time: 08/01/24  9:45 AM  Result Value Ref Range   Sodium 142 135 - 145 mEq/L   Potassium 3.9 3.5 - 5.1 mEq/L   Chloride 103 96 - 112 mEq/L   CO2 32 19 - 32 mEq/L   Glucose, Bld 85 70 - 99 mg/dL   BUN 10 6 - 23 mg/dL   Creatinine, Ser 9.25 0.40 - 1.20 mg/dL   Total Bilirubin 0.6 0.2 - 1.2 mg/dL   Alkaline Phosphatase 94 39 - 117 U/L   AST 21 0 - 37 U/L   ALT 32 0 - 35 U/L   Total Protein 6.9 6.0 - 8.3 g/dL   Albumin 4.0 3.5 - 5.2 g/dL   GFR 20.59 >39.99 mL/min   Calcium  9.2 8.4 - 10.5 mg/dL  Hemoglobin J8r   Collection Time: 08/01/24  9:45 AM  Result Value Ref Range   Hgb A1c MFr Bld 6.6 (H) 4.6 - 6.5 %  Lipid panel   Collection Time: 08/01/24  9:45 AM  Result Value Ref Range   Cholesterol 130 0 - 200 mg/dL   Triglycerides 22.9 0.0 - 149.0 mg/dL   HDL 50.59 >60.99 mg/dL   VLDL 84.5 0.0 - 59.9 mg/dL   LDL Cholesterol 65 0 - 99 mg/dL   Total CHOL/HDL Ratio 3    NonHDL 80.89     Assessment and Plan  Acute pain of left knee Assessment & Plan: Acute, most consistent with bilateral osteoarthritis greater in the left knee than right.  Will evaluate with plain film. Given failure of topical diclofenac  and Tylenol will change to Voltaren  oral 75 mg p.o. twice daily x 1 to 2 weeks.  She can also start glucosamine 500 mg 1-3 times a day.  If pain not improving she may need to consider referral to sports medicine/orthopedics for possible steroid injection in knee.  Return and ER precautions provided.   Other orders -      Diclofenac  Sodium; Take 1 tablet (75 mg total) by mouth 2 (two) times daily.  Dispense: 30 tablet; Refill: 0    No follow-ups on file.   Greig Ring, MD

## 2024-09-05 NOTE — Assessment & Plan Note (Signed)
 Acute, most consistent with bilateral osteoarthritis greater in the left knee than right.  Will evaluate with plain film. Given failure of topical diclofenac  and Tylenol will change to Voltaren  oral 75 mg p.o. twice daily x 1 to 2 weeks.  She can also start glucosamine 500 mg 1-3 times a day.  If pain not improving she may need to consider referral to sports medicine/orthopedics for possible steroid injection in knee.  Return and ER precautions provided.

## 2024-09-07 NOTE — Progress Notes (Signed)
 Terri [name]                                          MRN: [id number]   09/07/2024   The [facility] Quality Team Specialist reviewed this patient medical record for the purposes of chart review for care gap closure. The following were reviewed: abstraction for care gap closure-kidney health evaluation for diabetes:eGFR  and uACR.    [facility] Quality Team

## 2024-10-09 ENCOUNTER — Other Ambulatory Visit: Payer: Self-pay | Admitting: Family Medicine

## 2024-10-09 NOTE — Telephone Encounter (Signed)
 Last office visit 09/05/2024 for Left Knee Pain.  Last refilled 09/05/24 for #30 with no refills.  Next Appt: DM 11/10/2024.

## 2024-10-26 ENCOUNTER — Other Ambulatory Visit: Payer: Self-pay | Admitting: Family Medicine

## 2024-10-30 ENCOUNTER — Ambulatory Visit

## 2024-10-30 ENCOUNTER — Telehealth: Payer: Self-pay | Admitting: Family Medicine

## 2024-10-31 ENCOUNTER — Other Ambulatory Visit: Payer: Self-pay | Admitting: Family Medicine

## 2024-10-31 MED ORDER — MOUNJARO 5 MG/0.5ML ~~LOC~~ SOAJ: 5.0000 mg | SUBCUTANEOUS | 1 refills | Status: AC

## 2024-10-31 MED ORDER — METFORMIN HCL ER 500 MG PO TB24: 1000.0000 mg | ORAL_TABLET | Freq: Every day | ORAL | 0 refills | Status: AC

## 2024-10-31 MED ORDER — LISINOPRIL 20 MG PO TABS: 20.0000 mg | ORAL_TABLET | Freq: Every day | ORAL | 0 refills | Status: AC

## 2024-10-31 MED ORDER — ATORVASTATIN CALCIUM 40 MG PO TABS: 40.0000 mg | ORAL_TABLET | Freq: Every day | ORAL | 1 refills | Status: AC

## 2024-10-31 MED ORDER — AMLODIPINE BESYLATE 10 MG PO TABS: 10.0000 mg | ORAL_TABLET | Freq: Every day | ORAL | 0 refills | Status: AC

## 2024-10-31 NOTE — Addendum Note (Signed)
 Addended by: WENDELL ARLAND RAMAN on: 10/31/2024 01:12 PM   Modules accepted: Orders

## 2024-11-01 MED ORDER — DICLOFENAC SODIUM 75 MG PO TBEC: 75.0000 mg | DELAYED_RELEASE_TABLET | Freq: Two times a day (BID) | ORAL | 0 refills | Status: AC

## 2024-11-03 ENCOUNTER — Other Ambulatory Visit

## 2024-11-03 ENCOUNTER — Ambulatory Visit: Payer: Self-pay | Admitting: Family Medicine

## 2024-11-03 DIAGNOSIS — E1159 Type 2 diabetes mellitus with other circulatory complications: Secondary | ICD-10-CM

## 2024-11-03 LAB — COMPREHENSIVE METABOLIC PANEL WITH GFR
ALT: 28 U/L (ref 3–35)
AST: 20 U/L (ref 5–37)
Albumin: 4.2 g/dL (ref 3.5–5.2)
Alkaline Phosphatase: 99 U/L (ref 39–117)
BUN: 12 mg/dL (ref 6–23)
CO2: 32 meq/L (ref 19–32)
Calcium: 9.3 mg/dL (ref 8.4–10.5)
Chloride: 104 meq/L (ref 96–112)
Creatinine, Ser: 0.7 mg/dL (ref 0.40–1.20)
GFR: 84.72 mL/min
Glucose, Bld: 90 mg/dL (ref 70–99)
Potassium: 4.1 meq/L (ref 3.5–5.1)
Sodium: 142 meq/L (ref 135–145)
Total Bilirubin: 0.8 mg/dL (ref 0.2–1.2)
Total Protein: 6.6 g/dL (ref 6.0–8.3)

## 2024-11-03 LAB — MICROALBUMIN / CREATININE URINE RATIO
Creatinine,U: 89.2 mg/dL
Microalb Creat Ratio: 34.4 mg/g — ABNORMAL HIGH (ref 0.0–30.0)
Microalb, Ur: 3.1 mg/dL — ABNORMAL HIGH (ref 0.7–1.9)

## 2024-11-03 LAB — HEMOGLOBIN A1C: Hgb A1c MFr Bld: 6.3 % (ref 4.6–6.5)

## 2024-11-03 NOTE — Progress Notes (Signed)
 No critical labs need to be addressed urgently. We will discuss labs in detail at upcoming office visit.

## 2024-11-10 ENCOUNTER — Encounter: Admitting: Family Medicine

## 2024-11-24 ENCOUNTER — Ambulatory Visit
